# Patient Record
Sex: Female | Born: 1935 | Race: White | Hispanic: No | Smoking: Never smoker
Health system: Southern US, Community
[De-identification: ages and names within clinical notes are randomized; demographics above are authoritative.]

## PROBLEM LIST (undated history)

## (undated) DIAGNOSIS — F418 Other specified anxiety disorders: Secondary | ICD-10-CM

## (undated) DIAGNOSIS — D649 Anemia, unspecified: Secondary | ICD-10-CM

## (undated) DIAGNOSIS — C801 Malignant (primary) neoplasm, unspecified: Secondary | ICD-10-CM

## (undated) DIAGNOSIS — I10 Essential (primary) hypertension: Secondary | ICD-10-CM

## (undated) DIAGNOSIS — D62 Acute posthemorrhagic anemia: Secondary | ICD-10-CM

## (undated) DIAGNOSIS — E7800 Pure hypercholesterolemia, unspecified: Secondary | ICD-10-CM

## (undated) DIAGNOSIS — I351 Nonrheumatic aortic (valve) insufficiency: Secondary | ICD-10-CM

## (undated) DIAGNOSIS — Z952 Presence of prosthetic heart valve: Principal | ICD-10-CM

## (undated) DIAGNOSIS — I712 Thoracic aortic aneurysm, without rupture, unspecified: Secondary | ICD-10-CM

## (undated) DIAGNOSIS — D696 Thrombocytopenia, unspecified: Secondary | ICD-10-CM

## (undated) DIAGNOSIS — I480 Paroxysmal atrial fibrillation: Secondary | ICD-10-CM

## (undated) HISTORY — DX: Thrombocytopenia, unspecified: D69.6

## (undated) HISTORY — DX: Presence of prosthetic heart valve: Z95.2

## (undated) HISTORY — DX: Other specified anxiety disorders: F41.8

## (undated) HISTORY — DX: Pure hypercholesterolemia, unspecified: E78.00

## (undated) HISTORY — DX: Anemia, unspecified: D64.9

## (undated) HISTORY — DX: Malignant (primary) neoplasm, unspecified: C80.1

## (undated) HISTORY — DX: Thoracic aortic aneurysm, without rupture, unspecified: I71.20

## (undated) HISTORY — DX: Essential (primary) hypertension: I10

## (undated) HISTORY — DX: Acute posthemorrhagic anemia: D62

## (undated) HISTORY — DX: Nonrheumatic aortic (valve) insufficiency: I35.1

## (undated) HISTORY — DX: Paroxysmal atrial fibrillation: I48.0

## (undated) HISTORY — DX: Thoracic aortic aneurysm, without rupture: I71.2

## (undated) HISTORY — PX: ROTATOR CUFF REPAIR: SHX139

---

## 1971-10-13 HISTORY — PX: COLON SURGERY: SHX602

## 1998-04-09 ENCOUNTER — Observation Stay (HOSPITAL_COMMUNITY): Admission: RE | Admit: 1998-04-09 | Discharge: 1998-04-10 | Payer: Self-pay | Admitting: Specialist

## 1999-07-28 ENCOUNTER — Ambulatory Visit: Admission: RE | Admit: 1999-07-28 | Discharge: 1999-07-28 | Payer: Self-pay | Admitting: Rheumatology

## 1999-09-05 ENCOUNTER — Encounter: Admission: RE | Admit: 1999-09-05 | Discharge: 1999-09-05 | Payer: Self-pay | Admitting: Rheumatology

## 1999-09-05 ENCOUNTER — Encounter: Payer: Self-pay | Admitting: Rheumatology

## 2000-12-08 ENCOUNTER — Encounter: Payer: Self-pay | Admitting: Internal Medicine

## 2000-12-08 ENCOUNTER — Encounter: Admission: RE | Admit: 2000-12-08 | Discharge: 2000-12-08 | Payer: Self-pay | Admitting: Internal Medicine

## 2000-12-24 ENCOUNTER — Encounter: Admission: RE | Admit: 2000-12-24 | Discharge: 2001-02-01 | Payer: Self-pay | Admitting: Orthopedic Surgery

## 2002-08-15 ENCOUNTER — Encounter: Payer: Self-pay | Admitting: Internal Medicine

## 2002-08-15 ENCOUNTER — Encounter: Admission: RE | Admit: 2002-08-15 | Discharge: 2002-08-15 | Payer: Self-pay | Admitting: Internal Medicine

## 2003-02-06 ENCOUNTER — Encounter: Admission: RE | Admit: 2003-02-06 | Discharge: 2003-02-06 | Payer: Self-pay | Admitting: Internal Medicine

## 2003-02-06 ENCOUNTER — Encounter: Payer: Self-pay | Admitting: Internal Medicine

## 2003-08-14 ENCOUNTER — Encounter: Admission: RE | Admit: 2003-08-14 | Discharge: 2003-08-14 | Payer: Self-pay | Admitting: Internal Medicine

## 2003-11-05 ENCOUNTER — Encounter: Admission: RE | Admit: 2003-11-05 | Discharge: 2003-11-05 | Payer: Self-pay | Admitting: Psychiatry

## 2004-08-19 ENCOUNTER — Ambulatory Visit: Payer: Self-pay | Admitting: Hematology & Oncology

## 2005-02-12 ENCOUNTER — Encounter: Admission: RE | Admit: 2005-02-12 | Discharge: 2005-02-12 | Payer: Self-pay | Admitting: Internal Medicine

## 2006-01-19 ENCOUNTER — Encounter: Admission: RE | Admit: 2006-01-19 | Discharge: 2006-01-19 | Payer: Self-pay | Admitting: Internal Medicine

## 2006-09-27 ENCOUNTER — Encounter: Admission: RE | Admit: 2006-09-27 | Discharge: 2006-09-27 | Payer: Self-pay | Admitting: Gastroenterology

## 2006-12-23 ENCOUNTER — Encounter: Admission: RE | Admit: 2006-12-23 | Discharge: 2006-12-23 | Payer: Self-pay | Admitting: Gastroenterology

## 2009-02-28 ENCOUNTER — Ambulatory Visit (HOSPITAL_COMMUNITY): Admission: RE | Admit: 2009-02-28 | Discharge: 2009-02-28 | Payer: Self-pay | Admitting: *Deleted

## 2009-02-28 ENCOUNTER — Encounter (INDEPENDENT_AMBULATORY_CARE_PROVIDER_SITE_OTHER): Payer: Self-pay | Admitting: *Deleted

## 2009-03-04 ENCOUNTER — Ambulatory Visit (HOSPITAL_COMMUNITY): Admission: RE | Admit: 2009-03-04 | Discharge: 2009-03-04 | Payer: Self-pay | Admitting: Cardiology

## 2009-03-05 ENCOUNTER — Ambulatory Visit: Payer: Self-pay | Admitting: Surgery

## 2009-03-18 ENCOUNTER — Encounter: Payer: Self-pay | Admitting: Surgery

## 2009-03-20 ENCOUNTER — Ambulatory Visit: Payer: Self-pay | Admitting: Surgery

## 2009-03-20 ENCOUNTER — Encounter: Payer: Self-pay | Admitting: Surgery

## 2009-03-20 ENCOUNTER — Inpatient Hospital Stay (HOSPITAL_COMMUNITY): Admission: RE | Admit: 2009-03-20 | Discharge: 2009-03-27 | Payer: Self-pay | Admitting: Surgery

## 2009-03-20 HISTORY — PX: AORTIC VALVE REPLACEMENT: SHX41

## 2009-04-02 ENCOUNTER — Ambulatory Visit: Payer: Self-pay | Admitting: Thoracic Surgery (Cardiothoracic Vascular Surgery)

## 2009-04-22 ENCOUNTER — Encounter: Admission: RE | Admit: 2009-04-22 | Discharge: 2009-04-22 | Payer: Self-pay | Admitting: Surgery

## 2009-04-22 ENCOUNTER — Ambulatory Visit: Payer: Self-pay | Admitting: Surgery

## 2009-05-27 ENCOUNTER — Encounter (HOSPITAL_COMMUNITY): Admission: RE | Admit: 2009-05-27 | Discharge: 2009-08-25 | Payer: Self-pay | Admitting: *Deleted

## 2009-08-26 ENCOUNTER — Encounter (HOSPITAL_COMMUNITY): Admission: RE | Admit: 2009-08-26 | Discharge: 2009-10-08 | Payer: Self-pay | Admitting: *Deleted

## 2010-04-18 IMAGING — CR DG CHEST 1V PORT
1 series · 1 of 1 positions shown · non-contrast
Comparison: Chest radiograph 03/20/2009 at 7142 hours

CLINICAL DATA: Ascending aortic aneurysm

PORTABLE CHEST - 1 VIEW

[AP]
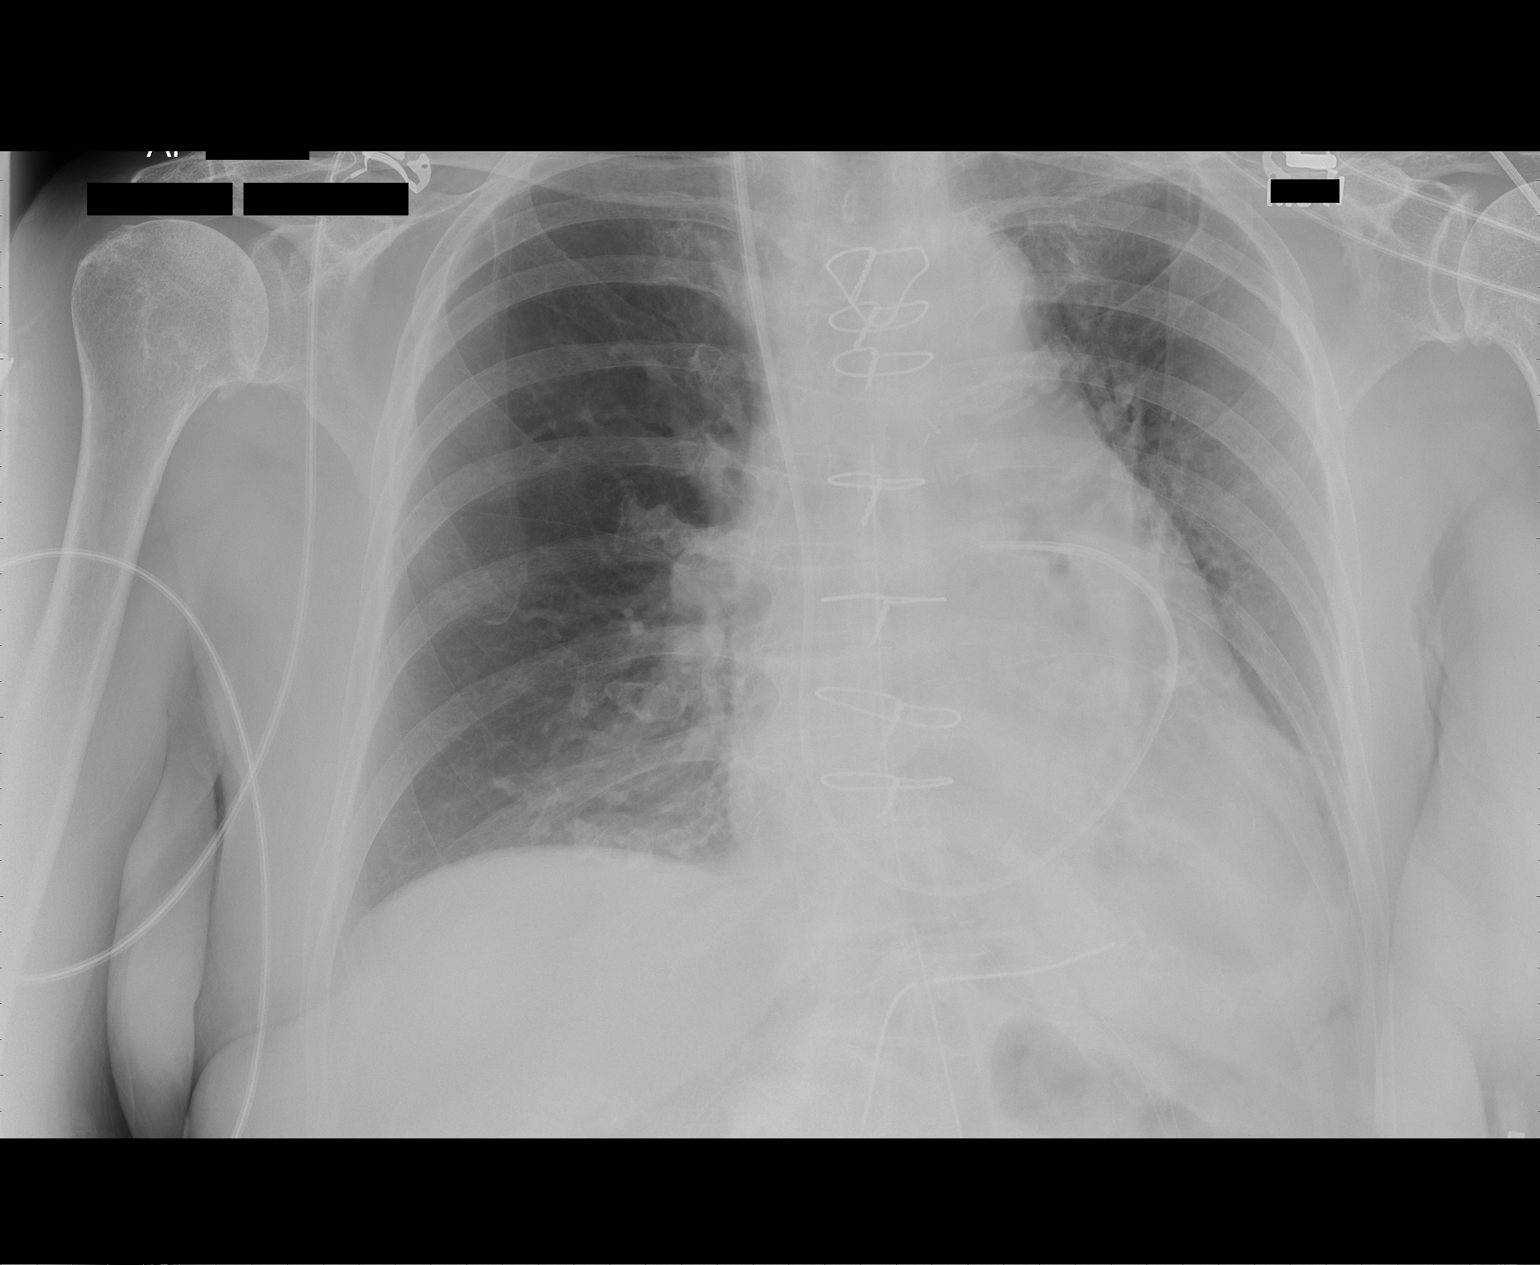

[1 of 1 positions shown; findings below may reference images not displayed]

FINDINGS: Interval removal of endotracheal tube with no increase in
atelectasis.  Swan-Ganz catheter remains with tip in the right main
pulmonary artery.  Mediastinal drains in place.  No evidence
pneumothorax.  No pulmonary edema.  Left basilar atelectasis.
IMPRESSION: Interval extubation with no increase in atelectasis.

## 2010-07-21 ENCOUNTER — Encounter: Admission: RE | Admit: 2010-07-21 | Discharge: 2010-07-21 | Payer: Self-pay | Admitting: Internal Medicine

## 2010-07-30 ENCOUNTER — Ambulatory Visit: Payer: Self-pay | Admitting: Cardiology

## 2010-08-15 ENCOUNTER — Encounter: Payer: Self-pay | Admitting: Cardiology

## 2010-08-15 ENCOUNTER — Ambulatory Visit (HOSPITAL_COMMUNITY): Admission: RE | Admit: 2010-08-15 | Discharge: 2010-08-15 | Payer: Self-pay | Admitting: Cardiology

## 2010-08-15 ENCOUNTER — Ambulatory Visit: Payer: Self-pay

## 2010-08-15 ENCOUNTER — Ambulatory Visit: Payer: Self-pay | Admitting: Internal Medicine

## 2010-09-16 ENCOUNTER — Ambulatory Visit (HOSPITAL_COMMUNITY)
Admission: RE | Admit: 2010-09-16 | Discharge: 2010-09-16 | Payer: Self-pay | Source: Home / Self Care | Admitting: Gastroenterology

## 2010-10-30 ENCOUNTER — Encounter
Admission: RE | Admit: 2010-10-30 | Discharge: 2010-10-30 | Payer: Self-pay | Source: Home / Self Care | Attending: Internal Medicine | Admitting: Internal Medicine

## 2010-11-01 ENCOUNTER — Encounter: Payer: Self-pay | Admitting: Internal Medicine

## 2010-11-02 ENCOUNTER — Encounter: Payer: Self-pay | Admitting: Internal Medicine

## 2010-11-11 ENCOUNTER — Other Ambulatory Visit: Payer: Self-pay | Admitting: Interventional Radiology

## 2010-11-11 ENCOUNTER — Encounter
Admission: RE | Admit: 2010-11-11 | Discharge: 2010-11-11 | Payer: Self-pay | Source: Home / Self Care | Attending: Internal Medicine | Admitting: Internal Medicine

## 2010-11-11 ENCOUNTER — Other Ambulatory Visit (HOSPITAL_COMMUNITY)
Admission: RE | Admit: 2010-11-11 | Payer: Medicare Other | Source: Ambulatory Visit | Admitting: Interventional Radiology

## 2010-11-11 ENCOUNTER — Other Ambulatory Visit (HOSPITAL_COMMUNITY)
Admission: RE | Admit: 2010-11-11 | Discharge: 2010-11-11 | Disposition: A | Discharge status: Home / Self Care | Payer: Medicare Other | Source: Ambulatory Visit | Attending: Interventional Radiology | Admitting: Interventional Radiology

## 2010-11-11 DIAGNOSIS — E049 Nontoxic goiter, unspecified: Secondary | ICD-10-CM | POA: Insufficient documentation

## 2011-01-19 LAB — TYPE AND SCREEN
ABO/RH(D): A POS
Antibody Screen: NEGATIVE

## 2011-01-19 LAB — BASIC METABOLIC PANEL
BUN: 12 mg/dL (ref 6–23)
BUN: 15 mg/dL (ref 6–23)
CO2: 28 mEq/L (ref 19–32)
CO2: 27 mEq/L (ref 19–32)
Calcium: 8.5 mg/dL (ref 8.4–10.5)
Calcium: 7.7 mg/dL — ABNORMAL LOW (ref 8.4–10.5)
Calcium: 8.1 mg/dL — ABNORMAL LOW (ref 8.4–10.5)
Calcium: 8.5 mg/dL (ref 8.4–10.5)
Chloride: 108 mEq/L (ref 96–112)
Chloride: 101 mEq/L (ref 96–112)
Creatinine, Ser: 0.86 mg/dL (ref 0.4–1.2)
Creatinine, Ser: 0.71 mg/dL (ref 0.4–1.2)
Creatinine, Ser: 0.75 mg/dL (ref 0.4–1.2)
GFR calc Af Amer: >60 mL/min (ref >60)
GFR calc non Af Amer: >60 mL/min (ref >60)
GFR calc non Af Amer: >60 mL/min (ref >60)
GFR calc non Af Amer: >60 mL/min (ref >60)
Glucose, Bld: 110 mg/dL — ABNORMAL HIGH (ref 70–99)
Glucose, Bld: 119 mg/dL — ABNORMAL HIGH (ref 70–99)
Glucose, Bld: 125 mg/dL — ABNORMAL HIGH (ref 70–99)
Glucose, Bld: 153 mg/dL — ABNORMAL HIGH (ref 70–99)
Potassium: 3.8 mEq/L (ref 3.5–5.1)
Sodium: 140 mEq/L (ref 135–145)

## 2011-01-19 LAB — BLOOD GAS, ARTERIAL
Bicarbonate: 24.8 mEq/L — ABNORMAL HIGH (ref 20.0–24.0)
FIO2: 0.21 %
TCO2: 26.1 mmol/L (ref 0–100)
pCO2 arterial: 41 mmHg (ref 35.0–45.0)
pO2, Arterial: 81.6 mmHg (ref 80.0–100.0)

## 2011-01-19 LAB — POCT I-STAT 4, (NA,K, GLUC, HGB,HCT)
Glucose, Bld: 119 mg/dL — ABNORMAL HIGH (ref 70–99)
Glucose, Bld: 122 mg/dL — ABNORMAL HIGH (ref 70–99)
Glucose, Bld: 148 mg/dL — ABNORMAL HIGH (ref 70–99)
Glucose, Bld: 148 mg/dL — ABNORMAL HIGH (ref 70–99)
Glucose, Bld: 93 mg/dL (ref 70–99)
Glucose, Bld: 96 mg/dL (ref 70–99)
HCT: 20 % — ABNORMAL LOW (ref 36.0–46.0)
HCT: 20 % — ABNORMAL LOW (ref 36.0–46.0)
HCT: 21 % — ABNORMAL LOW (ref 36.0–46.0)
HCT: 23 % — ABNORMAL LOW (ref 36.0–46.0)
HCT: 24 % — ABNORMAL LOW (ref 36.0–46.0)
HCT: 25 % — ABNORMAL LOW (ref 36.0–46.0)
HCT: 30 % — ABNORMAL LOW (ref 36.0–46.0)
HCT: 31 % — ABNORMAL LOW (ref 36.0–46.0)
HCT: 32 % — ABNORMAL LOW (ref 36.0–46.0)
Hemoglobin: 10.5 g/dL — ABNORMAL LOW (ref 12.0–15.0)
Hemoglobin: 6.8 g/dL — CL (ref 12.0–15.0)
Hemoglobin: 7.1 g/dL — CL (ref 12.0–15.0)
Hemoglobin: 7.1 g/dL — CL (ref 12.0–15.0)
Hemoglobin: 7.8 g/dL — CL (ref 12.0–15.0)
Hemoglobin: 8.2 g/dL — ABNORMAL LOW (ref 12.0–15.0)
Hemoglobin: 8.5 g/dL — ABNORMAL LOW (ref 12.0–15.0)
Potassium: 3.4 mEq/L — ABNORMAL LOW (ref 3.5–5.1)
Potassium: 3.4 mEq/L — ABNORMAL LOW (ref 3.5–5.1)
Potassium: 3.5 mEq/L (ref 3.5–5.1)
Potassium: 3.8 mEq/L (ref 3.5–5.1)
Potassium: 3.9 mEq/L (ref 3.5–5.1)
Potassium: 4 mEq/L (ref 3.5–5.1)
Potassium: 4.3 mEq/L (ref 3.5–5.1)
Sodium: 135 mEq/L (ref 135–145)
Sodium: 138 mEq/L (ref 135–145)
Sodium: 139 mEq/L (ref 135–145)
Sodium: 141 mEq/L (ref 135–145)
Sodium: 143 mEq/L (ref 135–145)
Sodium: 143 mEq/L (ref 135–145)
Sodium: 144 mEq/L (ref 135–145)

## 2011-01-19 LAB — CBC
HCT: 27.4 % — ABNORMAL LOW (ref 36.0–46.0)
HCT: 28.8 % — ABNORMAL LOW (ref 36.0–46.0)
Hemoglobin: 8.7 g/dL — ABNORMAL LOW (ref 12.0–15.0)
Hemoglobin: 8.9 g/dL — ABNORMAL LOW (ref 12.0–15.0)
Hemoglobin: 9.4 g/dL — ABNORMAL LOW (ref 12.0–15.0)
MCHC: 33.5 g/dL (ref 30.0–36.0)
MCHC: 33.6 g/dL (ref 30.0–36.0)
MCHC: 33.8 g/dL (ref 30.0–36.0)
MCV: 85.3 fL (ref 78.0–100.0)
MCV: 86.2 fL (ref 78.0–100.0)
Platelets: 238 10*3/uL (ref 150–400)
Platelets: 123 10*3/uL — ABNORMAL LOW (ref 150–400)
Platelets: 95 10*3/uL — ABNORMAL LOW (ref 150–400)
Platelets: 98 10*3/uL — ABNORMAL LOW (ref 150–400)
RBC: 3.09 MIL/uL — ABNORMAL LOW (ref 3.87–5.11)
RBC: 2.98 MIL/uL — ABNORMAL LOW (ref 3.87–5.11)
RBC: 3.26 MIL/uL — ABNORMAL LOW (ref 3.87–5.11)
RBC: 4.15 MIL/uL (ref 3.87–5.11)
RDW: 14.2 % (ref 11.5–15.5)
RDW: 13.9 % (ref 11.5–15.5)
RDW: 13.9 % (ref 11.5–15.5)
RDW: 14.2 % (ref 11.5–15.5)
RDW: 14.7 % (ref 11.5–15.5)
WBC: 10.6 10*3/uL — ABNORMAL HIGH (ref 4.0–10.5)
WBC: 5 10*3/uL (ref 4.0–10.5)
WBC: 7.1 10*3/uL (ref 4.0–10.5)

## 2011-01-19 LAB — POCT I-STAT 3, ART BLOOD GAS (G3+)
Acid-Base Excess: 1 mmol/L (ref 0.0–2.0)
Acid-base deficit: 3 mmol/L — ABNORMAL HIGH (ref 0.0–2.0)
Bicarbonate: 21.4 mEq/L (ref 20.0–24.0)
Bicarbonate: 24.4 mEq/L — ABNORMAL HIGH (ref 20.0–24.0)
O2 Saturation: 98 %
Patient temperature: 36.3
TCO2: 25 mmol/L (ref 0–100)
TCO2: 27 mmol/L (ref 0–100)
pCO2 arterial: 39.5 mmHg (ref 35.0–45.0)
pCO2 arterial: 41.8 mmHg (ref 35.0–45.0)
pCO2 arterial: 46.3 mmHg — ABNORMAL HIGH (ref 35.0–45.0)
pH, Arterial: 7.345 — ABNORMAL LOW (ref 7.350–7.400)
pH, Arterial: 7.351 (ref 7.350–7.400)
pH, Arterial: 7.358 (ref 7.350–7.400)
pH, Arterial: 7.378 (ref 7.350–7.400)
pH, Arterial: 7.437 — ABNORMAL HIGH (ref 7.350–7.400)
pO2, Arterial: 110 mmHg — ABNORMAL HIGH (ref 80.0–100.0)
pO2, Arterial: 110 mmHg — ABNORMAL HIGH (ref 80.0–100.0)
pO2, Arterial: 270 mmHg — ABNORMAL HIGH (ref 80.0–100.0)
pO2, Arterial: 272 mmHg — ABNORMAL HIGH (ref 80.0–100.0)

## 2011-01-19 LAB — URINE CULTURE
Colony Count: 50000
Special Requests: NEGATIVE

## 2011-01-19 LAB — URINALYSIS, ROUTINE W REFLEX MICROSCOPIC
Glucose, UA: NEGATIVE mg/dL
Hgb urine dipstick: NEGATIVE
Specific Gravity, Urine: 1.012 (ref 1.005–1.030)
Urobilinogen, UA: 0.2 mg/dL (ref 0.0–1.0)

## 2011-01-19 LAB — COMPREHENSIVE METABOLIC PANEL
ALT: 15 U/L (ref 0–35)
Alkaline Phosphatase: 68 U/L (ref 39–117)
Calcium: 9.5 mg/dL (ref 8.4–10.5)
Creatinine, Ser: 0.71 mg/dL (ref 0.4–1.2)
Glucose, Bld: 112 mg/dL — ABNORMAL HIGH (ref 70–99)
Potassium: 4 mEq/L (ref 3.5–5.1)
Sodium: 142 mEq/L (ref 135–145)
Total Protein: 6.7 g/dL (ref 6.0–8.3)

## 2011-01-19 LAB — POCT I-STAT, CHEM 8
Calcium, Ion: 1.08 mmol/L — ABNORMAL LOW (ref 1.12–1.32)
Glucose, Bld: 149 mg/dL — ABNORMAL HIGH (ref 70–99)
HCT: 30 % — ABNORMAL LOW (ref 36.0–46.0)
Hemoglobin: 10.2 g/dL — ABNORMAL LOW (ref 12.0–15.0)
TCO2: 19 mmol/L (ref 0–100)

## 2011-01-19 LAB — PROTIME-INR
INR: 1 (ref 0.00–1.49)
INR: 1.7 — ABNORMAL HIGH (ref 0.00–1.49)
Prothrombin Time: 13.2 seconds (ref 11.6–15.2)
Prothrombin Time: 21 seconds — ABNORMAL HIGH (ref 11.6–15.2)

## 2011-01-19 LAB — CREATININE, SERUM
Creatinine, Ser: 0.69 mg/dL (ref 0.4–1.2)
GFR calc non Af Amer: >60 mL/min (ref >60)

## 2011-01-19 LAB — URINALYSIS, MICROSCOPIC ONLY
Bilirubin Urine: NEGATIVE
Ketones, ur: NEGATIVE mg/dL
Nitrite: NEGATIVE
Protein, ur: NEGATIVE mg/dL
Urobilinogen, UA: 0.2 mg/dL (ref 0.0–1.0)

## 2011-01-19 LAB — POCT I-STAT 3, VENOUS BLOOD GAS (G3P V)
Bicarbonate: 23.7 mEq/L (ref 20.0–24.0)
pCO2, Ven: 44.5 mmHg — ABNORMAL LOW (ref 45.0–50.0)
pH, Ven: 7.335 — ABNORMAL HIGH (ref 7.250–7.300)
pO2, Ven: 123 mmHg — ABNORMAL HIGH (ref 30.0–45.0)

## 2011-01-19 LAB — GLUCOSE, CAPILLARY
Glucose-Capillary: 104 mg/dL — ABNORMAL HIGH (ref 70–99)
Glucose-Capillary: 89 mg/dL (ref 70–99)

## 2011-01-19 LAB — URINE MICROSCOPIC-ADD ON

## 2011-01-19 LAB — HEMOGLOBIN AND HEMATOCRIT, BLOOD: Hemoglobin: 7.2 g/dL — CL (ref 12.0–15.0)

## 2011-01-19 LAB — HEMOGLOBIN A1C
Hgb A1c MFr Bld: 5.8 % (ref 4.6–6.1)
Mean Plasma Glucose: 120 mg/dL

## 2011-01-19 LAB — ABO/RH: ABO/RH(D): A POS

## 2011-01-19 LAB — APTT: aPTT: 49 seconds — ABNORMAL HIGH (ref 24–37)

## 2011-01-19 LAB — PLATELET COUNT: Platelets: 143 10*3/uL — ABNORMAL LOW (ref 150–400)

## 2011-02-24 NOTE — Assessment & Plan Note (Signed)
OFFICE VISIT   [name], [name] MYREN  DOB:  15-Dec-1929                                        April 02, 2009  CHART #:  [id number]   The patient is a 77 year old woman, who underwent aortic root  replacement with a Medtronic Freestyle aortic root and replacement of an  ascending aneurysm with a Hemashield graft on March 20, 2009.  She had  some atrial fibrillation and was discharged home on amiodarone,  otherwise had a relatively unremarkable postoperative course.  Her home  health nurse today noted swelling and redness on her left forearm from  an IV site and she presents for evaluation.  She said she had not really  noticed it.  She denies any fevers, chills, or sweats but this is  somewhat tender to palpation.   PHYSICAL EXAMINATION:  GENERAL:  The patient is a 77 year old woman in  no acute distress.  VITAL SIGNS:  Blood pressure is 138/60, pulse 75, respirations are 18,  and oxygen saturation is 96% on room air.  Temperature is 97.9.  EXTREMITIES:  On the radial aspect of her left forearm, there is  approximately a 2 cm raised swollen area.  It is not frankly fluctuant.  There is some surrounding erythema.  It is mildly tender.   IMPRESSION:  Infected thrombophlebitis at an intravenous site from a  previous hospitalization.  We are going to start her on Keflex 500 mg  p.o. t.i.d. for 10 days.  She was instructed to take all of the  antibiotics even if the redness and swelling disappear before the course  has been completed.  She does know to call if there is any increasing  swelling, redness, or tenderness and also to call if there is no  improvement over the next 48-72 hours.  There is nothing to incise and  drain at this point in time, but the question is that it could  eventually require that.  I did advise her to call immediately if she  had any fevers or chills.  In addition to the antibiotics, she will do  warm compresses 3 times a day.  If this  resolves without further issues,  she will followup her  regular scheduled appointment with Dr. Laneta Simmers.  If she has any  additional issues, she will contact us in the meantime.   Salvatore Decent Dorris Fetch, M.D.  Electronically Signed   SCH/MEDQ  D:  04/02/2009  T:  04/03/2009  Job:  [id number]   cc:   Evelene Croon, M.D.  Elmore Guise., M.D.  Soyla Murphy. Renne Crigler, M.D.

## 2011-02-24 NOTE — Assessment & Plan Note (Signed)
OFFICE VISIT   [name], [name] [name]  DOB:  10/21/1929                                        April 22, 2009  CHART #:  [id number]   HISTORY:  The patient is seen in routine postoperative office visit  following her replacement of the aortic valve and aortic root using a 21-  mm Medtronic Freestyle Aortic Root Heart Valve Prosthesis with  reimplantation of the coronary arteries, resection and grafting of  ascending aortic aneurysm using a 28-mm Hemashield tube graft.  This was  performed on 03/20/2009 by Dr. Laneta Simmers.  This was done for a 6-cm  ascending aortic aneurysm with severe aortic insufficiency.   Currently, she feels as though she is making a very steady progress,  although, she does admit to being somewhat frustrated, because she feels  fairly tired most of the time.  She did have an acute blood loss anemia  and has been taking iron, but this does cause some gastric discomfort.  She is not having any shortness of breath.  She denies any anginal  equivalents.  She has no palpitations.  She denies peripheral edema.  She has had no symptoms of presyncope or syncope.  She has not seen her  cardiologist, but is scheduled to see Dr. Reyes [name] on Friday of this week.  Her pain is under good control and she is not requiring any medications  at this time.  She did have some difficulty with phlebitis and was seen  in the office and given a course of antibiotics.  This has resolved  entirely.   CHEST X-RAY:  A chest x-ray was obtained on today's date.  It shows a  very small effusion in the left base with some associated atelectasis.   PHYSICAL EXAMINATION:  VITAL SIGNS:  Blood pressure is 137/71, pulse 73  and regular, respirations 18 and unlabored, and oxygen saturation is 96%  on room air.  GENERAL:  An elderly female in no acute distress.  CARDIAC:  Regular rate and rhythm without murmurs, gallops, or rubs.  PULMONARY:  Clear breath sounds throughout.  Incision  is well healed  without evidence of infection.  Her sternum is stable.  EXTREMITIES:  Significant varicosities, but no edema.   ASSESSMENT:  The patient is making very good overall progress from a  cardiac surgical view point.  I have discussed that she can take her  iron on an every other day basis and when she follows up with Dr.  Reyes [name], she may be able to stop this completely.  She can continue her  current cardiac rehab regimen and increasing ambulation.  She does not  at this time require any further cardiac surgical intervention.  We will  now see her again on an as needed basis for any issues that we can  assist with.   [name] [name], P.A.-C.   [name] [name]  D:  04/22/2009  T:  04/22/2009  Job:  [id number]   cc:   [name] [name]., M.D.  [name] [name], M.D.

## 2011-02-24 NOTE — Op Note (Signed)
NAME:  [name], [name]              ACCOUNT NO.:  [id number]   MEDICAL RECORD NO.:  [id number]          PATIENT TYPE:  INP   LOCATION:  2313                         FACILITY:  [facility]   PHYSICIAN:  Evelene Croon, M.D.     DATE OF BIRTH:  05-24-1930   DATE OF PROCEDURE:  03/20/2009  DATE OF DISCHARGE:                               OPERATIVE REPORT   PREOPERATIVE DIAGNOSIS:  A 6-cm ascending aortic aneurysm with severe  aortic insufficiency.   POSTOPERATIVE DIAGNOSIS:  A 6-cm ascending aortic aneurysm with severe  aortic insufficiency.   OPERATIVE PROCEDURE:  Median sternotomy, extracorporeal circulation,  replacement of aortic valve and aortic root using a 21-mm Medtronic  Freestyle aortic root heart valve prosthesis with reimplantation of the  coronary arteries, resection and grafting of ascending aortic aneurysm  using a 28-mm Hemashield tube graft.   ATTENDING SURGEON:  Evelene Croon, MD   ASSISTANT:  Stephanie Acre. Dasovich, PA   ANESTHESIA:  General endotracheal.   CLINICAL HISTORY:  This patient is a 77 year old woman with a known  history of aortic aneurysm and moderate aortic insufficiency followed by  serial echocardiograms.  Transesophageal echocardiogram on Feb 28, 2009  showed a dilated ascending aorta with mild aortic stenosis and moderate-  to-severe aortic insufficiency.  Left ventricular thickness was normal.  Left ventricular size and function were normal with an ejection fraction  of 55-60%.  There was no mitral regurgitation or stenosis.  There was  some question of linear density in the aortic root extending from the  aortic valve through the ascending aorta which was felt possibly be a  dissection.  The patient subsequently underwent a cardiac CT angiogram  on Mar 04, 2009 read by Dr. Peter Swaziland, which showed a 6-cm ascending  aortic aneurysm extending from just above the aortic valve level to the  distal ascending aorta just proximal to the innominate artery.   There  was mild dilatation of the descending aorta.  The coronary portion of  the scan clearly showed a coronary arteries and there was no calcium and  no evidence of stenosis.  She had a large left dominant system.  After  review of the studies and examination, the patient was felt that  surgical replacement of her aortic valve and ascending aortic aneurysm  was the best treatment to prevent further complications of aortic  dissection or rupture.  There was also felt to be important to replace  her aortic valve which was leaking severely.  I reviewed the CT coronary  angiogram with Dr. Swaziland and he felt that there was clearly no evidence  of coronary artery disease.  Given her large aneurysm, we decided not to  proceed with cardiac catheterization.  I discussed the operative  procedure with her and her family including use of a porcine aortic root  and heart valve.  We discussed alternatives, benefits, and risks  including but not limited to bleeding, blood transfusion, infection,  stroke, myocardial infarction, heart block requiring permanent  pacemaker, organ dysfunction, and death.  She understood all of this and  agreed to proceed.   OPERATIVE PROCEDURE:  The patient was taken to the operating room and  placed on the table in supine position.  After induction of general  endotracheal anesthesia, a Foley catheter was placed in the bladder  using a sterile technique.  Then the chest, abdomen, and both lower  extremities were prepped and draped in usual sterile manner.  A  transesophageal echocardiogram was performed by Anesthesiology and read  by Dr. Carlyle Basques.  This showed severe aortic insufficiency with a  large ascending aortic aneurysm.  Left ventricular function appeared  well preserved.  There was trivial mitral regurgitation.   Then, the chest was opened through a median sternotomy incision and the  pericardium opened in the midline.  Examination of the heart showed good   ventricular contractility.  The ascending aorta was markedly aneurysmal  with large bulge anteriorly that looked somewhat thinned out.  There was  no blood in the pericardium.  Then, we reevaluated the ascending aorta  using echocardiogram and there was no evidence of disease in the lumen  of the aorta.  Therefore, I decided to cannulate the ascending aorta.  The patient was heparinized when an adequate activated clotting time was  achieved.  The proximal ascending aorta was cannulated using 20-French  aortic cannula for arterial inflow.  Venous outflow was achieved using a  two-stage venous cannula through the right atrial appendage.  A  retrograde cardioplegic cannula was inserted through the right atrium  and coronary sinus.  A left ventricular vent was placed through the  right superior pulmonary vein.  The patient was then placed on  cardiopulmonary bypass and cooling begun to nasopharyngeal and bladder  temperature of about 19 degrees centigrade.  The distal coronary  arteries were identified.  There was no palpable plaque any of the  coronary arteries.  It took about 25 minutes to cool the temperature  down to 19 degrees centigrade.  Then, the patient's head was packed in  ice and the patient was given 1 g of Solu-Medrol and a dose of Pentothal  by Anesthesiology.  The head was placed in the deep Trendelenburg  position.  Then, a retrograde cerebroplegic cannula was inserted into  the superior vena cava through a pursestring suture.  The superior vena  cava was encircled with a tape.  The circulation was then stopped and  the venous volume emptied into the reservoir.  Cold blood retrograde  cardioplegia was begun.  Cerebroplegia was begun and the superior vena  cava tape tightened.  Then, the aortic cannula was removed from the  ascending aorta.  The ascending aorta was transected just proximal to  the takeoff of the innominate artery.  The aortic wall appeared normal  in this area  and had a normal diameter.  There was no atherosclerotic  disease within the aortic arch.  The ascending aorta was then retracted  all the way.  A 28-mm Hemashield tube graft with a 10-mm sidearm was  then chosen and cut to the appropriate length.  This was anastomosed in  end-to-end manner to the proximal aortic arch using continuous 3-0  Prolene suture with a felt strip to reinforce the anastomosis.  This  anastomosis was then lightly coated with CoSeal for hemostasis.  Then,  the crossclamp was placed just proximal to the sidearm graft and  circulation was restored.  The aortic arch was de-aired before  tightening the clamp.  Full circulation was then restored.  Total  circulatory arrest time was 20 minutes.  Then, the patient was  rewarmed  back towards 32 degrees centigrade.  During the remainder of the  procedure, doses of cold blood retrograde cardioplegia was given at 20-  minute intervals to maintain myocardial temperature around 10 degrees  centigrade.  Topical hypothermia with iced saline was used.  A  temperature probe was placed in the septum and an insulating pad in the  pericardium.   Then, the aortic aneurysm was opened longitudinally down towards the  aortic root.  The position of the right and left coronary ostia were  identified.  They were relatively close to the annulus, but I was able  to excise the coronary ostia as buttons of aortic wall without  difficulty leaving off aorta just above the annulus.  These coronary  buttons were retracted all the way taking care not to rotate them.  Examination of the native aortic valve showed that there were three  leaflets.  There was marked calcification and immobility of the  noncoronary leaflet and calcium appeared to extend down into the  anterior mitral valve leaflet and into the mitral annulus.  The native  valve was excised.  A rongeurs and an elevator were used to remove  calcium from the annulus along the noncoronary  portion.  Care was taken  to remove all particulate debris.  The left ventricle was irrigated with  iced saline solution.  Then, the aortic annulus was sized and a 21-mm  Medtronic Freestyle aortic root heart valve prosthesis was chosen.  This  had model number 995, serial number [id number].  Then, a series of 4-0  Ethibond simple sutures were placed around the aortic annulus.  This  required 31 sutures.  The sutures were then placed through the sewing  ring on the porcine prosthesis.  The porcine prosthesis was oriented, so  that the porcine left coronary stump was adjacent to the patient's right  coronary artery.  The porcine right coronary stump was rotated into the  patient noncoronary position and was suture ligated with a 4-0 Prolene  suture.  The prosthesis was then lowered into place and the sutures were  tied sequentially.  The prosthesis is seated well.  Then, the proximal  anastomosis was lightly covered with CoSeal for hemostasis.   Then, the left coronary button was cut to the appropriate size and  opening was made in the porcine aortic sinus for the anastomosis using  an 11-blade scalpel and then a 4.5-mm aortic punch.  Care was taken not  to injure the coronary leaflets.  Then, the left coronary button was  anastomosed to this opening in end-to-side manner using continuous 5-0  Prolene suture.  This anastomosis was lightly coated with CoSeal for  hemostasis.  Then, the porcine left coronary stump was amputated and the  opening in the sinus slightly enlarged.  Then, the patient's right  coronary button was cut to the appropriate size and anastomosed in end-  to-side manner using continuous 5-0 Prolene suture.  This anastomosis  was also lightly coated with CoSeal for hemostasis.  Then, the ascending  aortic graft was cut to the appropriate length and was anastomosed end-  to-end to the porcine root using continuous 3-0 Prolene suture.  CoSeal  was used to lightly coat the  anastomosis for hemostasis.  Then, a McGoon  needle was placed through a pursestring suture in the ascending aortic  graft.  This was used for de-airing.  The left side of the heart was de-  aired.  The patient was rewarmed to 37 degrees  centigrade.  After de-  airing maneuvers, the head was placed in the Trendelenburg position and  the crossclamp removed with time of 139 minutes.  There was spontaneous  return of ventricular fibrillation and the patient was defibrillated  into sinus rhythm.  The anastomoses all appeared hemostatic.  The left  ventricular vent was then removed as was a retrograde cardioplegic  cannula.  Two temporary right ventricular and right atrial pacing wires  were placed and brought out through the skin.  When the patient rewarmed  to 37 degrees centigrade, she was weaned from cardiopulmonary bypass on  no inotropic agents.  Total bypass time was 180 minutes.  Cardiac  function appeared excellent.  Transesophageal echocardiogram was  evaluated and showed a normal functioning porcine valve without stenosis  or insufficiency.  There was trivial mitral regurgitation as prebypass.  Left ventricular function appeared well preserved.  Protamine was then  given and the venous and aortic cannulas were removed.  The sidearm on  the ascending aortic graft was ligated with heavy silk tie and then  suture ligated with continuous 4-0 Prolene suture in two layers.  Hemostasis was achieved without difficulty.  Two chest tubes were placed  with tube in the posterior pericardium, one in the anterior mediastinum.  The pericardium was loosely reapproximated over the heart.  The sternum  was closed with #6 stainless steel wires.  The fascia was closed with  continuous #1 Vicryl suture.  Subcutaneous tissue was closed with  continuous 2-0 Vicryl and the skin with a 3-0 Vicryl subcuticular  closure.  The sponge, needles, and instrument counts were correct  according to the scrub nurse.   Dry sterile dressing was applied over the  incisions around the chest tubes with Pleur-Evac suction.  The patient  remained hemodynamically stable and transported to the SICU in guarded,  but stable condition.      Evelene Croon, M.D.  Electronically Signed     BB/MEDQ  D:  03/20/2009  T:  03/20/2009  Job:  [id number]   cc:   [facility]  Elmore Guise., M.D.

## 2011-02-24 NOTE — Discharge Summary (Signed)
NAME:  DEMIANA, CRUMBLEY              ACCOUNT NO.:  [id number]   MEDICAL RECORD NO.:  [id number]          PATIENT TYPE:  INP   LOCATION:  2006                         FACILITY:  [facility]   PHYSICIAN:  Evelene Croon, M.D.     DATE OF BIRTH:  1929-11-10   DATE OF ADMISSION:  03/20/2009  DATE OF DISCHARGE:  03/25/2009                               DISCHARGE SUMMARY   ADMITTING DIAGNOSES:  1. Ascending aortic aneurysm and moderate to severe aortic      insufficiency with mild aortic stenosis.  2. History of hypertension.  3. History of colon cancer.   DISCHARGE DIAGNOSES:  1. Ascending aortic aneurysm and moderate to severe aortic      insufficiency with mild aortic stenosis.  2. History of hypertension.  3. History of colon cancer.  4. Postoperative thrombocytopenia.  5. Postoperative acute blood loss anemia.   PROCEDURE:  Median sternotomy replacement of aortic valve and aortic  root using a 21-mm Medtronic Freestyle aortic root heart valve  prosthesis with reimplantation of the coronary arteries, resection and  grafting of the ascending aortic aneurysm using a 28-mm Hemashield tube  graft by Dr. Laneta Simmers on March 20, 2009.   HISTORY OF PRESENTING ILLNESS:  This is a 77 year old Caucasian female  with a past medical history of hypertension and a history of aortic  aneurysm with mild aortic insufficiency that has been followed by serial  echocardiograms.  Echocardiogram done on Feb 28, 2009, showed dilated  ascending aorta.  There was mild aortic stenosis and moderate to severe  AI.  Left ventricular wall thickness was normal and left ventricular  function was estimated be 55% to 60%.  There was no mitral valve  regurgitation or stenosis.  Patient was admitted to [facility] on March 20, 2009, to undergo the aforementioned procedure by Dr. Laneta Simmers.   BRIEF HOSPITAL COURSE STAY:  Patient was extubated without difficulty  the evening of surgery.  She remained afebrile and hemodynamically  stable.  A-line, Swan, and chest tubes were all removed on postop day  #1.  She was volume overloaded and diuresed accordingly.  She was  transferred from the intensive care unit to [facility] for further  convalescence.  She was found to have postoperative thrombocytopenia,  platelet count went as low as 92,000.  She was also found to have acute  blood loss anemia.  Last H and H was 8.7 and 25 respectively.  She did  not require any postoperative blood transfusion.  She was started on Nu-  Iron.  In addition, she did have leukocytosis up to 14,700.  A UA was  negative and her white count did normalize by postoperative day #3.  She  continued to improve such that by currently on postoperative day #4 she  has already been tolerating a diet.  She has had bowel movement.  She  had a T-max of 99.2 on March 23, 2009, later became afebrile.  Heart rate  80s to 90s.  BP 129/72.  O2 saturation 94% on room air.  Preoperative  weight 62 kg, today's weight down to 63.2 kg.  PHYSICAL EXAM:  CARDIOVASCULAR:  Regular rate and rhythm.  PULMONARY:  Clear to auscultation bilaterally.  No rales, wheezes, or  rhonchi.  ABDOMEN:  Soft, nontender.  Bowel sounds present.  EXTREMITIES:  No lower extremity edema wound.  Her sternal wound is  clean and dry.  Provided patient remains afebrile and hemodynamically  stable and placement has been arranged, she will be discharged on March 25, 2009.   LATEST LABORATORY STUDIES:  Are as follows:  CBC done March 23, 2009, H  and H 8.7 and 25.7 respectively, white count 9900, platelet count  92,000.  A CBC will be checked on March 25, 2009.  Last BMET done on March 22, 2009, potassium 4.4, BUN and creatinine were 17 and 0.82  respectively.  Last chest x-ray done on March 22, 2009, showed no  pneumothorax, hyperinflation of the lungs (consistent with COPD),  improvement of aeration with decreasing bibasilar atelectasis, as well  as small bilateral pleural effusions.   DISCHARGE  INSTRUCTIONS:  Are as follows:  Patient is not to drive or  lift more than 10 pounds.  She is to continue with her breathing  exercises daily.  She is to walk every day and increase her frequency  and duration as tolerates.  She is to remain on a low-fat, low-salt  diet.  She may shower.  She is to cleanse her wounds with mild soap and  water and call the office if any wound problems arise.   FOLLOWUP APPOINTMENTS:  Include:  1. Patient needs to contact Dr. Silva Bandy office for a followup      appointment in 1 to 2 weeks.  2. Patient has an appointment to see Dr. Laneta Simmers on April 22, 2009, at      1:15 p.m.  Prior to this office appointment, a chest x-ray will be      obtained.   DISCHARGE MEDICATIONS:  Include the following:  1. Lopressor 25 mg p.o. 2 times daily.  2. Enteric-coated aspirin 325 mg p.o. daily.  3. Nu-Iron 150 mg p.o. daily.  4. Ultram 50 mg 1 to 2 tablets every 4 to 6 hours as needed for pain.  5. It will be determined whether or not the patient requires      additional potassium and Lasix upon discharge.      Doree Fudge, [place]      Evelene Croon, M.D.  Electronically Signed    DZ/MEDQ  D:  03/24/2009  T:  03/24/2009  Job:  [id number]   cc:   Elmore Guise., M.D.  Evelene Croon, M.D.

## 2011-02-24 NOTE — Consult Note (Signed)
NEW PATIENT CONSULTATION   Mcgruder, Karime J  DOB:  [birth year]                                        Mar 05, 2009  CHART #:  [id number]   REFERRING PHYSICIAN:  Rosine Abe, MD   REASON FOR CONSULTATION:  Ascending aortic aneurysm and moderate-to-  severe aortic insufficiency with mild aortic stenosis.   CLINICAL HISTORY:  I was asked by Dr. Reyes Ivan to evaluate the patient for  consideration of surgical treatment for the above problem.  She is a 77-  year-old woman with a known history of aortic aneurysm and moderate  aortic insufficiency that is followed by serial echocardiogram.  She was  initially referred to Dr. Reyes Ivan in December 2009.  She subsequently  underwent a transesophageal echocardiogram on Feb 28, 2009.  This showed  dilated ascending aorta.  There is mild aortic stenosis and moderate-to-  severe aortic insufficiency.  Left ventricular wall thickness was  normal.  Left ventricular size and function were normal with ejection  fraction of 55%-60%.  There is no mitral valve regurgitation or  stenosis.  There is some question of a linear opacity in the aortic root  extending from the aortic valve through the ascending aorta.  There were  some question about whether this was an aortic dissection.  The patient  subsequently underwent a cardiac CT angiogram on Mar 04, 2009.  This  showed the ascending aortic aneurysm, which had a maximum diameter of  about 6 cm.  This appeared to come back to a more normal size in the  proximal aortic arch.  She also had some dilatation of her descending  aorta, which will require long-term followup.  The coronary portion of  the scan clearly showed the coronary arteries.  There were no stenoses  noted by Dr. [place]and.   REVIEW OF SYSTEMS:  GENERAL:  She denies any fever or chills.  She has  had no recent weight changes.  He does report some fatigue, which she  attributes to her Coreg.  She said she did not have any  fatigue prior to  that.  She is not very active.  EYES:  Negative.  ENT:  Negative.  ENDOCRINE:  She denies diabetes and hypothyroidism.  CARDIOVASCULAR:  She denies any chest pain or pressure.  She has had no  PND or orthopnea.  She denies peripheral edema and palpitations.  She  denies exertional dyspnea.  RESPIRATORY:  She denies cough and sputum production.  GI:  She has had no nausea or vomiting.  She denies melena and bright  red blood per rectum.  GU:  She denies dysuria and hematuria.  MUSCULOSKELETAL:  She does have a history of arthritis.  VASCULAR:  She denies claudication and phlebitis.  NEUROLOGICAL:  She denies any focal weakness or numbness.  She denies  dizziness and syncope.  She has never had a TIA or stroke.  PSYCHIATRIC:  She does have a history of anxiety and panic attacks as  well as depression.  HEMATOLOGIC:  She denies any history of bleeding disorders or easy  bleeding.   ALLERGIES:  None.   MEDICATIONS:  1. Amlodipine/benazepril 5/20 daily.  2. Carvedilol 25 mg b.i.d..   PAST MEDICAL HISTORY:  Significant for hypertension.  She has a history  of colon cancer surgery in the 1970s but did not require  chemotherapy.  She has a history of colonoscopy and polypectomy in the past.   SOCIAL HISTORY:  She is widowed.  She is retired.  She has never smoked  and denies alcohol abuse.   FAMILY HISTORY:  Negative for cardiac disease.  There is no history of  valvular disease or aortic aneurysm disease.  Her father died at age 44  of unknown causes.  Her mother died at age 42 with Parkinson disease.   PHYSICAL EXAMINATION:  Vital Signs:  Blood pressure is 159/68 and her  pulse is 73 and regular.  Respiratory rate is 18, unlabored.  Oxygen  saturation on room air is 95%.  General:  She is a well-developed  elderly white female in no distress.  HEENT:  Normocephalic and  atraumatic.  Pupils are equal and reactive to light and accommodation.  Extraocular muscles  are intact.  Throat is clear.  Neck:  Collapsing  carotid pulses bilaterally.  There is a transmitted murmur to both sides  of her neck.  There is no JVD.  Lungs:  Clear.  Cardiac:  A regular rate  and rhythm with a grade 3/6 systolic murmur and a diastolic aortic  insufficiency murmur.  Abdomen:  Active bowel sounds.  Abdomen is soft  and nontender.  There are no palpable masses or organomegaly.  Extremities:  No peripheral edema.  Pedal pulses are palpable  bilaterally.  There are bilateral varicose veins.  Neurologic:  Alert  and oriented x3.  Motor and sensory exams grossly normal.  Skin:  Warm  and dry.   IMPRESSION:  The patient has a 6-cm ascending aortic aneurysm as well as  moderate-to-severe aortic insufficiency and mild aortic stenosis.  She  has no evidence of coronary artery disease by coronary CT angiogram as  read by Dr. Peter [place].  I agree that the best treatment for her is  replacement of her aortic valve and ascending aortic aneurysm using a  valve/conduit.  Given her age of 3, I would plan on using a Medtronic  Freestyle porcine root.  This will alleviate the need for Coumadin in  this elderly patient, and I would expect that the valve would last for  the rest of her life.  I reduced a Dacron conduit to replace the  remainder of her ascending aorta.  This will require deep hypothermic  circulatory arrest to replace the under surface of her aortic arch.  I  discussed the operative procedure with her and her daughter.  We  discussed alternatives, benefits, and risks including but not limited to  bleeding, blood transfusion, infection, stroke, myocardial infarction,  organ failure, heart block requiring permanent pacemaker, and death.  They understand and would like to proceed with surgery.  She would like  to wait until after June 4 to do surgery, and she will call our office  later this week to schedule that.   Evelene Croon, M.D.  Electronically Signed   BB/MEDQ   D:  03/05/2009  T:  03/06/2009  Job:  [id number]   cc:   Elmore Guise., M.D.

## 2011-02-24 NOTE — Discharge Summary (Signed)
NAME:  [name] [name], [name] [name]              ACCOUNT NO.:  [id number]   MEDICAL RECORD NO.:  [id number]          PATIENT TYPE:  INP   LOCATION:  2006                         FACILITY:  [facility]   PHYSICIAN:  Evelene Croon, M.D.     DATE OF BIRTH:  [birth year]   DATE OF ADMISSION:  03/20/2009  DATE OF DISCHARGE:  03/27/2009                               DISCHARGE SUMMARY   ADDENDUM   The patient was tentatively ready for transfer to skilled nursing  facility once arrangements made.  On March 25, 2009, postop day #5, the  patient went into rapid atrial fibrillation with heart rate reaching in  the 190s.  She was started on IV amiodarone at that time.  The patient  was able to convert back to normal sinus rhythm.  She did have several  more runs of rapid atrial fibrillation over the next 24 hours.  She  continued on IV amiodarone.  This was eventually discontinued and  switched to p.o. amiodarone.  Dr. Reyes Ivan did see and evaluate the  patient, and recommended continuing p.o. amiodarone and increasing  Lopressor to t.i.d.  The patient has remained in normal sinus rhythm  since heart rate in the 70s.  During this time, the patient's blood  pressure remained stable.  She remained afebrile.  Case Management was  working on placement.  PT and OT did see and evaluate the patient and  felt that the patient was progressing extremely well.  She was not a  candidate for [facility].  She currently lives alone, but does  have a lady that stays with her at night from around 6 p.m. to 6 a.m.  She can go to skilled nursing facility for short term until she gets  stronger.  Due to her progressing extremely well and ambulating 800 feet  on her own, she was not a candidate for skilled nursing facility either.  Case Management discussed with the patient and the patient stated that  she can have her church friends and her neighbor check on her throughout  the day as well as arrange for home health nurse, PT, and  aide.  Her  friend will be with her at night.  The patient is currently felt to be  stable, March 27, 2009, and ready for discharge to home.  She again is in  normal sinus rhythm.   LABORATORY DATA:  Today shows a white count of 7.9, hemoglobin of 9.1,  hematocrit of 26.8, platelet count of 238.  Sodium of 140, potassium of  3.8, chloride of 108, bicarbonate of 20, BUN of 12, creatinine of 0.86,  glucose of 110.  The patient is sating greater than 90% on room air.  Blood pressure is stable.  She is afebrile.   For followup appointments and discharge instructions, please see  dictated H and P.   DISCHARGE MEDICATIONS:  1. Xanax 0.25 mg t.i.d.  2. Aspirin 325 mg daily.  3. Ultram 50 mg 1-2 tabs q.4-6 h. p.r.n.  4. Neurontin 150 mg daily.  5. Lopressor 25 mg t.i.d.  6. Amiodarone 400 mg b.i.d. x1 week and  200 mg b.i.d.      Stephanie Acre Dasovich, PA      Evelene Croon, M.D.  Electronically Signed    KMD/MEDQ  D:  03/27/2009  T:  03/28/2009  Job:  [id number]   cc:   Elmore Guise., M.D.

## 2011-03-05 ENCOUNTER — Encounter: Payer: Self-pay | Admitting: Cardiology

## 2011-03-06 ENCOUNTER — Ambulatory Visit (INDEPENDENT_AMBULATORY_CARE_PROVIDER_SITE_OTHER): Payer: Medicare Other | Admitting: Cardiology

## 2011-03-06 ENCOUNTER — Encounter: Payer: Self-pay | Admitting: Cardiology

## 2011-03-06 VITALS — BP 148/62 | HR 60 | Ht 63.0 in | Wt 137.5 lb

## 2011-03-06 DIAGNOSIS — I712 Thoracic aortic aneurysm, without rupture, unspecified: Secondary | ICD-10-CM

## 2011-03-06 DIAGNOSIS — I359 Nonrheumatic aortic valve disorder, unspecified: Secondary | ICD-10-CM

## 2011-03-06 DIAGNOSIS — I4891 Unspecified atrial fibrillation: Secondary | ICD-10-CM

## 2011-03-06 DIAGNOSIS — I480 Paroxysmal atrial fibrillation: Secondary | ICD-10-CM

## 2011-03-06 DIAGNOSIS — E7800 Pure hypercholesterolemia, unspecified: Secondary | ICD-10-CM

## 2011-03-06 DIAGNOSIS — I10 Essential (primary) hypertension: Secondary | ICD-10-CM

## 2011-03-06 DIAGNOSIS — I351 Nonrheumatic aortic (valve) insufficiency: Secondary | ICD-10-CM

## 2011-03-06 NOTE — Patient Instructions (Signed)
Continue your current medications.  Check your blood pressure at home periodically.  I will see you for a follow up visit in 6 months.

## 2011-03-06 NOTE — Progress Notes (Signed)
   [name] [name] Date of Birth: [birth year]   History of Present Illness: [name] [name] is seen today for followup. She does report that she gets short of breath with activity such as housework. She states it's not bad. She admits that she sits around too much and doesn't get any exercise. She states she did very well with the cardiac rehabilitation program before and is interested in the maintenance program. She denies any chest pain. She does not check her blood pressure at home because this makes her anxious.  Current Outpatient Prescriptions on File Prior to Visit  Medication Sig Dispense Refill  . ALPRAZolam (XANAX) 0.5 MG tablet Take 0.5 mg by mouth as needed.        Marland Kitchen amLODipine (NORVASC) 5 MG tablet Take 5 mg by mouth daily.        . benazepril-hydrochlorthiazide (LOTENSIN HCT) 20-12.5 MG per tablet Take 1 tablet by mouth daily.        . metoprolol (TOPROL-XL) 100 MG 24 hr tablet Take 100 mg by mouth daily.        . sertraline (ZOLOFT) 100 MG tablet Take 100 mg by mouth daily.        Marland Kitchen DISCONTD: benazepril (LOTENSIN) 20 MG tablet Take 20 mg by mouth daily.        Marland Kitchen DISCONTD: LORazepam (ATIVAN) 0.5 MG tablet Take 0.5 mg by mouth every 8 (eight) hours. 1 1/2 tablet daily          Allergies  Allergen Reactions  . Codeine     Past Medical History  Diagnosis Date  . Hypertension   . Anemia   . Hypercholesterolemia   . Cancer     colon  . Paroxysmal atrial fibrillation   . Depression with anxiety   . Aortic aneurysm, thoracic   . Aortic insufficiency     Past Surgical History  Procedure Date  . Colon surgery 1973    for colon cancer  . Aortic valve replacement 03/20/2009    mild aortic stenosis & mod to severe A1, lt verticular wall thickness was normal & lt ventricular function was estimated to be 55 to 60%,no mitral valve regugitation or stenosis    History  Smoking status  . Never Smoker   Smokeless tobacco  . Never Used    History  Alcohol Use No     Family History  Problem Relation Age of Onset  . Parkinsonism Mother   . Hypertension Father     Review of Systems: The review of systems is positive for dyspnea.  She has some chronic fatigue. She has severe varicosities in her lower extremities.All other systems were reviewed and are negative.  Physical Exam: BP 148/62  Pulse 60  Ht 5\' 3"  (1.6 m)  Wt 137 lb 8 oz (62.37 kg)  BMI 24.36 kg/m2 She is an elderly white female in no acute distress. HEENT exam is unremarkable. She has a left carotid bruit. There is no JVD. Lungs are clear. Cardiac exam reveals a regular rate and rhythm without gallop, murmur, or click. Abdomen is soft and nontender. She has no masses or bruits. Extremities are without edema. She does have a lot of superficial varicosities in her lower extremities. Pedal pulses are good. LABORATORY DATA: ECG demonstrates normal sinus rhythm with mild nonspecific ST-T wave abnormality.  Assessment / Plan:

## 2011-03-06 NOTE — Assessment & Plan Note (Signed)
Blood pressure is mildly elevated today despite therapy with benazepril HCT, Norvasc, and metoprolol. I have recommended that she take her medicines as prescribed and avoid sodium intake. I've asked that she monitor her blood pressure readings at home.

## 2011-03-06 NOTE — Assessment & Plan Note (Signed)
She is status post aortic valve replacement and aortic root repair with a #21 mm Medtronic freestyle graft and a #28 mm Hemashield tube graft in June of 2010. Her echocardiogram in November demonstrated excellent result. Her aortic valve prosthesis appeared normal. She had normal left ventricular function.

## 2011-03-06 NOTE — Assessment & Plan Note (Signed)
She is on appropriate stent therapy. Her last lipid panel in October 2011 looked excellent.

## 2011-03-16 ENCOUNTER — Other Ambulatory Visit: Payer: Self-pay | Admitting: Internal Medicine

## 2011-03-16 DIAGNOSIS — I6529 Occlusion and stenosis of unspecified carotid artery: Secondary | ICD-10-CM

## 2011-03-24 ENCOUNTER — Ambulatory Visit
Admission: RE | Admit: 2011-03-24 | Discharge: 2011-03-24 | Disposition: A | Discharge status: Home / Self Care | Payer: Medicare Other | Source: Ambulatory Visit | Attending: Internal Medicine | Admitting: Internal Medicine

## 2011-03-24 DIAGNOSIS — I6529 Occlusion and stenosis of unspecified carotid artery: Secondary | ICD-10-CM

## 2011-10-12 ENCOUNTER — Ambulatory Visit: Payer: Medicare Other | Admitting: Cardiology

## 2011-10-27 ENCOUNTER — Encounter: Payer: Self-pay | Admitting: Cardiology

## 2011-10-27 ENCOUNTER — Telehealth: Payer: Self-pay | Admitting: Cardiology

## 2011-10-27 ENCOUNTER — Ambulatory Visit (INDEPENDENT_AMBULATORY_CARE_PROVIDER_SITE_OTHER): Payer: Medicare Other | Admitting: Cardiology

## 2011-10-27 VITALS — BP 168/80 | HR 64 | Ht 64.0 in | Wt 136.6 lb

## 2011-10-27 DIAGNOSIS — I351 Nonrheumatic aortic (valve) insufficiency: Secondary | ICD-10-CM

## 2011-10-27 DIAGNOSIS — Z952 Presence of prosthetic heart valve: Secondary | ICD-10-CM

## 2011-10-27 DIAGNOSIS — I712 Thoracic aortic aneurysm, without rupture: Secondary | ICD-10-CM

## 2011-10-27 DIAGNOSIS — E7800 Pure hypercholesterolemia, unspecified: Secondary | ICD-10-CM

## 2011-10-27 DIAGNOSIS — I10 Essential (primary) hypertension: Secondary | ICD-10-CM

## 2011-10-27 DIAGNOSIS — Z954 Presence of other heart-valve replacement: Secondary | ICD-10-CM

## 2011-10-27 DIAGNOSIS — I359 Nonrheumatic aortic valve disorder, unspecified: Secondary | ICD-10-CM

## 2011-10-27 MED ORDER — NEBIVOLOL HCL 10 MG PO TABS: 10.0000 mg | ORAL_TABLET | Freq: Every day | ORAL | Status: DC

## 2011-10-27 NOTE — Telephone Encounter (Signed)
Patient called, stated she was calling to report name of medication to Dr.Jordan that was too expensive.States Raynelle Chary was the medication.Dr.Jordan was told.

## 2011-10-27 NOTE — Telephone Encounter (Signed)
New Problem  Patient would like a return call concerning Meds. Plz call home#

## 2011-10-27 NOTE — Assessment & Plan Note (Signed)
Blood pressure is significantly elevated today. It was previously recommended to switch her losartan to [place]. She states she cannot afford this. I will try switching her metoprolol to bystolic 10 mg per day. Her blood pressure still not adequately controlled we could increase this to 20 mg per day. I stressed the importance of sodium restriction.

## 2011-10-27 NOTE — Assessment & Plan Note (Signed)
Status post aortic grafting. Patient remains asymptomatic.

## 2011-10-27 NOTE — Patient Instructions (Signed)
Stop metoprolol.   Start Bystolic 10 mg daily. Monitor your blood pressure, if it is still running high we could increase this to 20 mg daily.  Take ASA 81 mg daily.  I will see you again in 6 months.

## 2011-10-27 NOTE — Assessment & Plan Note (Signed)
Status post bioprosthetic aortic valve replacement. Normal exam.

## 2011-10-27 NOTE — Progress Notes (Signed)
   [name] [name] Date of Birth: [birth year]   History of Present Illness: [name] [name] is seen today for followup. She reports that she is doing very well. She does not check her blood pressure at home because it makes her nervous. She's had no chest pain, shortness of breath, or palpitations.  Current Outpatient Prescriptions on File Prior to Visit  Medication Sig Dispense Refill  . ALPRAZolam (XANAX) 0.5 MG tablet Take 0.5 mg by mouth as needed.        . sertraline (ZOLOFT) 100 MG tablet Take 100 mg by mouth daily.          Allergies  Allergen Reactions  . Codeine     Past Medical History  Diagnosis Date  . Hypertension   . Anemia   . Hypercholesterolemia   . Cancer     colon  . Paroxysmal atrial fibrillation   . Depression with anxiety   . Aortic aneurysm, thoracic   . Aortic insufficiency   . Thrombocytopenia     Postoperative thrombocytopenia  . Acute blood loss anemia     Postoperative    Past Surgical History  Procedure Date  . Colon surgery 1973    for colon cancer  . Aortic valve replacement 03/20/2009    mild aortic stenosis & mod to severe A1, lt verticular wall thickness was normal & lt ventricular function was estimated to be 55 to 60%,no mitral valve regugitation or stenosis    History  Smoking status  . Never Smoker   Smokeless tobacco  . Never Used    History  Alcohol Use No    Family History  Problem Relation Age of Onset  . Parkinsonism Mother   . Hypertension Father     Review of Systems: The review of systems is positive for  chronic fatigue. She has chronic varicosities in her lower extremities.All other systems were reviewed and are negative.  Physical Exam: BP 168/80  Pulse 64  Ht 5\' 4"  (1.626 m)  Wt 136 lb 9.6 oz (61.961 kg)  BMI 23.45 kg/m2 She is an elderly white female in no acute distress. HEENT exam is unremarkable. She has a left carotid bruit. There is no JVD. Lungs are clear. Cardiac exam reveals a regular rate and  rhythm without gallop, murmur, or click. Abdomen is soft and nontender. She has no masses or bruits. Extremities are without edema. She does have a lot of superficial varicosities in her lower extremities. Pedal pulses are good. LABORATORY DATA:   Assessment / Plan:

## 2011-12-29 ENCOUNTER — Encounter (HOSPITAL_COMMUNITY): Payer: Self-pay | Admitting: *Deleted

## 2011-12-29 ENCOUNTER — Ambulatory Visit (HOSPITAL_COMMUNITY)
Admission: RE | Admit: 2011-12-29 | Discharge: 2011-12-29 | Disposition: A | Discharge status: Home Health | Payer: Medicare Other | Source: Ambulatory Visit | Attending: Gastroenterology | Admitting: Gastroenterology

## 2011-12-29 ENCOUNTER — Encounter (HOSPITAL_COMMUNITY)
Admission: RE | Disposition: A | Discharge status: Home Health | Payer: Self-pay | Source: Ambulatory Visit | Attending: Gastroenterology

## 2011-12-29 DIAGNOSIS — Z85038 Personal history of other malignant neoplasm of large intestine: Secondary | ICD-10-CM | POA: Insufficient documentation

## 2011-12-29 DIAGNOSIS — D126 Benign neoplasm of colon, unspecified: Secondary | ICD-10-CM | POA: Insufficient documentation

## 2011-12-29 HISTORY — PX: HOT HEMOSTASIS: SHX5433

## 2011-12-29 HISTORY — PX: COLONOSCOPY: SHX5424

## 2011-12-29 SURGERY — COLONOSCOPY
Anesthesia: Moderate Sedation

## 2011-12-29 MED ORDER — FENTANYL CITRATE 0.05 MG/ML IJ SOLN
INTRAMUSCULAR | Status: AC
  Filled 2011-12-29: qty 2

## 2011-12-29 MED ORDER — MIDAZOLAM HCL 10 MG/2ML IJ SOLN
INTRAMUSCULAR | Status: AC
  Filled 2011-12-29: qty 2

## 2011-12-29 MED ORDER — SODIUM CHLORIDE 0.9 % IV SOLN
Freq: Once | INTRAVENOUS | Status: AC
  Administered 2011-12-29: 09:00:00 via INTRAVENOUS

## 2011-12-29 MED ORDER — DIPHENHYDRAMINE HCL 50 MG/ML IJ SOLN
INTRAMUSCULAR | Status: DC | PRN
  Administered 2011-12-29: 50 mg via INTRAVENOUS

## 2011-12-29 MED ORDER — DIPHENHYDRAMINE HCL 50 MG/ML IJ SOLN
INTRAMUSCULAR | Status: AC
  Filled 2011-12-29: qty 1

## 2011-12-29 MED ORDER — MIDAZOLAM HCL 5 MG/5ML IJ SOLN
INTRAMUSCULAR | Status: DC | PRN
  Administered 2011-12-29: 1 mg via INTRAVENOUS
  Administered 2011-12-29 (×2): 2 mg via INTRAVENOUS

## 2011-12-29 MED ORDER — FENTANYL NICU IV SYRINGE 50 MCG/ML
INJECTION | INTRAMUSCULAR | Status: DC | PRN
  Administered 2011-12-29 (×3): 25 ug via INTRAVENOUS

## 2011-12-29 NOTE — H&P (Signed)
78 year old female for colonoscopy because of history of colon cancer and large proximal colonic polyps, status post repeated resections, most recently touched up one year ago.  Allergies: Codeine (nausea)  Medications: L. Gracelyn 0.5 mg 3 times a day Zoloft 100 mg daily metoprolol 100 mg daily losartan 100 mg daily  Operations colon cancer resection in 1978, aortic valve replacement,? CABG,? AAA repair 2010  Medical illnesses: Large adenomatous polyps of the proximal colon, history of colon cancer, coronary disease,? Paroxysmal A. fib, hypertension, constipation  Habits: Nonsmoker nondrinker  Physical exam: A thin, slightly anxious Caucasian female. Oropharynx benign. Chest clear. Heart normal. Abdomen without guarding, mass effect, or tenderness.  Impression recurrent proximal colonic polyps  Plan: Colonoscopy for excision of any residual polyp tissue.  Signed Katy Fitch Laurens Matheny M.D.

## 2011-12-29 NOTE — Op Note (Signed)
Documentation done in endoPro; see under Procedures tab.  

## 2011-12-29 NOTE — Discharge Instructions (Addendum)
  INSTRUCTIONS FOR PATIENT--READ CAREFULLY  POST-COLONOSCOPY WITH BIOPSY/POLYPECTOMY   Advance diet slowly to your normal diet.   Light activity today. Do not drive, operate machinery, make legal decisions, or do hazardous activities until tomorrow morning. Call my office day or night [phone] if you have abdominal pain, fever, or significant rectal bleeding (more than 1 tbsp).  Bleeding from polyp removal is rare, but can occur up to 2 weeks following your procedure. Your pathology results will be sent to you within 2 weeks; call us if you have not heard from [place] by then.

## 2011-12-29 NOTE — Op Note (Signed)
[facility] [address] [place], [place]  [address]  COLONOSCOPY PROCEDURE REPORT  PATIENT:  [name] [name], [name] [name]  MR#:  [id number] BIRTHDATE:  26-Nov-1929, 81 yrs. old  GENDER:  female ENDOSCOPIST:  Bernette Redbird, MD REF. BY:  Soyla Murphy. Renne Crigler, M.D. PROCEDURE DATE:  12/29/2011 PROCEDURE:  Colonoscopy with snare polypectomy, Colonoscopy with ablation ASA CLASS: INDICATIONS:  followup of recurring adenomatous polyp of the proximal colon, last treated one year ago MEDICATIONS:   Benadryl 50 mg IM, Fentanyl 75 mcg IV, Versed 5 mg IV  DESCRIPTION OF PROCEDURE:   After the risks and benefits and of the procedure were explained, informed consent was obtained.  No rectal exam performed. The EC-[id number] ([id number]) adult colonoscope was introduced through the anus and advanced to the cecum, which was identified by both the appendix and ileocecal valve. There was slight looping overcome by some external abdominal compression. The quality of the prep was excellent, using Trilyte.  The instrument was then slowly withdrawn as the colon was fully examined. <<PROCEDUREIMAGES>>  FINDINGS: There was evidence of a previous colon resection with anastomotic changes in the distal sigmoid region.  The main finding on this exam was a moderate amount of residual polyp tissue near the patient's tattoo above the cecum. It is felt that the main bulk of the polyp was approximately 1 cm in diameter, and it was raised about 5 mm. In addition, there was peripheral mucosal irregularity, consistent with superficial spreading polyp tissue, extending for a roughly 1 cm radius around the main polyp.   This lesion was injected with saline to help elevate it, after which a single snare excision of the main body of the polyp was accomplished using coagulation current, with minimal oozing of blood. Further attempts to snare adjacent tissue were unsuccessful because it was simply too flat in  character, so I then proceeded to use the argon plasma coagulator and fulgurated all visible remaining polyp tissue.  Pullback was then performed around the colon, encountering no additional polyps or other abnormalities apart from the above-mentioned anastomotic changes in the sigmoid region.  Retroflexion was not performed due to a small rectal ampulla, but careful antegrade viewing disclosed no distal rectal lesions.  The scope was then withdrawn from the patient and the procedure completed.  COMPLICATIONS:  None ENDOSCOPIC IMPRESSION: 1. Recurrent and residual polyp tissue and proximal descending colon, snared and fulgurated as described above. 2. Evidence of prior sigmoid colectomy RECOMMENDATIONS: Await pathology results. Consider repeat colonoscopy in one to 2 years if the patient remains in good general health, in view of the fact that she is consistently forming new polyp tissue at this site despite repeated attempts to exercise and fulgurate it to complete ablation.  REPEAT EXAM:  No  ______________________________ Bernette Redbird, MD  CC:  n. eSIGNEDMolly Maduro Lenn Volker at 12/29/2011 10:37 AM  Tonye Pearson, [phone]

## 2011-12-30 ENCOUNTER — Encounter (HOSPITAL_COMMUNITY): Payer: Self-pay | Admitting: Gastroenterology

## 2012-08-29 ENCOUNTER — Other Ambulatory Visit: Payer: Self-pay | Admitting: Gastroenterology

## 2012-08-30 ENCOUNTER — Encounter (HOSPITAL_COMMUNITY)
Admission: RE | Disposition: A | Discharge status: Home / Self Care | Payer: Self-pay | Source: Ambulatory Visit | Attending: Gastroenterology

## 2012-08-30 ENCOUNTER — Encounter (HOSPITAL_COMMUNITY): Payer: Self-pay | Admitting: *Deleted

## 2012-08-30 ENCOUNTER — Encounter (HOSPITAL_COMMUNITY): Payer: Self-pay

## 2012-08-30 ENCOUNTER — Ambulatory Visit (HOSPITAL_COMMUNITY)
Admission: RE | Admit: 2012-08-30 | Discharge: 2012-08-30 | Disposition: A | Discharge status: Home / Self Care | Payer: Medicare Other | Source: Ambulatory Visit | Attending: Gastroenterology | Admitting: Gastroenterology

## 2012-08-30 DIAGNOSIS — D126 Benign neoplasm of colon, unspecified: Secondary | ICD-10-CM | POA: Insufficient documentation

## 2012-08-30 DIAGNOSIS — Z79899 Other long term (current) drug therapy: Secondary | ICD-10-CM | POA: Insufficient documentation

## 2012-08-30 DIAGNOSIS — I10 Essential (primary) hypertension: Secondary | ICD-10-CM | POA: Insufficient documentation

## 2012-08-30 DIAGNOSIS — Z954 Presence of other heart-valve replacement: Secondary | ICD-10-CM | POA: Insufficient documentation

## 2012-08-30 HISTORY — PX: COLONOSCOPY: SHX5424

## 2012-08-30 HISTORY — PX: HOT HEMOSTASIS: SHX5433

## 2012-08-30 SURGERY — COLONOSCOPY
Anesthesia: Moderate Sedation

## 2012-08-30 SURGERY — EGD (ESOPHAGOGASTRODUODENOSCOPY)
Anesthesia: Monitor Anesthesia Care

## 2012-08-30 MED ORDER — MIDAZOLAM HCL 10 MG/2ML IJ SOLN
INTRAMUSCULAR | Status: AC
  Filled 2012-08-30: qty 2

## 2012-08-30 MED ORDER — DIPHENHYDRAMINE HCL 50 MG/ML IJ SOLN
INTRAMUSCULAR | Status: AC
  Filled 2012-08-30: qty 1

## 2012-08-30 MED ORDER — SODIUM CHLORIDE 0.9 % IV SOLN
INTRAVENOUS | Status: DC
  Administered 2012-08-30: 500 mL via INTRAVENOUS

## 2012-08-30 MED ORDER — FENTANYL CITRATE 0.05 MG/ML IJ SOLN
INTRAMUSCULAR | Status: DC | PRN
  Administered 2012-08-30 (×2): 25 ug via INTRAVENOUS

## 2012-08-30 MED ORDER — MIDAZOLAM HCL 5 MG/5ML IJ SOLN
INTRAMUSCULAR | Status: DC | PRN
  Administered 2012-08-30 (×2): 2.5 mg via INTRAVENOUS

## 2012-08-30 MED ORDER — DIPHENHYDRAMINE HCL 50 MG/ML IJ SOLN
INTRAMUSCULAR | Status: DC | PRN
  Administered 2012-08-30 (×2): 25 mg via INTRAVENOUS

## 2012-08-30 MED ORDER — FENTANYL CITRATE 0.05 MG/ML IJ SOLN
INTRAMUSCULAR | Status: AC
  Filled 2012-08-30: qty 2

## 2012-08-30 NOTE — H&P (Signed)
78 year old female presents for repeat colonoscopy, because of her recurring adenomatous polyp of the proximal colon, most recently snared and APC 8 months ago, at which time a polyp was approximately 1 cm in diameter with some surrounding tissue. The patient's medical health has been stable since I last performed this procedure.  Allergies: Codeine (nausea) and Paxil (nausea)  Outpatient medications: Zoloft, losartan, metoprolol, Xanax  Operations aortic tissue valve replacement with aortic aneurysm repair in 2010, status post sigmoid colectomy  Medical illnesses hypertension, anxiety, recurring colon polyps, status post aortic placement  Physical exam: anicteric, no pallor, chest clear. Heart without evident prosthetic valve sounds, no murmurs or arrhythmias. Abdomen without organomegaly, guarding, mass effect, tenderness. Patient is awake and alert and in no acute distress  Impression: Recurring colonic adenoma, stable for colonoscopic treatment  Plan: Proceed to colonoscopy to inspect the area of the previous polyp, and to excise any residual or recurrent tissue that may happen to be present  Florencia Reasons, M.D. [phone]

## 2012-08-30 NOTE — Op Note (Signed)
[facility] [address] [place], [address]   COLONOSCOPY PROCEDURE REPORT  PATIENT: [name] [name], [name] [name].  MR#: [id number] BIRTHDATE: May 03, 1930 , 82  yrs. old GENDER: Female ENDOSCOPIST: Bernette Redbird, MD REFERRED BY:   Dr. Merri Brunette PROCEDURE DATE:  08/30/2012 PROCEDURE:     Colonoscopy with polypectomy ASA CLASS: INDICATIONS:  recurring adenomatous polyp of the proximal colon, last snared and cauterized 8 months ago MEDICATIONS:    Benadryl 50 mg IV, fentanyl 50 mcg IV, Versed 5 mg IV  DESCRIPTION OF PROCEDURE: the patient came as an outpatient to the [facility] long endoscopy unit and informed consent was provided. The above sedation was administered without any clinical instability. The patient tolerated the procedure very well.  The Pentax adult video colonoscope was inserted and easily advanced to the cecum as identified by visualization of the appendiceal orifice and ileal cecal valve and pullback was then performed.  The quality of the prep was excellent and is felt that all areas were well seen.  The area of the previous polyp was readily identified a short distance above the cecum, by the presence of the tattoo. It was noted at this area that the patient had a roughly 3-4 cm area of recurrent polyp tissue, not very bulky, all along the edge of a fold. This was treated with a hot snare in a piecemeal fashion, with minimal oozing of blood. At the conclusion of the procedure, it appeared that the lesion had been quite effectively debulked but it was not possible to engage the lesion as a single piece with a snare, so it is doubtful that every speck of polyp tissue was removed or cauterized.  The remainder the colon was normal, without other polyps, diverticular disease, vascular ectasia, or evidence of colitis. Retroflexion in the rectum was normal.  The site corresponding to the patient's previous sigmoid colectomy was not  identified endoscopically on this occasion.     COMPLICATIONS: None  ENDOSCOPIC IMPRESSION:  moderate polyp recurrence at previous site, treated with hot snare technique as described above  RECOMMENDATIONS:await pathology results. Consider repeat colonoscopy in approximately one or 2 years, versus observation in view of the patient's advanced age, versus consideration of definitive treatment with surgical resection    _______________________________ eSigned:  Bernette Redbird, MD 08/30/2012 10:39 AM

## 2012-08-31 ENCOUNTER — Encounter (HOSPITAL_COMMUNITY): Payer: Self-pay | Admitting: Gastroenterology

## 2012-08-31 ENCOUNTER — Encounter (HOSPITAL_COMMUNITY): Payer: Self-pay

## 2012-08-31 ENCOUNTER — Ambulatory Visit (HOSPITAL_COMMUNITY): Admit: 2012-08-31 | Payer: Self-pay | Admitting: Gastroenterology

## 2012-09-30 ENCOUNTER — Encounter: Payer: Self-pay | Admitting: Cardiology

## 2012-09-30 ENCOUNTER — Ambulatory Visit (INDEPENDENT_AMBULATORY_CARE_PROVIDER_SITE_OTHER): Payer: Medicare Other | Admitting: Cardiology

## 2012-09-30 VITALS — BP 174/86 | HR 66 | Ht 63.0 in | Wt 136.8 lb

## 2012-09-30 DIAGNOSIS — Z954 Presence of other heart-valve replacement: Secondary | ICD-10-CM

## 2012-09-30 DIAGNOSIS — I712 Thoracic aortic aneurysm, without rupture, unspecified: Secondary | ICD-10-CM

## 2012-09-30 DIAGNOSIS — Z952 Presence of prosthetic heart valve: Secondary | ICD-10-CM

## 2012-09-30 DIAGNOSIS — I10 Essential (primary) hypertension: Secondary | ICD-10-CM

## 2012-09-30 HISTORY — DX: Presence of prosthetic heart valve: Z95.2

## 2012-09-30 MED ORDER — HYDROCHLOROTHIAZIDE 25 MG PO TABS: 25.0000 mg | ORAL_TABLET | Freq: Every day | ORAL | Status: DC

## 2012-09-30 NOTE — Progress Notes (Signed)
[name] [name] Date of Birth: 01-25-1930   History of Present Illness: [name] is seen today for followup. She is status post bioprosthetic aortic valve replacement and aortic root grafting in June of 2010. She has a history of hypertension. She reports that she is under a lot of stress and stays anxious and depressed. She is taking Zoloft. Her blood pressure has been high but she is frightened to take it at home. She has occasional shortness of breath.  Current Outpatient Prescriptions on File Prior to Visit  Medication Sig Dispense Refill  . ALPRAZolam (XANAX) 0.5 MG tablet Take 0.5 mg by mouth as needed.        Marland Kitchen losartan (COZAAR) 100 MG tablet Take 100 mg by mouth daily.       . metoprolol succinate (TOPROL-XL) 100 MG 24 hr tablet Take 100 mg by mouth daily. Take with or immediately following a meal.      . sertraline (ZOLOFT) 100 MG tablet Take 100 mg by mouth daily.        . hydrochlorothiazide (HYDRODIURIL) 25 MG tablet Take 1 tablet (25 mg total) by mouth daily.  90 tablet  3    Allergies  Allergen Reactions  . Codeine Nausea And Vomiting    Past Medical History  Diagnosis Date  . Hypertension   . Anemia   . Hypercholesterolemia   . Cancer     colon  . Paroxysmal atrial fibrillation   . Depression with anxiety   . Aortic aneurysm, thoracic   . Aortic insufficiency   . Thrombocytopenia     Postoperative thrombocytopenia  . Acute blood loss anemia     Postoperative  . S/P AVR (aortic valve replacement) and aortoplasty 09/30/2012    Past Surgical History  Procedure Date  . Colon surgery 1973    for colon cancer  . Aortic valve replacement 03/20/2009    mild aortic stenosis & mod to severe A1, lt verticular wall thickness was normal & lt ventricular function was estimated to be 55 to 60%,no mitral valve regugitation or stenosis  . Colonoscopy 12/29/2011    Procedure: COLONOSCOPY;  Surgeon: Florencia Reasons, MD;  Location: [facility] ENDOSCOPY;  Service: Endoscopy;   Laterality: N/A;  . Hot hemostasis 12/29/2011    Procedure: HOT HEMOSTASIS (ARGON PLASMA COAGULATION/BICAP);  Surgeon: Florencia Reasons, MD;  Location: [facility] ENDOSCOPY;  Service: Endoscopy;  Laterality: N/A;  . Rotator cuff repair     Right Shoulder  . Colonoscopy 08/30/2012    Procedure: COLONOSCOPY;  Surgeon: Florencia Reasons, MD;  Location: [facility] ENDOSCOPY;  Service: Endoscopy;  Laterality: N/A;  . Hot hemostasis 08/30/2012    Procedure: HOT HEMOSTASIS (ARGON PLASMA COAGULATION/BICAP);  Surgeon: Florencia Reasons, MD;  Location: [facility] ENDOSCOPY;  Service: Endoscopy;  Laterality: N/A;    History  Smoking status  . Never Smoker   Smokeless tobacco  . Never Used    History  Alcohol Use No    Family History  Problem Relation Age of Onset  . Parkinsonism Mother   . Hypertension Father     Review of Systems: The review of systems is positive for  chronic fatigue. She has anxiety and depression. All other systems were reviewed and are negative.  Physical Exam: BP 174/86  Pulse 66  Ht 5\' 3"  (1.6 m)  Wt 136 lb 12.8 oz (62.052 kg)  BMI 24.23 kg/m2  SpO2 95% She is an elderly white female in no acute distress. HEENT exam is unremarkable. She has  a soft left carotid bruit. There is no JVD. Lungs are clear. Cardiac exam reveals a regular rate and rhythm without gallop, murmur, or click. Abdomen is soft and nontender. She has no masses or bruits. Extremities are without edema. She does have a lot of superficial varicosities in her lower extremities. Pedal pulses are good.  LABORATORY DATA: ECG today demonstrates normal sinus rhythm with a normal ECG.   Assessment / Plan: 1. Status post aortic valve replacement and aortic root grafting in 2010. Echocardiogram in 2011 showed a good repair. Her cardiac exam is unchanged.  2. Hypertension, poorly controlled. She is on good doses of losartan and metoprolol. I recommended the addition of HCTZ 25 mg daily. I recommended she take a baby aspirin  daily. With the change in her medications I have suggested that she have a followup with her primary care in one to 2 months and have a basic metabolic panel checked. I'll see her back again in one year.

## 2012-09-30 NOTE — Patient Instructions (Signed)
We will add HCTZ 25 mg daily to your current medication  You should follow up with Dr. Renne Crigler in 1-2 months to recheck your blood pressure and have him check blood work ( kidney and electrolytes)  I will see you in one year.

## 2012-11-19 ENCOUNTER — Ambulatory Visit (INDEPENDENT_AMBULATORY_CARE_PROVIDER_SITE_OTHER): Payer: Medicare Other | Admitting: Family Medicine

## 2012-11-19 VITALS — BP 174/71 | HR 74 | Temp 98.3°F | Resp 20 | Ht 60.5 in | Wt 135.2 lb

## 2012-11-19 DIAGNOSIS — I10 Essential (primary) hypertension: Secondary | ICD-10-CM

## 2012-11-19 DIAGNOSIS — M549 Dorsalgia, unspecified: Secondary | ICD-10-CM

## 2012-11-19 DIAGNOSIS — N390 Urinary tract infection, site not specified: Secondary | ICD-10-CM

## 2012-11-19 DIAGNOSIS — IMO0001 Reserved for inherently not codable concepts without codable children: Secondary | ICD-10-CM

## 2012-11-19 DIAGNOSIS — R350 Frequency of micturition: Secondary | ICD-10-CM

## 2012-11-19 LAB — POCT URINALYSIS DIPSTICK
Bilirubin, UA: NEGATIVE
Blood, UA: NEGATIVE
Glucose, UA: NEGATIVE
Ketones, UA: NEGATIVE
Nitrite, UA: NEGATIVE
Spec Grav, UA: 1.02
Urobilinogen, UA: 1
pH, UA: 6.5

## 2012-11-19 LAB — POCT UA - MICROSCOPIC ONLY
Casts, Ur, LPF, POC: NEGATIVE
Crystals, Ur, HPF, POC: NEGATIVE
Yeast, UA: NEGATIVE

## 2012-11-19 MED ORDER — CIPROFLOXACIN HCL 250 MG PO TABS: 250.0000 mg | ORAL_TABLET | Freq: Two times a day (BID) | ORAL | Status: DC

## 2012-11-19 MED ORDER — METHOCARBAMOL 500 MG PO TABS: ORAL_TABLET | ORAL | Status: DC

## 2012-11-19 NOTE — Patient Instructions (Signed)
Use the blood pressure medicine the heart doctor gave  Take the cipro twice daily for 3 days for bladder  Take the robaxin (methocarbamol) at bedtime and morning if needed for muscle relaxant for back  Return if needed  Advise sleeping in bed rather than on couch.

## 2012-11-19 NOTE — Progress Notes (Signed)
Subjective:    Results for orders placed in visit on 11/19/12  POCT URINALYSIS DIPSTICK      Result Value Range   Color, UA yellow     Clarity, UA clear     Glucose, UA neg     Bilirubin, UA neg     Ketones, UA neg     Spec Grav, UA 1.020     Blood, UA neg     pH, UA 6.5     Protein, UA trace     Urobilinogen, UA 1.0     Nitrite, UA neg     Leukocytes, UA Trace    POCT UA - MICROSCOPIC ONLY      Result Value Range   WBC, Ur, HPF, POC 4-8     RBC, urine, microscopic 0-2     Bacteria, U Microscopic small     Mucus, UA small     Epithelial cells, urine per micros 2-4     Crystals, Ur, HPF, POC neg     Casts, Ur, LPF, POC neg     Yeast, UA neg

## 2012-11-20 LAB — URINE CULTURE: Colony Count: 10000

## 2012-11-23 ENCOUNTER — Telehealth: Payer: Self-pay

## 2012-11-23 NOTE — Telephone Encounter (Signed)
Received PA request on Robaxin. Called pt to see if she has tried any other muscle rel in past. Pt reported that this is the first time one was needed but she went ahead and paid OOP for Rx, it was only $19. No PA needed. Notified pharmacy.

## 2012-12-02 ENCOUNTER — Inpatient Hospital Stay (HOSPITAL_COMMUNITY)
Admission: EM | Admit: 2012-12-02 | Discharge: 2012-12-06 | DRG: 493 | Disposition: A | Discharge status: Skilled Nursing Facility | Payer: Medicare Other | Attending: Internal Medicine | Admitting: Internal Medicine

## 2012-12-02 ENCOUNTER — Emergency Department (HOSPITAL_COMMUNITY): Payer: Medicare Other

## 2012-12-02 ENCOUNTER — Other Ambulatory Visit: Payer: Self-pay

## 2012-12-02 ENCOUNTER — Encounter (HOSPITAL_COMMUNITY): Payer: Self-pay | Admitting: Emergency Medicine

## 2012-12-02 DIAGNOSIS — Y92009 Unspecified place in unspecified non-institutional (private) residence as the place of occurrence of the external cause: Secondary | ICD-10-CM

## 2012-12-02 DIAGNOSIS — S82202A Unspecified fracture of shaft of left tibia, initial encounter for closed fracture: Secondary | ICD-10-CM

## 2012-12-02 DIAGNOSIS — I351 Nonrheumatic aortic (valve) insufficiency: Secondary | ICD-10-CM | POA: Diagnosis present

## 2012-12-02 DIAGNOSIS — F329 Major depressive disorder, single episode, unspecified: Secondary | ICD-10-CM | POA: Diagnosis present

## 2012-12-02 DIAGNOSIS — D62 Acute posthemorrhagic anemia: Secondary | ICD-10-CM | POA: Diagnosis not present

## 2012-12-02 DIAGNOSIS — W010XXA Fall on same level from slipping, tripping and stumbling without subsequent striking against object, initial encounter: Secondary | ICD-10-CM | POA: Diagnosis present

## 2012-12-02 DIAGNOSIS — I480 Paroxysmal atrial fibrillation: Secondary | ICD-10-CM | POA: Diagnosis present

## 2012-12-02 DIAGNOSIS — I10 Essential (primary) hypertension: Secondary | ICD-10-CM | POA: Diagnosis present

## 2012-12-02 DIAGNOSIS — S82209A Unspecified fracture of shaft of unspecified tibia, initial encounter for closed fracture: Principal | ICD-10-CM

## 2012-12-02 DIAGNOSIS — Z952 Presence of prosthetic heart valve: Secondary | ICD-10-CM

## 2012-12-02 DIAGNOSIS — F411 Generalized anxiety disorder: Secondary | ICD-10-CM | POA: Diagnosis present

## 2012-12-02 DIAGNOSIS — S82409A Unspecified fracture of shaft of unspecified fibula, initial encounter for closed fracture: Principal | ICD-10-CM

## 2012-12-02 DIAGNOSIS — I739 Peripheral vascular disease, unspecified: Secondary | ICD-10-CM | POA: Diagnosis present

## 2012-12-02 DIAGNOSIS — S8292XA Unspecified fracture of left lower leg, initial encounter for closed fracture: Secondary | ICD-10-CM

## 2012-12-02 DIAGNOSIS — E7800 Pure hypercholesterolemia, unspecified: Secondary | ICD-10-CM | POA: Diagnosis present

## 2012-12-02 DIAGNOSIS — Z79899 Other long term (current) drug therapy: Secondary | ICD-10-CM

## 2012-12-02 DIAGNOSIS — Z954 Presence of other heart-valve replacement: Secondary | ICD-10-CM

## 2012-12-02 DIAGNOSIS — F3289 Other specified depressive episodes: Secondary | ICD-10-CM | POA: Diagnosis present

## 2012-12-02 DIAGNOSIS — I712 Thoracic aortic aneurysm, without rupture: Secondary | ICD-10-CM

## 2012-12-02 DIAGNOSIS — E876 Hypokalemia: Secondary | ICD-10-CM | POA: Diagnosis present

## 2012-12-02 DIAGNOSIS — I4891 Unspecified atrial fibrillation: Secondary | ICD-10-CM | POA: Diagnosis present

## 2012-12-02 LAB — URINALYSIS, MICROSCOPIC ONLY
Nitrite: NEGATIVE
Protein, ur: NEGATIVE mg/dL
Specific Gravity, Urine: 1.011 (ref 1.005–1.030)
Urobilinogen, UA: 0.2 mg/dL (ref 0.0–1.0)

## 2012-12-02 LAB — CBC
HCT: 32.6 % — ABNORMAL LOW (ref 36.0–46.0)
Hemoglobin: 10.4 g/dL — ABNORMAL LOW (ref 12.0–15.0)
MCH: 27.3 pg (ref 26.0–34.0)
MCHC: 31.9 g/dL (ref 30.0–36.0)
MCV: 85.6 fL (ref 78.0–100.0)
Platelets: 331 10*3/uL (ref 150–400)
RBC: 3.81 MIL/uL — ABNORMAL LOW (ref 3.87–5.11)
RDW: 13.4 % (ref 11.5–15.5)
WBC: 7.5 10*3/uL (ref 4.0–10.5)

## 2012-12-02 LAB — BASIC METABOLIC PANEL
BUN: 13 mg/dL (ref 6–23)
CO2: 27 mEq/L (ref 19–32)
Calcium: 8.8 mg/dL (ref 8.4–10.5)
Chloride: 98 mEq/L (ref 96–112)
Creatinine, Ser: 0.65 mg/dL (ref 0.50–1.10)
GFR calc Af Amer: >90 mL/min (ref >90)
GFR calc non Af Amer: 82 mL/min — ABNORMAL LOW (ref >90)
Glucose, Bld: 126 mg/dL — ABNORMAL HIGH (ref 70–99)
Potassium: 3.4 mEq/L — ABNORMAL LOW (ref 3.5–5.1)
Sodium: 135 mEq/L (ref 135–145)

## 2012-12-02 MED ORDER — FENTANYL CITRATE 0.05 MG/ML IJ SOLN
50.0000 ug | Freq: Once | INTRAMUSCULAR | Status: AC
  Administered 2012-12-02: 50 ug via INTRAVENOUS
  Filled 2012-12-02: qty 2

## 2012-12-02 MED ORDER — ONDANSETRON HCL 4 MG PO TABS: 4.0000 mg | ORAL_TABLET | Freq: Four times a day (QID) | ORAL | Status: DC | PRN

## 2012-12-02 MED ORDER — HYDROMORPHONE HCL PF 1 MG/ML IJ SOLN
0.5000 mg | INTRAMUSCULAR | Status: DC | PRN
  Administered 2012-12-02 – 2012-12-05 (×11): 1 mg via INTRAVENOUS
  Filled 2012-12-02 (×12): qty 1

## 2012-12-02 MED ORDER — DEXTROSE-NACL 5-0.45 % IV SOLN: INTRAVENOUS | Status: DC

## 2012-12-02 MED ORDER — POTASSIUM CHLORIDE CRYS ER 20 MEQ PO TBCR
40.0000 meq | EXTENDED_RELEASE_TABLET | Freq: Once | ORAL | Status: AC
  Administered 2012-12-02: 40 meq via ORAL
  Filled 2012-12-02: qty 2

## 2012-12-02 MED ORDER — SODIUM CHLORIDE 0.9 % IJ SOLN: 3.0000 mL | Freq: Two times a day (BID) | INTRAMUSCULAR | Status: DC

## 2012-12-02 MED ORDER — SODIUM CHLORIDE 0.9 % IV SOLN
INTRAVENOUS | Status: DC
  Administered 2012-12-02 – 2012-12-03 (×3): via INTRAVENOUS

## 2012-12-02 MED ORDER — ONDANSETRON HCL 4 MG/2ML IJ SOLN
4.0000 mg | Freq: Once | INTRAMUSCULAR | Status: AC
  Administered 2012-12-02: 4 mg via INTRAVENOUS
  Filled 2012-12-02: qty 2

## 2012-12-02 MED ORDER — ALPRAZOLAM 0.5 MG PO TABS
0.5000 mg | ORAL_TABLET | Freq: Three times a day (TID) | ORAL | Status: DC | PRN
Start: 2012-12-02 — End: 2012-12-06
  Administered 2012-12-03 – 2012-12-06 (×7): 0.5 mg via ORAL
  Filled 2012-12-02 (×7): qty 1

## 2012-12-02 MED ORDER — SERTRALINE HCL 100 MG PO TABS
100.0000 mg | ORAL_TABLET | Freq: Every day | ORAL | Status: DC
  Administered 2012-12-03 – 2012-12-06 (×4): 100 mg via ORAL
  Filled 2012-12-02 (×4): qty 1

## 2012-12-02 MED ORDER — METOPROLOL SUCCINATE ER 100 MG PO TB24
100.0000 mg | ORAL_TABLET | Freq: Every day | ORAL | Status: DC
  Administered 2012-12-03 – 2012-12-06 (×4): 100 mg via ORAL
  Filled 2012-12-02 (×4): qty 1

## 2012-12-02 MED ORDER — METHOCARBAMOL 500 MG PO TABS: 500.0000 mg | ORAL_TABLET | Freq: Three times a day (TID) | ORAL | Status: DC | PRN

## 2012-12-02 MED ORDER — ONDANSETRON HCL 4 MG/2ML IJ SOLN: 4.0000 mg | Freq: Four times a day (QID) | INTRAMUSCULAR | Status: DC | PRN

## 2012-12-02 MED ORDER — TRAMADOL HCL 50 MG PO TABS
50.0000 mg | ORAL_TABLET | Freq: Four times a day (QID) | ORAL | Status: DC | PRN
  Administered 2012-12-03: 50 mg via ORAL
  Filled 2012-12-02: qty 1

## 2012-12-02 MED ORDER — ENOXAPARIN SODIUM 40 MG/0.4ML ~~LOC~~ SOLN
40.0000 mg | SUBCUTANEOUS | Status: DC
  Administered 2012-12-02: 40 mg via SUBCUTANEOUS
  Filled 2012-12-02 (×2): qty 0.4

## 2012-12-02 MED ORDER — FENTANYL CITRATE 0.05 MG/ML IJ SOLN
50.0000 ug | Freq: Once | INTRAMUSCULAR | Status: AC
  Administered 2012-12-02: 50 ug via INTRAVENOUS

## 2012-12-02 MED ORDER — HYDROMORPHONE HCL PF 1 MG/ML IJ SOLN
0.5000 mg | INTRAMUSCULAR | Status: DC | PRN
  Administered 2012-12-02: 0.5 mg via INTRAVENOUS
  Filled 2012-12-02: qty 1

## 2012-12-02 MED ORDER — CEFAZOLIN SODIUM-DEXTROSE 2-3 GM-% IV SOLR
2.0000 g | INTRAVENOUS | Status: AC
  Administered 2012-12-03: 2 g via INTRAVENOUS

## 2012-12-02 MED ORDER — LOSARTAN POTASSIUM 50 MG PO TABS
100.0000 mg | ORAL_TABLET | Freq: Every day | ORAL | Status: DC
  Administered 2012-12-03 – 2012-12-06 (×4): 100 mg via ORAL
  Filled 2012-12-02 (×4): qty 2

## 2012-12-02 NOTE — Consult Note (Signed)
Reason for Consult:  Left tibia fracture Referring Physician:   ED physician  [name] [name] is an 79 y.o. female.  HPI:   79 year old female with LLE pain. While she was standing at the door earlier her little dog ran out of the door as her friend tripped and fell into her.  As the patient fell her left leg ended up underneath her and she had instant pain.  She was subsequently brought to the ER by EMS and complaining of pain/deformity to mid L tib/fib with distal aspect externally rotated. It appears to be closed injury. Palpable DP pulse. Foot warm. Able to move toes. XR ordered in addition to pre-op labs/CXR. Pain meds have been ordered and she states that she wants minimal medication. Ortho tech has placed the patient in a well fitted left lower leg sugar tong splint. Options are discussed with the patient and she understand that she will need to have a surgical procedure to help the fracture heal faster and help to reduce her pain. She is wanting to proceed. Risks, benefits and expectations were discussed with the patient. Patient understand the risks, benefits and expectations and wishes to proceed with surgery. We have also discussed that she will need medical clearance prior to having the procedure do to her history. If the hospitalist is able to work up and clear the patient we have discussed possibly doing surgery tomorrow around noon.   Past Medical History  Diagnosis Date  . Hypertension   . Anemia   . Hypercholesterolemia   . Cancer     colon  . Paroxysmal atrial fibrillation   . Depression with anxiety   . Aortic aneurysm, thoracic   . Aortic insufficiency   . Thrombocytopenia     Postoperative thrombocytopenia  . Acute blood loss anemia     Postoperative  . S/P AVR (aortic valve replacement) and aortoplasty 09/30/2012    Past Surgical History  Procedure Laterality Date  . Colon surgery  1973    for colon cancer  . Aortic valve replacement  03/20/2009    mild aortic  stenosis & mod to severe A1, lt verticular wall thickness was normal & lt ventricular function was estimated to be 55 to 60%,no mitral valve regugitation or stenosis  . Colonoscopy  12/29/2011    Procedure: COLONOSCOPY;  Surgeon: Florencia Reasons, MD;  Location: [facility] ENDOSCOPY;  Service: Endoscopy;  Laterality: N/A;  . Hot hemostasis  12/29/2011    Procedure: HOT HEMOSTASIS (ARGON PLASMA COAGULATION/BICAP);  Surgeon: Florencia Reasons, MD;  Location: [facility] ENDOSCOPY;  Service: Endoscopy;  Laterality: N/A;  . Rotator cuff repair      Right Shoulder  . Colonoscopy  08/30/2012    Procedure: COLONOSCOPY;  Surgeon: Florencia Reasons, MD;  Location: [facility] ENDOSCOPY;  Service: Endoscopy;  Laterality: N/A;  . Hot hemostasis  08/30/2012    Procedure: HOT HEMOSTASIS (ARGON PLASMA COAGULATION/BICAP);  Surgeon: Florencia Reasons, MD;  Location: [facility] ENDOSCOPY;  Service: Endoscopy;  Laterality: N/A;    Family History  Problem Relation Age of Onset  . Parkinsonism Mother   . Hypertension Father     Social History:  reports that she has never smoked. She has never used smokeless tobacco. She reports that she does not drink alcohol or use illicit drugs.  Allergies:  Allergies  Allergen Reactions  . Codeine Nausea And Vomiting     Results for orders placed during the hospital encounter of 12/02/12 (from the past 48 hour(s))  CBC  Status: Abnormal   Collection Time    12/02/12  2:39 PM      Result Value Range   WBC 7.5  4.0 - 10.5 K/uL   RBC 3.81 (*) 3.87 - 5.11 MIL/uL   Hemoglobin 10.4 (*) 12.0 - 15.0 g/dL   HCT 04.5 (*) 40.9 - 81.1 %   MCV 85.6  78.0 - 100.0 fL   MCH 27.3  26.0 - 34.0 pg   MCHC 31.9  30.0 - 36.0 g/dL   RDW 91.4  78.2 - 95.6 %   Platelets 331  150 - 400 K/uL  BASIC METABOLIC PANEL     Status: Abnormal   Collection Time    12/02/12  2:39 PM      Result Value Range   Sodium 135  135 - 145 mEq/L   Potassium 3.4 (*) 3.5 - 5.1 mEq/L   Chloride 98  96 - 112 mEq/L   CO2 27  19 - 32  mEq/L   Glucose, Bld 126 (*) 70 - 99 mg/dL   BUN 13  6 - 23 mg/dL   Creatinine, Ser 2.13  0.50 - 1.10 mg/dL   Calcium 8.8  8.4 - 08.6 mg/dL   GFR calc non Af Amer 82 (*) >90 mL/min   GFR calc Af Amer >90  >90 mL/min   Comment:            The eGFR has been calculated     using the CKD EPI equation.     This calculation has not been     validated in all clinical     situations.     eGFR's persistently     <90 mL/min signify     possible Chronic Kidney Disease.    Dg Tibia/fibula Left  12/02/2012  *RADIOLOGY REPORT*  Clinical Data: Severe injury, fall  LEFT TIBIA AND FIBULA - 2 VIEW  Comparison: None.  Findings: Three views of the left tibia-fibula submitted.  There is displaced fracture of the proximal shaft of the left tibia.  There is a displaced comminuted fracture proximal shaft of the fibula. Displaced oblique fracture of distal shaft of the fibula.  Diffuse osteopenia is noted.  Degenerative changes left knee.  IMPRESSION: There is displaced fracture of the proximal shaft of the left tibia.  There is a displaced comminuted fracture proximal shaft of the fibula.  Displaced oblique fracture of distal shaft of the fibula.   Original Report Authenticated By: Natasha Mead, M.D.    Dg Chest Portable 1 View  12/02/2012  *RADIOLOGY REPORT*  Clinical Data: Leg injury  PORTABLE CHEST - 1 VIEW  Comparison: 04/22/2009  Findings: Lungs remain hyperaerated.  Nodular density at the left base may simply be related to the associated rib.  No definite acute rib fracture.  No pneumothorax.  Aortic knob is distinct. Pulmonary artery is prominent.  IMPRESSION: Nodular density at the left base likely related to the rib. Upright PA and lateral radiograph is recommended when feasible. Otherwise, no evidence of acute cardiopulmonary disease. Hyperaeration is likely related to COPD.   Original Report Authenticated By: Jolaine Click, M.D.     Review of Systems  Constitutional: Negative.   HENT: Negative.   Eyes:  Negative.   Respiratory: Negative.   Cardiovascular: Negative.   Gastrointestinal: Negative.   Musculoskeletal: Positive for joint pain (left lower leg).  Skin: Negative.   Neurological: Negative.   Psychiatric/Behavioral: Negative.    Blood pressure 164/58, pulse 67, temperature 98.3 F (36.8 C), temperature source  Oral, resp. rate 18, SpO2 96.00%. Physical Exam  Constitutional: She is oriented to person, place, and time. She appears well-developed and well-nourished.  HENT:  Head: Normocephalic.  Mouth/Throat: Oropharynx is clear and moist.  Eyes: Pupils are equal, round, and reactive to light.  Neck: Neck supple. No JVD present. No tracheal deviation present. No thyromegaly present.  Cardiovascular: Normal rate and intact distal pulses.   Respiratory: Effort normal and breath sounds normal. No respiratory distress. She has no wheezes.  GI: Soft. There is no tenderness. There is no guarding.  Musculoskeletal:       Left knee: She exhibits decreased range of motion (82yf with LLE pain. Friend fell on her just prior to arrival. Pain/deformity to mid L tib/fib with distal aspect externally rotated. Appears to be closed injury. Palpable DP pulse. Foot warm. Able to move toes. XR ordered in addition to pre-op labs/CXR. Pa).       Legs: Lymphadenopathy:    She has no cervical adenopathy.  Neurological: She is alert and oriented to person, place, and time.  Skin: Skin is warm and dry.  Psychiatric: She has a normal mood and affect.    Assessment/Plan: Left proximal tibia fracture Left distal fibula fracture  She will be admitted to the hospitalist at this time for medical work up and clearance. She can have a regular diet at this time, but will be NPO after midnight for anticipation of surgery tomorrow. Plan is for a Versa Tibial Nail fixation of the left tibia.   Gerrit Halls 12/02/2012, 6:10 PM

## 2012-12-02 NOTE — ED Provider Notes (Signed)
History     CSN: [id number]  Arrival date & time 12/02/12  1424   First MD Initiated Contact with Patient 12/02/12 1449      Chief Complaint  Patient presents with  . Leg Injury    (Consider location/radiation/quality/duration/timing/severity/associated sxs/prior treatment) HPI This 79 year old female has a history of hypertension, paroxysmal atrial fibrillation, and aortic valve replacement not requiring chronic anticoagulation who was accidentally knocked over by a friend who fell on the patient just prior to arrival resulting in pain with deformity to the patient's left lower leg with obvious closed tibia/fib fracture with distal sensation intact with good movement of the toes of her left foot. There is no head or neck injury no amnesia no back pain chest pain shortness breath abdominal pain recent illnesses. Her arms and right leg are uninjured. Her skin is intact. Her last oral intake was 9:00 this morning. She does not have an orthopedic surgeon. Past Medical History  Diagnosis Date  . Hypertension   . Anemia   . Hypercholesterolemia   . Cancer     colon  . Paroxysmal atrial fibrillation   . Depression with anxiety   . Aortic aneurysm, thoracic   . Aortic insufficiency   . Thrombocytopenia     Postoperative thrombocytopenia  . Acute blood loss anemia     Postoperative  . S/P AVR (aortic valve replacement) and aortoplasty 09/30/2012    Past Surgical History  Procedure Laterality Date  . Colon surgery  1973    for colon cancer  . Aortic valve replacement  03/20/2009    mild aortic stenosis & mod to severe A1, lt verticular wall thickness was normal & lt ventricular function was estimated to be 55 to 60%,no mitral valve regugitation or stenosis  . Colonoscopy  12/29/2011    Procedure: COLONOSCOPY;  Surgeon: Florencia Reasons, MD;  Location: [facility] ENDOSCOPY;  Service: Endoscopy;  Laterality: N/A;  . Hot hemostasis  12/29/2011    Procedure: HOT HEMOSTASIS (ARGON PLASMA  COAGULATION/BICAP);  Surgeon: Florencia Reasons, MD;  Location: [facility] ENDOSCOPY;  Service: Endoscopy;  Laterality: N/A;  . Rotator cuff repair      Right Shoulder  . Colonoscopy  08/30/2012    Procedure: COLONOSCOPY;  Surgeon: Florencia Reasons, MD;  Location: [facility] ENDOSCOPY;  Service: Endoscopy;  Laterality: N/A;  . Hot hemostasis  08/30/2012    Procedure: HOT HEMOSTASIS (ARGON PLASMA COAGULATION/BICAP);  Surgeon: Florencia Reasons, MD;  Location: [facility] ENDOSCOPY;  Service: Endoscopy;  Laterality: N/A;  . Tibia im nail insertion Left 12/03/2012    Procedure: INTRAMEDULLARY (IM) NAIL TIBIAL;  Surgeon: Shelda Pal, MD;  Location: [facility] ORS;  Service: Orthopedics;  Laterality: Left;    Family History  Problem Relation Age of Onset  . Parkinsonism Mother   . Hypertension Father     History  Substance Use Topics  . Smoking status: Never Smoker   . Smokeless tobacco: Never Used  . Alcohol Use: No    OB History   Grav Para Term Preterm Abortions TAB SAB Ect Mult Living                  Review of Systems 10 Systems reviewed and are negative for acute change except as noted in the HPI. Allergies  Codeine  Home Medications   Current Outpatient Rx  Name  Route  Sig  Dispense  Refill  . ALPRAZolam (XANAX) 0.5 MG tablet   Oral   Take 0.5 mg by mouth 3 (three) times  daily as needed for anxiety.          Marland Kitchen losartan (COZAAR) 100 MG tablet   Oral   Take 100 mg by mouth daily.          . metoprolol succinate (TOPROL-XL) 100 MG 24 hr tablet   Oral   Take 100 mg by mouth daily. Take with or immediately following a meal.         . sertraline (ZOLOFT) 100 MG tablet   Oral   Take 100 mg by mouth daily.           Marland Kitchen enoxaparin (LOVENOX) 40 MG/0.4ML injection   Subcutaneous   Inject 0.4 mLs (40 mg total) into the skin daily.   14 Syringe   0   . methocarbamol (ROBAXIN) 500 MG tablet   Oral   Take 1-2 tablets (500-1,000 mg total) by mouth 3 (three) times daily as needed (for muscle  relaxant.).         Marland Kitchen traMADol (ULTRAM) 50 MG tablet   Oral   Take 1-2 tablets (50-100 mg total) by mouth every 6 (six) hours as needed for pain.   60 tablet   0     BP 143/60  Pulse 70  Temp(Src) 98 F (36.7 C) (Oral)  Resp 18  Ht 5\' 4"  (1.626 m)  Wt 136 lb 7.4 oz (61.9 kg)  BMI 23.41 kg/m2  SpO2 97%  Physical Exam  Nursing note and vitals reviewed. Constitutional:  Awake, alert, nontoxic appearance.  HENT:  Head: Atraumatic.  Eyes: Right eye exhibits no discharge. Left eye exhibits no discharge.  Neck: Neck supple.  Cardiovascular: Normal rate and regular rhythm.   Murmur heard. Pulmonary/Chest: Effort normal and breath sounds normal. No respiratory distress. She has no wheezes. She has no rales. She exhibits no tenderness.  Abdominal: Soft. Bowel sounds are normal. She exhibits no distension and no mass. There is no tenderness. There is no rebound and no guarding.  Musculoskeletal: She exhibits tenderness. She exhibits no edema.  Baseline ROM, no obvious new focal weakness. Both arms and right leg and nontender. Left leg is isolated tenderness to the lower leg approximately mid shaft tib-fib region with skin intact. She is no tenderness to her knee ankle or left foot. Left foot dorsalis pedis pulse intact with capillary refill less than 2 seconds in all toes as well as normal light touch and inability intact to dorsiflex and plantar flex the toes of her left foot, she has obvious displaced midshaft tib-fib fracture.  Neurological: She is alert.  Mental status and motor strength appears baseline for patient and situation.  Skin: No rash noted.  Psychiatric: She has a normal mood and affect.    ED Course  Procedures (including critical care time) D/w Ortho and [facility] for admit. Labs Reviewed  CBC - Abnormal; Notable for the following:    RBC 3.81 (*)    Hemoglobin 10.4 (*)    HCT 32.6 (*)    All other components within normal limits  BASIC METABOLIC PANEL -  Abnormal; Notable for the following:    Potassium 3.4 (*)    Glucose, Bld 126 (*)    GFR calc non Af Amer 82 (*)    All other components within normal limits  URINALYSIS, MICROSCOPIC ONLY - Abnormal; Notable for the following:    Hgb urine dipstick TRACE (*)    Leukocytes, UA MODERATE (*)    All other components within normal limits  BASIC METABOLIC PANEL -  Abnormal; Notable for the following:    Glucose, Bld 102 (*)    Calcium 8.2 (*)    GFR calc non Af Amer 82 (*)    All other components within normal limits  CBC - Abnormal; Notable for the following:    RBC 3.34 (*)    Hemoglobin 9.3 (*)    HCT 28.5 (*)    All other components within normal limits  CBC - Abnormal; Notable for the following:    RBC 3.01 (*)    Hemoglobin 8.2 (*)    HCT 25.6 (*)    All other components within normal limits  BASIC METABOLIC PANEL - Abnormal; Notable for the following:    Potassium 3.3 (*)    Glucose, Bld 133 (*)    GFR calc non Af Amer 80 (*)    All other components within normal limits  CBC - Abnormal; Notable for the following:    RBC 2.78 (*)    Hemoglobin 7.8 (*)    HCT 23.7 (*)    All other components within normal limits  BASIC METABOLIC PANEL - Abnormal; Notable for the following:    Calcium 8.3 (*)    GFR calc non Af Amer 82 (*)    All other components within normal limits  CBC - Abnormal; Notable for the following:    RBC 3.13 (*)    Hemoglobin 8.6 (*)    HCT 26.5 (*)    All other components within normal limits  SURGICAL PCR SCREEN  TYPE AND SCREEN  ABO/RH   No results found.   1. Fracture tibia/fibula, left, closed, initial encounter   2. Fracture tibia/fibula   3. HTN (hypertension)   4. Hypertension   5. Paroxysmal atrial fibrillation   6. Hypokalemia   7. Unspecified fracture of left lower leg, initial encounter for closed fracture   8. Essential (primary) hypertension   9. Paroxysmal atrial fibrillation   10. Aortic insufficiency   11. S/P AVR (aortic valve  replacement) and aortoplasty   12. Hypercholesterolemia   13. Aortic aneurysm, thoracic       MDM  Pt stable in ED with no significant deterioration in condition.  Patient / Family / Caregiver informed of clinical course, understand medical decision-making process, and agree with plan. The patient appears reasonably stabilized for admission considering the current resources, flow, and capabilities available in the ED at this time, and I doubt any other [facility] requiring further screening and/or treatment in the ED prior to admission.      Hurman Horn, MD 12/10/12 2229

## 2012-12-02 NOTE — H&P (Signed)
[facility] History and Physical  KATHARIN SCHNEIDER HYQ:[id number] DOB: 12/24/1929 DOA: 12/02/2012  Referring physician:  PCP: Londell Moh, MD  Specialists:   Chief Complaint: Status post fall with left leg pain  HPI: [name] [name] is a 79 y.o. female with past medical history as listed below who presents status post mechanical fall with left lower leg pain. She states that she was trying to hold down her dog when a friend came to visit and fell in the process. She was seen in the ED and x-ray of her left leg are revealed a  displaced fracture of the proximal shaft of the left tibia and a displaced comminuted  fracture proximal shaft of fibula and a displaced oblique fracture of the distal shaft of the fibula. Orthopedics was consulted and admission to the chart hospital is requested. Chest x-ray with no acute cardiopulmonary disease, and EKG with normal sinus rhythm at 63, no acute ischemic changes. She denies chest pain, shortness of breath, cough, dizziness and no focal weakness.   Review of Systems: The patient denies anorexia, fever, weight loss,, vision loss, decreased hearing, hoarseness, chest pain, syncope, dyspnea on exertion, peripheral edema, balance deficits, hemoptysis, abdominal pain, melena, hematochezia, severe indigestion/heartburn, hematuria, incontinence, muscle weakness, transient blindness, difficulty walking, depression, unusual weight change, abnormal bleeding.    Past Medical History  Diagnosis Date  . Hypertension   . Anemia   . Hypercholesterolemia   . Cancer     colon  . Paroxysmal atrial fibrillation   . Depression with anxiety   . Aortic aneurysm, thoracic   . Aortic insufficiency   . Thrombocytopenia     Postoperative thrombocytopenia  . Acute blood loss anemia     Postoperative  . S/P AVR (aortic valve replacement) and aortoplasty 09/30/2012   Past Surgical History  Procedure Laterality Date  . Colon surgery  1973    for colon  cancer  . Aortic valve replacement  03/20/2009    mild aortic stenosis & mod to severe A1, lt verticular wall thickness was normal & lt ventricular function was estimated to be 55 to 60%,no mitral valve regugitation or stenosis  . Colonoscopy  12/29/2011    Procedure: COLONOSCOPY;  Surgeon: Florencia Reasons, MD;  Location: [facility] ENDOSCOPY;  Service: Endoscopy;  Laterality: N/A;  . Hot hemostasis  12/29/2011    Procedure: HOT HEMOSTASIS (ARGON PLASMA COAGULATION/BICAP);  Surgeon: Florencia Reasons, MD;  Location: [facility] ENDOSCOPY;  Service: Endoscopy;  Laterality: N/A;  . Rotator cuff repair      Right Shoulder  . Colonoscopy  08/30/2012    Procedure: COLONOSCOPY;  Surgeon: Florencia Reasons, MD;  Location: [facility] ENDOSCOPY;  Service: Endoscopy;  Laterality: N/A;  . Hot hemostasis  08/30/2012    Procedure: HOT HEMOSTASIS (ARGON PLASMA COAGULATION/BICAP);  Surgeon: Florencia Reasons, MD;  Location: [facility] ENDOSCOPY;  Service: Endoscopy;  Laterality: N/A;   Social History:  reports that she has never smoked. She has never used smokeless tobacco. She reports that she does not drink alcohol or use illicit drugs. where does patient live--home Can patient participate in ADLs  Allergies  Allergen Reactions  . Codeine Nausea And Vomiting    Family History  Problem Relation Age of Onset  . Parkinsonism Mother   . Hypertension Father    Prior to Admission medications   Medication Sig Start Date End Date Taking? Authorizing Provider  ALPRAZolam Prudy Feeler) 0.5 MG tablet Take 0.5 mg by mouth 3 (three) times daily as needed for anxiety.  Yes Historical Provider, MD  losartan (COZAAR) 100 MG tablet Take 100 mg by mouth daily.  09/30/11  Yes Historical Provider, MD  metoprolol succinate (TOPROL-XL) 100 MG 24 hr tablet Take 100 mg by mouth daily. Take with or immediately following a meal.   Yes Londell Moh, MD  sertraline (ZOLOFT) 100 MG tablet Take 100 mg by mouth daily.     Yes Historical Provider, MD  traMADol  (ULTRAM) 50 MG tablet Take 50 mg by mouth every 6 (six) hours as needed for pain.   Yes Historical Provider, MD  methocarbamol (ROBAXIN) 500 MG tablet Take 500-1,000 mg by mouth 2 (two) times daily as needed (for muscle relaxant.).    Historical Provider, MD   Physical Exam: Filed Vitals:   12/02/12 1441 12/02/12 1805  BP: 164/58 159/47  Pulse: 67 62  Temp: 98.3 F (36.8 C) 97.5 F (36.4 C)  TempSrc: Oral Oral  Resp: 18   Height:  5\' 4"  (1.626 m)  Weight:  61.9 kg (136 lb 7.4 oz)  SpO2: 96% 99%    Constitutional: Vital signs reviewed.  Patient is a well-developed and well-nourished  in no acute distress and cooperative with exam. Alert and oriented x3.  Head: Normocephalic and atraumatic Mouth: no erythema or exudates, MMM Eyes: PERRL, EOMI, conjunctivae normal, No scleral icterus.  Neck: Supple, Trachea midline normal ROM, No JVD, mass, thyromegaly, or carotid bruit present.  Cardiovascular: RRR, S1 normal, S2 normal, pulses symmetric and intact bilaterally Pulmonary/Chest: CTAB, no wheezes, rales, or rhonchi Abdominal: Soft. Non-tender, non-distended, bowel sounds are normal, no masses, organomegaly, or guarding present.  GU: no CVA tenderness Extremities: Left lower extremity in fitted splint, right lower extremity with no cyanosis or edema  Neurological: A&O x3, Strength is normal and symmetric bilaterally, cranial nerve II-XII are grossly intact, no focal motor deficit, sensory intact to light touch bilaterally.  Skin: Warm, dry and intact. No rash,.  Psychiatric: Normal mood and affect. speech and behavior is normal. Judgment and thought content normal.  Labs on Admission:  Basic Metabolic Panel:  Recent Labs Lab 12/02/12 1439  NA 135  K 3.4*  CL 98  CO2 27  GLUCOSE 126*  BUN 13  CREATININE 0.65  CALCIUM 8.8   Liver Function Tests: No results found for this basename: AST, ALT, ALKPHOS, BILITOT, PROT, ALBUMIN,  in the last 168 hours No results found for this  basename: LIPASE, AMYLASE,  in the last 168 hours No results found for this basename: AMMONIA,  in the last 168 hours CBC:  Recent Labs Lab 12/02/12 1439  WBC 7.5  HGB 10.4*  HCT 32.6*  MCV 85.6  PLT 331   Cardiac Enzymes: No results found for this basename: CKTOTAL, CKMB, CKMBINDEX, TROPONINI,  in the last 168 hours  BNP (last 3 results) No results found for this basename: PROBNP,  in the last 8760 hours CBG: No results found for this basename: GLUCAP,  in the last 168 hours  Radiological Exams on Admission: Dg Tibia/fibula Left  12/02/2012  *RADIOLOGY REPORT*  Clinical Data: Severe injury, fall  LEFT TIBIA AND FIBULA - 2 VIEW  Comparison: None.  Findings: Three views of the left tibia-fibula submitted.  There is displaced fracture of the proximal shaft of the left tibia.  There is a displaced comminuted fracture proximal shaft of the fibula. Displaced oblique fracture of distal shaft of the fibula.  Diffuse osteopenia is noted.  Degenerative changes left knee.  IMPRESSION: There is displaced fracture of the  proximal shaft of the left tibia.  There is a displaced comminuted fracture proximal shaft of the fibula.  Displaced oblique fracture of distal shaft of the fibula.   Original Report Authenticated By: Natasha Mead, M.D.    Dg Chest Portable 1 View  12/02/2012  *RADIOLOGY REPORT*  Clinical Data: Leg injury  PORTABLE CHEST - 1 VIEW  Comparison: 04/22/2009  Findings: Lungs remain hyperaerated.  Nodular density at the left base may simply be related to the associated rib.  No definite acute rib fracture.  No pneumothorax.  Aortic knob is distinct. Pulmonary artery is prominent.  IMPRESSION: Nodular density at the left base likely related to the rib. Upright PA and lateral radiograph is recommended when feasible. Otherwise, no evidence of acute cardiopulmonary disease. Hyperaeration is likely related to COPD.   Original Report Authenticated By: Jolaine Click, M.D.        Assessment/Plan Principal Problem:   Fracture tibia/fibula -As discussed above, orthopedics consulted and surgery planned for tomorrow -Analgesics for pain management  Active Problems:   Hypertension -Continue metoprolol, monitor blood pressures follow and resume losartan as clinicallyappropriate   Paroxysmal atrial fibrillation -She is in normal sinus rhythm as above, continue metoprolol as above.   Aortic insufficiency   S/P AVR (aortic valve replacement) and aortoplasty-bioprosthetic valve -Per last echo in 2011 valves noted to open well - not on anticoagulation Hypokalemia - Replace potassium  Medical clearance Patient is status post AVR on no anticoagulation,  paroxysmal atrial fibrillation but is in normal sinus rhythm, with no acute ischemic changes on EKG - and has no cardiac symptoms  at this time. I called cardiology for consult for clearance and spoke with Dr. Kendra Opitz Dr. Peter [place]) and discussed the above and from his standpoint patient okay for surgery, and recommend continuing her beta blockers perioperatively. Her last echocardiogram per Epic was in 2011 and showed an EF of 55-60% and the aortic bioprosthetic valve noted to open well with trivial regurgitation. She will not need any further evaluation or cardiac workup prior to surgery per cardiology recommendations.She is medically cleared for the surgery she needs to have done in am, noting that her advanced age and comorbidities increase her surgical risk.   Code Status: full Family Communication: friend at bedside Disposition Plan: Admit to telemetry  Time spent: >92mins  Kela Millin [facility] Pager [phone]  If 7PM-7AM, please contact night-coverage [email or web address] Password [facility] 12/02/2012, 6:48 PM

## 2012-12-02 NOTE — ED Notes (Signed)
ZOX:[id number]<AV> Expected date:12/02/12<BR> Expected time: 2:00 PM<BR> Means of arrival:Ambulance<BR> Comments:<BR> 86yoF/fall/deformity

## 2012-12-02 NOTE — ED Notes (Signed)
Patient's neighbor tripped and fell on top of her.  Patient has deformity to left lower leg.  Pulses and sensation intact.

## 2012-12-02 NOTE — ED Provider Notes (Addendum)
MSE was initiated and I personally evaluated the patient and placed orders (if any) at  2:41 PM on December 02, 2012.  82yf with LLE pain. Friend fell on her just prior to arrival. Pain/deformity to mid L tib/fib with distal aspect externally rotated. Appears to be closed injury. Palpable DP pulse. Foot warm. Able to move toes. XR ordered in addition to pre-op labs/CXR. Pain meds. NPO. Discussed with ortho tech that needs to be splinted.  The patient appears stable so that the remainder of the MSE may be completed by another provider.   Raeford Razor, MD 12/02/12 1445  EKG:  Rhythm: normal sinus Vent. rate 63 BPM PR interval 164 ms QRS duration 78 ms QT/QTc 456/467 ms ST segments: NS ST changes. Some t wave flattening anterior precordial leads.   Raeford Razor, MD 12/02/12 1447

## 2012-12-02 NOTE — ED Notes (Signed)
Patient refusing Zofran and Fentanyl at this time

## 2012-12-03 ENCOUNTER — Inpatient Hospital Stay (HOSPITAL_COMMUNITY): Payer: Medicare Other | Admitting: Anesthesiology

## 2012-12-03 ENCOUNTER — Inpatient Hospital Stay (HOSPITAL_COMMUNITY): Payer: Medicare Other

## 2012-12-03 ENCOUNTER — Encounter (HOSPITAL_COMMUNITY)
Admission: EM | Disposition: A | Discharge status: Skilled Nursing Facility | Payer: Self-pay | Source: Home / Self Care | Attending: Internal Medicine

## 2012-12-03 ENCOUNTER — Encounter (HOSPITAL_COMMUNITY): Payer: Self-pay | Admitting: *Deleted

## 2012-12-03 ENCOUNTER — Encounter (HOSPITAL_COMMUNITY): Payer: Self-pay | Admitting: Anesthesiology

## 2012-12-03 DIAGNOSIS — S82899A Other fracture of unspecified lower leg, initial encounter for closed fracture: Secondary | ICD-10-CM

## 2012-12-03 HISTORY — PX: TIBIA IM NAIL INSERTION: SHX2516

## 2012-12-03 LAB — BASIC METABOLIC PANEL
CO2: 26 mEq/L (ref 19–32)
Calcium: 8.2 mg/dL — ABNORMAL LOW (ref 8.4–10.5)
Creatinine, Ser: 0.65 mg/dL (ref 0.50–1.10)
GFR calc non Af Amer: 82 mL/min — ABNORMAL LOW (ref >90)
Glucose, Bld: 102 mg/dL — ABNORMAL HIGH (ref 70–99)
Sodium: 136 mEq/L (ref 135–145)

## 2012-12-03 LAB — CBC
MCH: 27.8 pg (ref 26.0–34.0)
MCHC: 32.6 g/dL (ref 30.0–36.0)
MCV: 85.3 fL (ref 78.0–100.0)
Platelets: 325 10*3/uL (ref 150–400)
RDW: 13.7 % (ref 11.5–15.5)

## 2012-12-03 LAB — SURGICAL PCR SCREEN: MRSA, PCR: NEGATIVE

## 2012-12-03 LAB — TYPE AND SCREEN

## 2012-12-03 SURGERY — INSERTION, INTRAMEDULLARY ROD, TIBIA
Anesthesia: General | Site: Leg Lower | Laterality: Left | Wound class: Clean

## 2012-12-03 MED ORDER — ENOXAPARIN SODIUM 40 MG/0.4ML ~~LOC~~ SOLN
40.0000 mg | SUBCUTANEOUS | Status: DC
  Administered 2012-12-04 – 2012-12-06 (×3): 40 mg via SUBCUTANEOUS
  Filled 2012-12-03 (×3): qty 0.4

## 2012-12-03 MED ORDER — ACETAMINOPHEN 10 MG/ML IV SOLN
1000.0000 mg | Freq: Once | INTRAVENOUS | Status: DC | PRN
  Filled 2012-12-03: qty 100

## 2012-12-03 MED ORDER — OXYCODONE HCL 5 MG PO TABS
5.0000 mg | ORAL_TABLET | Freq: Once | ORAL | Status: DC | PRN
Start: 2012-12-03 — End: 2012-12-03

## 2012-12-03 MED ORDER — PROMETHAZINE HCL 25 MG/ML IJ SOLN: 6.2500 mg | INTRAMUSCULAR | Status: DC | PRN

## 2012-12-03 MED ORDER — ACETAMINOPHEN 10 MG/ML IV SOLN
INTRAVENOUS | Status: DC | PRN
  Administered 2012-12-03: 1000 mg via INTRAVENOUS

## 2012-12-03 MED ORDER — PROPOFOL 10 MG/ML IV EMUL
INTRAVENOUS | Status: DC | PRN
  Administered 2012-12-03: 25 mg via INTRAVENOUS
  Administered 2012-12-03: 110 mg via INTRAVENOUS

## 2012-12-03 MED ORDER — 0.9 % SODIUM CHLORIDE (POUR BTL) OPTIME
TOPICAL | Status: DC | PRN
  Administered 2012-12-03: 1000 mL

## 2012-12-03 MED ORDER — MIDAZOLAM HCL 5 MG/5ML IJ SOLN
INTRAMUSCULAR | Status: DC | PRN
  Administered 2012-12-03: 0.5 mg via INTRAVENOUS

## 2012-12-03 MED ORDER — DEXAMETHASONE SODIUM PHOSPHATE 10 MG/ML IJ SOLN
INTRAMUSCULAR | Status: DC | PRN
  Administered 2012-12-03: 10 mg via INTRAVENOUS

## 2012-12-03 MED ORDER — LIDOCAINE HCL (CARDIAC) 20 MG/ML IV SOLN
INTRAVENOUS | Status: DC | PRN
  Administered 2012-12-03: 50 mg via INTRAVENOUS

## 2012-12-03 MED ORDER — ONDANSETRON HCL 4 MG/2ML IJ SOLN
INTRAMUSCULAR | Status: DC | PRN
  Administered 2012-12-03 (×2): 2 mg via INTRAVENOUS

## 2012-12-03 MED ORDER — FENTANYL CITRATE 0.05 MG/ML IJ SOLN
INTRAMUSCULAR | Status: DC | PRN
  Administered 2012-12-03 (×6): 50 ug via INTRAVENOUS

## 2012-12-03 MED ORDER — MEPERIDINE HCL 50 MG/ML IJ SOLN: 6.2500 mg | INTRAMUSCULAR | Status: DC | PRN

## 2012-12-03 MED ORDER — EPHEDRINE SULFATE 50 MG/ML IJ SOLN
INTRAMUSCULAR | Status: DC | PRN
  Administered 2012-12-03: 10 mg via INTRAVENOUS

## 2012-12-03 MED ORDER — OXYCODONE HCL 5 MG/5ML PO SOLN: 5.0000 mg | Freq: Once | ORAL | Status: DC | PRN

## 2012-12-03 MED ORDER — LACTATED RINGERS IV SOLN
INTRAVENOUS | Status: DC | PRN
  Administered 2012-12-03 (×2): via INTRAVENOUS

## 2012-12-03 MED ORDER — SUCCINYLCHOLINE CHLORIDE 20 MG/ML IJ SOLN
INTRAMUSCULAR | Status: DC | PRN
  Administered 2012-12-03: 100 mg via INTRAVENOUS

## 2012-12-03 MED ORDER — HYDROMORPHONE HCL PF 1 MG/ML IJ SOLN: 0.2500 mg | INTRAMUSCULAR | Status: DC | PRN

## 2012-12-03 SURGICAL SUPPLY — 53 items
BAG SPEC THK2 15X12 ZIP CLS (MISCELLANEOUS) ×1
BAG ZIPLOCK 12X15 (MISCELLANEOUS) ×2 IMPLANT
BANDAGE ELASTIC 4 VELCRO ST LF (GAUZE/BANDAGES/DRESSINGS) ×2 IMPLANT
BANDAGE ELASTIC 6 VELCRO ST LF (GAUZE/BANDAGES/DRESSINGS) ×2 IMPLANT
BIT DRILL 3.8X6 NS (BIT) ×2 IMPLANT
BIT DRILL 4.4 NS (BIT) ×2 IMPLANT
BLADE HEX COATED 2.75 (ELECTRODE) ×2 IMPLANT
CLOTH BEACON ORANGE TIMEOUT ST (SAFETY) ×2 IMPLANT
CUFF TOURN SGL QUICK 34 (TOURNIQUET CUFF) ×1
CUFF TRNQT CYL 34X4X40X1 (TOURNIQUET CUFF) ×1 IMPLANT
DRAPE C-ARM 42X72 X-RAY (DRAPES) ×2 IMPLANT
DRAPE C-ARMOR (DRAPES) ×2 IMPLANT
DRAPE U-SHAPE 47X51 STRL (DRAPES) ×2 IMPLANT
DRSG EMULSION OIL 3X16 NADH (GAUZE/BANDAGES/DRESSINGS) ×2 IMPLANT
DRSG EMULSION OIL 3X3 NADH (GAUZE/BANDAGES/DRESSINGS) ×2 IMPLANT
DURAPREP 26ML APPLICATOR (WOUND CARE) ×4 IMPLANT
ELECT REM PT RETURN 9FT ADLT (ELECTROSURGICAL) ×2
ELECTRODE REM PT RTRN 9FT ADLT (ELECTROSURGICAL) ×1 IMPLANT
GLOVE BIOGEL PI IND STRL 7.5 (GLOVE) ×1 IMPLANT
GLOVE BIOGEL PI IND STRL 8 (GLOVE) ×1 IMPLANT
GLOVE BIOGEL PI INDICATOR 7.5 (GLOVE) ×1
GLOVE BIOGEL PI INDICATOR 8 (GLOVE) ×1
GLOVE ECLIPSE 8.0 STRL XLNG CF (GLOVE) IMPLANT
GLOVE ORTHO TXT STRL SZ7.5 (GLOVE) ×4 IMPLANT
GLOVE SURG ORTHO 8.0 STRL STRW (GLOVE) ×2 IMPLANT
GOWN BRE IMP PREV XXLGXLNG (GOWN DISPOSABLE) IMPLANT
GOWN STRL NON-REIN LRG LVL3 (GOWN DISPOSABLE) ×2 IMPLANT
GUIDEWIRE BALL NOSE 80CM (WIRE) ×2 IMPLANT
MANIFOLD NEPTUNE II (INSTRUMENTS) IMPLANT
NAIL TIBIAL 11X30CM (Nail) ×2 IMPLANT
NS IRRIG 1000ML POUR BTL (IV SOLUTION) ×2 IMPLANT
PACK LOWER EXTREMITY WL (CUSTOM PROCEDURE TRAY) ×2 IMPLANT
PAD CAST 4YDX4 CTTN HI CHSV (CAST SUPPLIES) ×4 IMPLANT
PADDING CAST COTTON 4X4 STRL (CAST SUPPLIES) ×8
PADDING CAST COTTON 6X4 STRL (CAST SUPPLIES) ×2 IMPLANT
PIN GUIDE ACE (PIN) ×2 IMPLANT
POSITIONER SURGICAL ARM (MISCELLANEOUS) ×4 IMPLANT
SCREW ACECAP 26MM (Screw) ×2 IMPLANT
SCREW ACECAP 28MM (Screw) ×2 IMPLANT
SCREW ACECAP 32MM (Screw) ×2 IMPLANT
SCREW PROXIMAL DEPUY (Screw) ×6 IMPLANT
SCREW PRXML FT 40X5.5XNS LF (Screw) ×1 IMPLANT
SCREW PRXML FT 50X5.5XLCK NS (Screw) ×1 IMPLANT
SCREW PRXML FT 55X5.5XNS TIB (Screw) ×1 IMPLANT
SCREWDRIVER HEX TIP 3.5MM (MISCELLANEOUS) ×2 IMPLANT
SPLINT FIBERGLASS 5X30 (CAST SUPPLIES) ×2 IMPLANT
SPLINT PLASTER CAST XFAST 5X30 (CAST SUPPLIES) ×2 IMPLANT
SPLINT PLASTER XFAST SET 5X30 (CAST SUPPLIES) ×2
SPONGE GAUZE 4X4 12PLY (GAUZE/BANDAGES/DRESSINGS) ×4 IMPLANT
SPONGE LAP 18X18 X RAY DECT (DISPOSABLE) ×4 IMPLANT
SUT PDS AB 3-0 CT2 27 (SUTURE) ×2 IMPLANT
TOWEL OR 17X26 10 PK STRL BLUE (TOWEL DISPOSABLE) ×6 IMPLANT
WATER STERILE IRR 1500ML POUR (IV SOLUTION) ×4 IMPLANT

## 2012-12-03 NOTE — Brief Op Note (Signed)
12/02/2012 - 12/03/2012  2:56 PM  PATIENT:  [name] [name]  79 y.o. female  PRE-OPERATIVE DIAGNOSIS:  Left tibia/fibula fracture, closed  POST-OPERATIVE DIAGNOSIS:  Left tibia/fibula fracture, closed  PROCEDURE:  Procedure(s): INTRAMEDULLARY (IM) NAIL TIBIAL (Left)  SURGEON:  Surgeon(s) and Role:    * Shelda Pal, MD - Primary    * Jacki Cones, MD - Assisting  PHYSICIAN ASSISTANT: None  ASSISTANTS: Dr. Worthy Rancher   ANESTHESIA:   general  EBL:  Total I/O In: 1300 [I.V.:1300] Out: 800 [Urine:750; Blood:50]  BLOOD ADMINISTERED:none  DRAINS: none   LOCAL MEDICATIONS USED:  NONE  SPECIMEN:  No Specimen  DISPOSITION OF SPECIMEN:  N/A  COUNTS:  YES  TOURNIQUET:  12 minutes at  DICTATION: .Other Dictation: Dictation Number [id number]  PLAN OF CARE: Admit to inpatient   PATIENT DISPOSITION:  PACU - hemodynamically stable.   Delay start of Pharmacological VTE agent (>24hrs) due to surgical blood loss or risk of bleeding: no

## 2012-12-03 NOTE — Anesthesia Postprocedure Evaluation (Signed)
Anesthesia Post Note  Patient: [name] [name]  Procedure(s) Performed: Procedure(s) (LRB): INTRAMEDULLARY (IM) NAIL TIBIAL (Left)  Anesthesia type: General  Patient location: PACU  Post pain: Pain level controlled  Post assessment: Post-op Vital signs reviewed  Last Vitals: BP 125/47  Pulse 65  Temp(Src) 37.2 C (Oral)  Resp 14  Ht 5\' 4"  (1.626 m)  Wt 136 lb 7.4 oz (61.9 kg)  BMI 23.41 kg/m2  SpO2 98%  Post vital signs: Reviewed  Level of consciousness: sedated  Complications: No apparent anesthesia complications

## 2012-12-03 NOTE — Anesthesia Preprocedure Evaluation (Addendum)
Anesthesia Evaluation  Patient identified by MRN, date of birth, ID band Patient awake    Reviewed: Allergy & Precautions, H&P , NPO status , Patient's Chart, lab work & pertinent test results, reviewed documented beta blocker date and time   Airway Mallampati: II TM Distance: >3 FB Neck ROM: Full    Dental  (+) Dental Advisory Given, Edentulous Upper, Partial Lower and Poor Dentition   Pulmonary neg pulmonary ROS,  breath sounds clear to auscultation        Cardiovascular hypertension, Pt. on medications and Pt. on home beta blockers + Peripheral Vascular Disease + dysrhythmias Atrial Fibrillation Rhythm:Regular Rate:Normal     Neuro/Psych PSYCHIATRIC DISORDERS negative neurological ROS     GI/Hepatic negative GI ROS, Neg liver ROS,   Endo/Other  negative endocrine ROS  Renal/GU negative Renal ROS     Musculoskeletal negative musculoskeletal ROS (+)   Abdominal   Peds  Hematology negative hematology ROS (+)   Anesthesia Other Findings   Reproductive/Obstetrics                          Anesthesia Physical Anesthesia Plan  ASA: III  Anesthesia Plan: General   Post-op Pain Management:    Induction: Intravenous  Airway Management Planned: Oral ETT  Additional Equipment:   Intra-op Plan:   Post-operative Plan: Extubation in OR  Informed Consent: I have reviewed the patients History and Physical, chart, labs and discussed the procedure including the risks, benefits and alternatives for the proposed anesthesia with the patient or authorized representative who has indicated his/her understanding and acceptance.   Dental advisory given  Plan Discussed with: CRNA  Anesthesia Plan Comments:        Anesthesia Quick Evaluation

## 2012-12-03 NOTE — Progress Notes (Signed)
  Left tibia/fibula fracture  No events overnight Ready for surgery today  Reviewed plan with her last night, reviewing questions and concerns  NPO To OR today for ORIF left tibia with IM nail Consent on chart

## 2012-12-03 NOTE — Progress Notes (Signed)
[facility] PROGRESS NOTE  [name] [name] ZOX:[id number] DOB: 19-Mar-1930 DOA: 12/02/2012 PCP: Londell Moh, MD  Assessment/Plan: Fracture tibia/fibula  - surgery planned today per ortho  -Analgesics for pain management  Active Problems:  Hypertension  -Continue metoprolol, monitor blood pressures follow and resume losartan as clinicallyappropriate  Paroxysmal atrial fibrillation  -She is in normal sinus rhythm as above, continue metoprolol as above.  Aortic insufficiency  S/P AVR (aortic valve replacement) and aortoplasty-bioprosthetic valve  -Per last echo in 2011 valves noted to open well  - not on anticoagulation  Hypokalemia  - Resolved   Code Status: full Family Communication: pt alone at bedside Disposition Plan: likely SNF   Consultants:  Ortho- Dr Darrelyn Hillock  Procedures:  Tib/fib surgical repair today  Antibiotics:  none  HPI/Subjective: Denies any c/o, she is ready for surgery this afternoon  Objective: Filed Vitals:   12/02/12 2201 12/03/12 0554 12/03/12 0730 12/03/12 1041  BP:  166/49 144/63 143/48  Pulse:  70 73 71  Temp: 98.1 F (36.7 C) 98.1 F (36.7 C)  98.6 F (37 C)  TempSrc: Oral Oral  Oral  Resp:  18 16 18   Height:      Weight:      SpO2:  97% 96% 94%    Intake/Output Summary (Last 24 hours) at 12/03/12 1337 Last data filed at 12/03/12 1100  Gross per 24 hour  Intake    885 ml  Output   2300 ml  Net  -1415 ml   Filed Weights   12/02/12 1805  Weight: 61.9 kg (136 lb 7.4 oz)    Exam:   General:  Elderly female in NAD  Cardiovascular: RRR, nl S1S2  Respiratory: CTAB  Abdomen: Soft +BS NT/ND  Data Reviewed: Basic Metabolic Panel:  Recent Labs Lab 12/02/12 1439 12/03/12 0455  NA 135 136  K 3.4* 4.0  CL 98 102  CO2 27 26  GLUCOSE 126* 102*  BUN 13 12  CREATININE 0.65 0.65  CALCIUM 8.8 8.2*   Liver Function Tests: No results found for this basename: AST, ALT, ALKPHOS, BILITOT, PROT, ALBUMIN,   in the last 168 hours No results found for this basename: LIPASE, AMYLASE,  in the last 168 hours No results found for this basename: AMMONIA,  in the last 168 hours CBC:  Recent Labs Lab 12/02/12 1439 12/03/12 0455  WBC 7.5 6.0  HGB 10.4* 9.3*  HCT 32.6* 28.5*  MCV 85.6 85.3  PLT 331 325   Cardiac Enzymes: No results found for this basename: CKTOTAL, CKMB, CKMBINDEX, TROPONINI,  in the last 168 hours BNP (last 3 results) No results found for this basename: PROBNP,  in the last 8760 hours CBG: No results found for this basename: GLUCAP,  in the last 168 hours  Recent Results (from the past 240 hour(s))  SURGICAL PCR SCREEN     Status: None   Collection Time    12/03/12 12:44 AM      Result Value Range Status   MRSA, PCR NEGATIVE  NEGATIVE Final   Staphylococcus aureus NEGATIVE  NEGATIVE Final   Comment:            The Xpert SA Assay (FDA     approved for NASAL specimens     in patients over 17 years of age),     is one component of     a comprehensive surveillance     program.  Test performance has     been validated by [employer]  [facility] for patients greater     than or equal to 38 year old.     It is not intended     to diagnose infection nor to     guide or monitor treatment.     Studies: Dg Tibia/fibula Left  12/02/2012  *RADIOLOGY REPORT*  Clinical Data: Severe injury, fall  LEFT TIBIA AND FIBULA - 2 VIEW  Comparison: None.  Findings: Three views of the left tibia-fibula submitted.  There is displaced fracture of the proximal shaft of the left tibia.  There is a displaced comminuted fracture proximal shaft of the fibula. Displaced oblique fracture of distal shaft of the fibula.  Diffuse osteopenia is noted.  Degenerative changes left knee.  IMPRESSION: There is displaced fracture of the proximal shaft of the left tibia.  There is a displaced comminuted fracture proximal shaft of the fibula.  Displaced oblique fracture of distal shaft of the fibula.   Original Report  Authenticated By: Natasha Mead, M.D.    Dg Chest Portable 1 View  12/02/2012  *RADIOLOGY REPORT*  Clinical Data: Leg injury  PORTABLE CHEST - 1 VIEW  Comparison: 04/22/2009  Findings: Lungs remain hyperaerated.  Nodular density at the left base may simply be related to the associated rib.  No definite acute rib fracture.  No pneumothorax.  Aortic knob is distinct. Pulmonary artery is prominent.  IMPRESSION: Nodular density at the left base likely related to the rib. Upright PA and lateral radiograph is recommended when feasible. Otherwise, no evidence of acute cardiopulmonary disease. Hyperaeration is likely related to COPD.   Original Report Authenticated By: Jolaine Click, M.D.     Scheduled Meds: . enoxaparin (LOVENOX) injection  40 mg Subcutaneous Q24H  . losartan  100 mg Oral Daily  . metoprolol succinate  100 mg Oral Daily  . sertraline  100 mg Oral Daily  . sodium chloride  3 mL Intravenous Q12H   Continuous Infusions: . sodium chloride 75 mL/hr at 12/02/12 1912    Principal Problem:   Fracture tibia/fibula Active Problems:   Hypertension   Paroxysmal atrial fibrillation   Aortic insufficiency   S/P AVR (aortic valve replacement) and aortoplasty   Hypokalemia    Time spent: 25    [facility]  [facility] Pager [phone]. If 8PM-8AM, please contact night-coverage at [email or web address], password [facility] 12/03/2012, 1:37 PM  LOS: 1 day

## 2012-12-03 NOTE — Progress Notes (Signed)
pacu nursing:  Pt has floor telemetry on upon arrival from operating room.  Will with patient to floor after recovery complete

## 2012-12-03 NOTE — Anesthesia Procedure Notes (Signed)
Procedure Name: Intubation Date/Time: 12/03/2012 1:19 PM Performed by: Edison Pace Pre-anesthesia Checklist: Patient identified, Timeout performed, Emergency Drugs available, Suction available and Patient being monitored Patient Re-evaluated:Patient Re-evaluated prior to inductionOxygen Delivery Method: Circle system utilized Preoxygenation: Pre-oxygenation with 100% oxygen Intubation Type: IV induction, Cricoid Pressure applied and Rapid sequence

## 2012-12-03 NOTE — Progress Notes (Signed)
UR completed 

## 2012-12-03 NOTE — Op Note (Signed)
NAME:  [name] [name], [name] [name]              ACCOUNT NO.:  [id number]  MEDICAL RECORD NO.:  [id number]  LOCATION:  1425                         FACILITY:  [facility]  PHYSICIAN:  Madlyn Frankel. Charlann Boxer, M.D.  DATE OF BIRTH:  [birth year]  DATE OF PROCEDURE:  12/03/2012 DATE OF DISCHARGE:                              OPERATIVE REPORT   PREOPERATIVE DIAGNOSIS:  Left closed tibia and fibula fractures.  POSTOPERATIVE DIAGNOSIS:  Left closed tibia and fibula fractures.  PROCEDURE:  Open reduction and internal fixation of the left tibia using a Biomet Versa nail 11 x 30 cm, 2 proximal interlocks and 2 distal interlocks.  SURGEON:  Madlyn Frankel. Charlann Boxer, MD  ASSISTANT:  Georges Lynch. Gioffre, MD  ANESTHESIA:  General.  SPECIMENS:  None.  COMPLICATION:  None.  BLOOD LOSS:  Probably about 100 mL or less.  TOURNIQUET TIME:  12 minutes at 250 mmHg used for initial dissection but not during reaming.  INDICATION OF THE PROCEDURE:  [name] [name] is an 79 year old female, who had presented to the emergency room after being knocked over by a friend of hers.  She was tripped by a dog.  She was noted to have closed tibia and fibula fractures, was admitted to the medical service and we were consulted for definitive management.  I had a chance to review with her the risks and benefits and necessity of fracture fixation.  She did not have evidence of any compartment syndrome and surgery was planned for the next day after admission.  After reviewing risks and benefits, she wished at this point to proceed. Consent was obtained for benefit of fracture management.  PROCEDURE IN DETAIL:  The patient was brought to the operative theater. Once adequate anesthesia, preoperative antibiotics, Ancef administered, she was positioned supine on the [place] fracture table.  Left thigh tourniquet was placed.  The left lower extremity was then cleaned and then prepped including the foot from the toes to the tourniquet on the thigh.  The  time-out was performed identifying the patient, planned procedure, and extremity.  The leg was then exsanguinated.  Tourniquet elevated to 250 mmHg.  The incision was marked out on her knee over the anterior aspect of the knee.  Midline incision was made.  Soft tissue planes created.  The medial incision was made next to the medial base of the patella tendon and the downward sloping of the anterior tibia identified.  Using the starting awl, I punched this into the anterior aspect of the tibia and confirmed the knee under fluoroscopic imaging in AP and lateral planes the orientation of the awl.  A ball-tip guidewire was then inserted through the tip of the awl and then passed across the fracture site as it was held reduced under fluoroscopic confirmation.  It was then passed to the center of the tibia distally.  I measured the depth using a depth guide, chose the 30 cm nail.  With the ball-tip guidewire in place, I subsequently reamed.  Beginning with a 9-1/2 reamer, reamed up to a 12 mm reamer getting chatter around that 10-1/2 or 11 area. I then selected a 11 mm nail.  The nail was opened and placed onto the insertion jig.  The nail was then passed by hand across the fracture site and then tapped to its appropriate depth.  With the jig in place, I placed 2 proximal screws, one in the static distal location and another one on the oblique distal.  This was done and confirmed radiographically.  The jig was then seen removed distally under perfect circle technique. Two transverse screws were placed.  Final radiographs were obtained in all planes.  The wounds were irrigated with normal saline solution.  The proximal mid line incision was reapproximated in layers with #1 Vicryl in the medial parapatellar arthrotomy incision and then 2-0 Vicryl on the remaining wound, staples on the skin.  The skin was then cleaned, dried, and dressed sterilely with Adaptic and a bulky wrap.  Her leg  was then placed back into a posterior splint with an L component and U component to stabilize the distal fibular fracture.  She was then awoken from anesthesia and brought to recovery room in stable condition, tolerating the procedure well.  We will limit her weightbearing for a while due to the fibular fractures.  Hopefully by 6-8 weeks, we will see some healing fibula and allow her to weight bear as tolerated.     Madlyn Frankel Charlann Boxer, M.D.     MDO/MEDQ  D:  12/03/2012  T:  12/03/2012  Job:  [id number]

## 2012-12-03 NOTE — Transfer of Care (Signed)
Immediate Anesthesia Transfer of Care Note  Patient: [name] [name]  Procedure(s) Performed: Procedure(s): INTRAMEDULLARY (IM) NAIL TIBIAL (Left)  Patient Location: PACU  Anesthesia Type:General  Level of Consciousness: awake, oriented, lethargic and responds to stimulation  Airway & Oxygen Therapy: Patient Spontanous Breathing and Patient connected to face mask oxygen  Post-op Assessment: Report given to PACU RN, Post -op Vital signs reviewed and stable and Patient moving all extremities  Post vital signs: Reviewed and stable  Complications: No apparent anesthesia complications

## 2012-12-03 NOTE — Preoperative (Signed)
Beta Blockers   Reason not to administer Beta Blockers:Not Applicable pt took beta blocker 

## 2012-12-04 LAB — BASIC METABOLIC PANEL
BUN: 9 mg/dL (ref 6–23)
CO2: 27 mEq/L (ref 19–32)
Calcium: 8.5 mg/dL (ref 8.4–10.5)
GFR calc non Af Amer: 80 mL/min — ABNORMAL LOW (ref >90)
Glucose, Bld: 133 mg/dL — ABNORMAL HIGH (ref 70–99)
Potassium: 3.3 mEq/L — ABNORMAL LOW (ref 3.5–5.1)
Sodium: 138 mEq/L (ref 135–145)

## 2012-12-04 LAB — CBC
HCT: 25.6 % — ABNORMAL LOW (ref 36.0–46.0)
Hemoglobin: 8.2 g/dL — ABNORMAL LOW (ref 12.0–15.0)
MCH: 27.2 pg (ref 26.0–34.0)
MCHC: 32 g/dL (ref 30.0–36.0)
RBC: 3.01 MIL/uL — ABNORMAL LOW (ref 3.87–5.11)

## 2012-12-04 MED ORDER — POTASSIUM CHLORIDE CRYS ER 20 MEQ PO TBCR
60.0000 meq | EXTENDED_RELEASE_TABLET | Freq: Once | ORAL | Status: AC
  Administered 2012-12-04: 60 meq via ORAL
  Filled 2012-12-04: qty 3

## 2012-12-04 MED FILL — Cefazolin Sodium for IV Soln 2 GM and Dextrose 3% (50 ML): INTRAVENOUS | Qty: 50 | Status: AC

## 2012-12-04 NOTE — Evaluation (Signed)
Physical Therapy Evaluation Patient Details Name: [name] [name] MRN: [id number] DOB: January 01, 1930 Today's Date: 12/04/2012 Time: 1040-1100 PT Time Calculation (min): 20 min  PT Assessment / Plan / Recommendation Clinical Impression  79 yo female s/p ORIF L tibia. Prior to admission, pt was independent. On eval, pt required Min assist for mobility. Recommend SNF for continued rehab.     PT Assessment  Patient needs continued PT services    Follow Up Recommendations  SNF    Does the patient have the potential to tolerate intense rehabilitation      Barriers to Discharge        Equipment Recommendations  None recommended by PT    Recommendations for Other Services OT consult   Frequency Min 3X/week    Precautions / Restrictions Precautions Precautions: Fall Restrictions Weight Bearing Restrictions: Yes LLE Weight Bearing: Non weight bearing   Pertinent Vitals/Pain 2/10 L LE      Mobility  Bed Mobility Bed Mobility: Supine to Sit Supine to Sit: 4: Min assist Details for Bed Mobility Assistance: assist for L LE off bed. VCs safety, technique.  Transfers Transfers: Sit to Stand;Stand to Sit Sit to Stand: From elevated surface;4: Min assist;From bed Stand to Sit: 4: Min assist;To chair/3-in-1 Details for Transfer Assistance: VCs safety, technique, hand placment. Assist to rise, stabilize, control descent  Ambulation/Gait Ambulation/Gait Assistance: 4: Min assist Ambulation Distance (Feet): 5 Feet Assistive device: Rolling walker Ambulation/Gait Assistance Details: VCS safety, technique, adherence to NWB status. Followed with recliner.  Gait Pattern: Step-to pattern    Exercises     PT Diagnosis: Difficulty walking;Abnormality of gait;Generalized weakness;Acute pain  PT Problem List: Decreased activity tolerance;Decreased mobility;Decreased balance;Decreased knowledge of use of DME;Decreased knowledge of precautions;Pain PT Treatment Interventions: DME  instruction;Gait training;Functional mobility training;Therapeutic activities;Therapeutic exercise;Balance training;Patient/family education   PT Goals Acute Rehab PT Goals PT Goal Formulation: With patient Time For Goal Achievement: 12/18/12 Potential to Achieve Goals: Good Pt will go Supine/Side to Sit: with supervision PT Goal: Supine/Side to Sit - Progress: Goal set today Pt will go Sit to Supine/Side: with supervision PT Goal: Sit to Supine/Side - Progress: Goal set today Pt will go Sit to Stand: with supervision PT Goal: Sit to Stand - Progress: Goal set today Pt will Ambulate: 1 - 15 feet;with supervision;with rolling walker PT Goal: Ambulate - Progress: Goal set today Pt will Propel Wheelchair: 51 - 150 feet;with supervision PT Goal: Propel Wheelchair - Progress: Goal set today  Visit Information  Last PT Received On: 12/04/12 Assistance Needed: +1    Subjective Data  Subjective: I know its going to hurt Patient Stated Goal: get better   Prior Functioning       Cognition  Cognition Overall Cognitive Status: Appears within functional limits for tasks assessed/performed Arousal/Alertness: Awake/alert Orientation Level: Appears intact for tasks assessed Behavior During Session: [facility] for tasks performed    Extremity/Trunk Assessment Right Lower Extremity Assessment RLE ROM/Strength/Tone: Within functional levels Left Lower Extremity Assessment LLE ROM/Strength/Tone: Deficits;Unable to fully assess LLE ROM/Strength/Tone Deficits: L lower leg splint.    Balance    End of Session PT - End of Session Equipment Utilized During Treatment: Gait belt Activity Tolerance: Patient tolerated treatment well Patient left: in chair;with call bell/phone within reach  GP     [name] [facility] 12/04/2012, 2:46 PM [phone]

## 2012-12-04 NOTE — Progress Notes (Signed)
2-23-14Doctor - Dtr Pam wants you to please call today at [phone]

## 2012-12-04 NOTE — Progress Notes (Signed)
[facility] PROGRESS NOTE  [name] [name] JWJ:[id number] DOB: 05-04-1930 DOA: 12/02/2012 PCP: Londell Moh, MD  Assessment/Plan: Fracture tibia/fibula  - per ortho, s/p surgery 2/22  -Analgesics for pain management  Active Problems:  Hypertension  -Continue metoprolol and  losartan Paroxysmal atrial fibrillation  -She is in normal sinus rhythm as above, continue metoprolol as above.  Aortic insufficiency  S/P AVR/ bioprosthetic valve (aortic valve replacement) and aortoplasty -Per last echo in 2011 valves noted to open well  - not on anticoagulation  Hypokalemia  - Replace k Post op Anemia - hgb down t 8.2 today s/p surgery, follow and recheck -no gross bleeding  Code Status: full Family Communication: pt alone at bedside Disposition Plan: likely SNF   Consultants:  Ortho- Dr Darrelyn Hillock  Procedures:  Tib/fib surgical repair 2/22  Antibiotics:  none  HPI/Subjective: Sitting up in chair, denies CP, no SOB  Objective: Filed Vitals:   12/03/12 2107 12/04/12 0205 12/04/12 0442 12/04/12 0850  BP: 133/39 137/50 144/48 125/45  Pulse: 60 68 60 77  Temp: 97.8 F (36.6 C) 98 F (36.7 C) 98.9 F (37.2 C) 98.5 F (36.9 C)  TempSrc: Oral Oral Oral Oral  Resp: 16 16 16 17   Height:      Weight:      SpO2: 98% 98% 100% 100%    Intake/Output Summary (Last 24 hours) at 12/04/12 1052 Last data filed at 12/04/12 0920  Gross per 24 hour  Intake   3000 ml  Output   1800 ml  Net   1200 ml   Filed Weights   12/02/12 1805  Weight: 61.9 kg (136 lb 7.4 oz)    Exam:   General:  Elderly female in NAD  Cardiovascular: RRR, nl S1S2  Respiratory: CTAB  Abdomen: Soft +BS NT/ND  Extremities: L leg ace wrapped, no cyanosis and no edema  Data Reviewed: Basic Metabolic Panel:  Recent Labs Lab 12/02/12 1439 12/03/12 0455  NA 135 136  K 3.4* 4.0  CL 98 102  CO2 27 26  GLUCOSE 126* 102*  BUN 13 12  CREATININE 0.65 0.65  CALCIUM 8.8 8.2*    Liver Function Tests: No results found for this basename: AST, ALT, ALKPHOS, BILITOT, PROT, ALBUMIN,  in the last 168 hours No results found for this basename: LIPASE, AMYLASE,  in the last 168 hours No results found for this basename: AMMONIA,  in the last 168 hours CBC:  Recent Labs Lab 12/02/12 1439 12/03/12 0455 12/04/12 0455  WBC 7.5 6.0 7.7  HGB 10.4* 9.3* 8.2*  HCT 32.6* 28.5* 25.6*  MCV 85.6 85.3 85.0  PLT 331 325 294   Cardiac Enzymes: No results found for this basename: CKTOTAL, CKMB, CKMBINDEX, TROPONINI,  in the last 168 hours BNP (last 3 results) No results found for this basename: PROBNP,  in the last 8760 hours CBG: No results found for this basename: GLUCAP,  in the last 168 hours  Recent Results (from the past 240 hour(s))  SURGICAL PCR SCREEN     Status: None   Collection Time    12/03/12 12:44 AM      Result Value Range Status   MRSA, PCR NEGATIVE  NEGATIVE Final   Staphylococcus aureus NEGATIVE  NEGATIVE Final   Comment:            The Xpert SA Assay (FDA     approved for NASAL specimens     in patients over 38 years of age),     is one  component of     a comprehensive surveillance     program.  Test performance has     been validated by [facility] for patients greater     than or equal to 70 year old.     It is not intended     to diagnose infection nor to     guide or monitor treatment.     Studies: Dg Tibia/fibula Left  12/03/2012  *RADIOLOGY REPORT*  Clinical Data: ORIF fracture  LEFT TIBIA AND FIBULA - 2 VIEW  Comparison: 12/02/2012  Findings: Multiple C-arm images show placement of a tibial nail with proximal and distal securing screws.  Components appear well positioned.  Alignment is near anatomic.  Proximal fibular and distal fibular fractures again demonstrated.  IMPRESSION: Tibial nail placement with near anatomic alignment.   Original Report Authenticated By: Paulina Fusi, M.D.    Dg Tibia/fibula Left  12/02/2012  *RADIOLOGY  REPORT*  Clinical Data: Severe injury, fall  LEFT TIBIA AND FIBULA - 2 VIEW  Comparison: None.  Findings: Three views of the left tibia-fibula submitted.  There is displaced fracture of the proximal shaft of the left tibia.  There is a displaced comminuted fracture proximal shaft of the fibula. Displaced oblique fracture of distal shaft of the fibula.  Diffuse osteopenia is noted.  Degenerative changes left knee.  IMPRESSION: There is displaced fracture of the proximal shaft of the left tibia.  There is a displaced comminuted fracture proximal shaft of the fibula.  Displaced oblique fracture of distal shaft of the fibula.   Original Report Authenticated By: Natasha Mead, M.D.    Dg Chest Portable 1 View  12/02/2012  *RADIOLOGY REPORT*  Clinical Data: Leg injury  PORTABLE CHEST - 1 VIEW  Comparison: 04/22/2009  Findings: Lungs remain hyperaerated.  Nodular density at the left base may simply be related to the associated rib.  No definite acute rib fracture.  No pneumothorax.  Aortic knob is distinct. Pulmonary artery is prominent.  IMPRESSION: Nodular density at the left base likely related to the rib. Upright PA and lateral radiograph is recommended when feasible. Otherwise, no evidence of acute cardiopulmonary disease. Hyperaeration is likely related to COPD.   Original Report Authenticated By: Jolaine Click, M.D.    Dg C-arm 1-60 Min-no Report  12/03/2012  CLINICAL DATA: left tib/fib fracture   C-ARM 1-60 MINUTES  Fluoroscopy was utilized by the requesting physician.  No radiographic  interpretation.      Scheduled Meds: . enoxaparin (LOVENOX) injection  40 mg Subcutaneous Q24H  . losartan  100 mg Oral Daily  . metoprolol succinate  100 mg Oral Daily  . sertraline  100 mg Oral Daily  . sodium chloride  3 mL Intravenous Q12H   Continuous Infusions: . sodium chloride 75 mL/hr at 12/03/12 1858    Principal Problem:   Fracture tibia/fibula Active Problems:   Hypertension   Paroxysmal atrial  fibrillation   Aortic insufficiency   S/P AVR (aortic valve replacement) and aortoplasty   Hypokalemia    Time spent: 25    [facility] C  [facility] Pager [phone]. If 8PM-8AM, please contact night-coverage at [email or web address], password [facility] 12/04/2012, 10:52 AM  LOS: 2 days

## 2012-12-04 NOTE — Progress Notes (Signed)
Subjective: 1 Day Post-Op Procedure(s) (LRB): INTRAMEDULLARY (IM) NAIL TIBIAL (Left) Patient reports pain as mild.  No c/o.  Objective: Vital signs in last 24 hours: Temp:  [97.8 F (36.6 C)-99 F (37.2 C)] 98.9 F (37.2 C) (02/23 0442) Pulse Rate:  [59-73] 60 (02/23 0442) Resp:  [8-19] 16 (02/23 0442) BP: (108-146)/(39-63) 144/48 mmHg (02/23 0442) SpO2:  [94 %-100 %] 100 % (02/23 0442)  Intake/Output from previous day: 02/22 0701 - 02/23 0700 In: 2880 [P.O.:480; I.V.:2400] Out: 1800 [Urine:1750; Blood:50] Intake/Output this shift:     Recent Labs  12/02/12 1439 12/03/12 0455 12/04/12 0455  HGB 10.4* 9.3* 8.2*    Recent Labs  12/03/12 0455 12/04/12 0455  WBC 6.0 7.7  RBC 3.34* 3.01*  HCT 28.5* 25.6*  PLT 325 294    Recent Labs  12/02/12 1439 12/03/12 0455  NA 135 136  K 3.4* 4.0  CL 98 102  CO2 27 26  BUN 13 12  CREATININE 0.65 0.65  GLUCOSE 126* 102*  CALCIUM 8.8 8.2*     L LE splinted.  Brisk cap refill at toes.  Feels LT.  5/5 strength in PF and DF.  Assessment/Plan: 1 Day Post-Op Procedure(s) (LRB): INTRAMEDULLARY (IM) NAIL TIBIAL (Left) Up with therapy  NWB on L LE.    Nalda Shackleford 12/04/2012, 8:47 AM

## 2012-12-05 ENCOUNTER — Encounter (HOSPITAL_COMMUNITY): Payer: Self-pay | Admitting: Orthopedic Surgery

## 2012-12-05 LAB — BASIC METABOLIC PANEL
CO2: 27 mEq/L (ref 19–32)
Calcium: 8.3 mg/dL — ABNORMAL LOW (ref 8.4–10.5)
Creatinine, Ser: 0.65 mg/dL (ref 0.50–1.10)
GFR calc non Af Amer: 82 mL/min — ABNORMAL LOW (ref >90)
Glucose, Bld: 97 mg/dL (ref 70–99)

## 2012-12-05 LAB — CBC
Hemoglobin: 7.8 g/dL — ABNORMAL LOW (ref 12.0–15.0)
MCH: 28.1 pg (ref 26.0–34.0)
MCHC: 32.9 g/dL (ref 30.0–36.0)
MCV: 85.3 fL (ref 78.0–100.0)
RBC: 2.78 MIL/uL — ABNORMAL LOW (ref 3.87–5.11)

## 2012-12-05 MED ORDER — TRAMADOL HCL 50 MG PO TABS: 50.0000 mg | ORAL_TABLET | Freq: Four times a day (QID) | ORAL | Status: DC | PRN

## 2012-12-05 MED ORDER — ENOXAPARIN SODIUM 40 MG/0.4ML ~~LOC~~ SOLN: 40.0000 mg | SUBCUTANEOUS | Status: DC

## 2012-12-05 MED FILL — Cefazolin Sodium for IV Soln 2 GM and Dextrose 3% (50 ML): INTRAVENOUS | Qty: 50 | Status: AC

## 2012-12-05 NOTE — Progress Notes (Signed)
Clinical Social Work Department BRIEF PSYCHOSOCIAL ASSESSMENT 12/05/2012  Patient:  [name] [name], [name] [name]     Account Number:  [id number]     Admit date:  12/02/2012  Clinical Social Worker:  Orpah Greek  Date/Time:  12/05/2012 12:52 PM  Referred by:  Physician  Date Referred:  12/05/2012 Referred for  SNF Placement   Other Referral:   Interview type:  Patient Other interview type:    PSYCHOSOCIAL DATA Living Status:  ALONE Admitted from facility:   Level of care:   Primary support name:  Hartley Barefoot (step-dtr) ph#: [phone] Primary support relationship to patient:  CHILD, ADULT Degree of support available:   good    CURRENT CONCERNS Current Concerns  Post-Acute Placement   Other Concerns:    SOCIAL WORK ASSESSMENT / PLAN CSW spoke with patient re: discharge planning. Note PT recommended SNF - patient is agreeable with plan, though states she would rather go home but understands need for SNF.   Assessment/plan status:  Information/Referral to [facility] Other assessment/ plan:   Information/referral to community resources:   CSW completed FL2 and faxed information out to [facility] - will follow-up with bed offers this afternoon.    PATIENT'S/FAMILY'S RESPONSE TO PLAN OF CARE: Patient states that though she would rather return home at discharge, she understands the need for SNF.        Unice Bailey, LCSW [facility] Clinical Social Worker cell #: [phone]

## 2012-12-05 NOTE — Progress Notes (Signed)
Patient ID: [name] [name], female   DOB: 1930-01-02, 79 y.o.   MRN: [id number] Subjective: 2 Days Post-Op Procedure(s) (LRB): INTRAMEDULLARY (IM) NAIL TIBIAL (Left)    Patient reports pain as mild.  Not much concern with pain as much the annoyance of decreased functional mobility.  Discussed discharge plan  Objective:   VITALS:   Filed Vitals:   12/05/12 0530  BP: 146/52  Pulse: 75  Temp: 99.6 F (37.6 C)  Resp: 16    Sensation intact distally Splint intact from surgery  LABS  Recent Labs  12/03/12 0455 12/04/12 0455 12/05/12 0426  HGB 9.3* 8.2* 7.8*  HCT 28.5* 25.6* 23.7*  WBC 6.0 7.7 5.8  PLT 325 294 269     Recent Labs  12/03/12 0455 12/04/12 1302 12/05/12 0426  NA 136 138 137  K 4.0 3.3* 4.2  BUN 12 9 15   CREATININE 0.65 0.69 0.65  GLUCOSE 102* 133* 97    No results found for this basename: LABPT, INR,  in the last 72 hours   Assessment/Plan: 2 Days Post-Op Procedure(s) (LRB): INTRAMEDULLARY (IM) NAIL TIBIAL (Left)   Advance diet Up with therapy D/C IV fluids Discharge to SNF when stable and plan confirmed  Discharge instructions: Lovenox 40mg  sq daily for 2 weeks Non weight left lower extremity until follow up Maintain post op splint until follow up Tramadol for pain

## 2012-12-05 NOTE — Clinical Documentation Improvement (Signed)
Anemia Blood Loss Clarification  THIS DOCUMENT IS NOT A PERMANENT PART OF THE MEDICAL RECORD  RESPOND TO THE THIS QUERY, FOLLOW THE INSTRUCTIONS BELOW:  1. If needed, update documentation for the patient's encounter via the notes activity.  2. Access this query again and click edit on the In [employer].  3. After updating, or not, click F2 to complete all highlighted (required) fields concerning your review. Select "additional documentation in the medical record" OR "no additional documentation provided".  4. Click Sign note button.  5. The deficiency will fall out of your In Basket *Please let us know if you are not able to complete this workflow by phone or e-mail (listed below).        12/05/12  Dear Dr.[facility]  In an effort to better capture your patient's severity of illness, reflect appropriate length of stay and utilization of resources, a review of the patient medical record has revealed the following indicators.    Based on your clinical judgment, please clarify and document in a progress note and/or discharge summary the clinical condition associated with the following supporting information:  In responding to this query please exercise your independent judgment.  The fact that a query is asked, does not imply that any particular answer is desired or expected. 12/04/12 Progr note.Marland KitchenMarland Kitchen"Post op Anemia..." For accurate Dx specificity & severity can noted "Post op Anemia" be further specified w/ clinical cond being mon'd, eval'd & tx'd. Thank you   Possible Clinical Conditions?  Expected Acute Blood Loss Anemia  Acute Blood Loss Anemia Acute on chronic blood loss anemia  Chronic blood loss anemia Precipitous drop in Hematocrit  Other Condition: (please specify) Cannot Clinically Determine  Supporting Information: Risk Factors: (recent surgery, pre op anemia, EBL in OR) 12/03/12 OP note..."BLOOD LOSS: Probably about 100 mL or less.'.Marland KitchenMarland Kitchen  Signs and  Symptoms: 12/04/12 progr note..."...-no gross bleeding.".Marland KitchenMarland Kitchen  Diagnostics: 12/02/12 1439     02/22/1 0455      12/04/12 0455  WBC   7.5                     6.0                          7.7  HGB   10.4*                   9.3*                        8.2*  HCT    32.6*                  28.5*                      25.6*   Treatments: 12/04/12 progr note.Marland KitchenMarland Kitchen"Post op Anemia- hgb down t 8.2 today s/p surgery, follow and recheck -no gross bleeding..."   Reviewed: additional documentation in the MEDICAL RECORD NUMBER2/27/14 per dc summ. ORM  Thank You,  Toribio Harbour, RN, BSN, CCDS Certified Clinical Documentation Specialist Pager: [phone]  Health Information Management [place]

## 2012-12-05 NOTE — Progress Notes (Signed)
[facility] PROGRESS NOTE  [name] [name] ZOX:[id number] DOB: 21-Sep-1930 DOA: 12/02/2012 PCP: Londell Moh, MD  Assessment/Plan: Fracture tibia/fibula  - per ortho, s/p surgery 2/22  -Analgesics for pain management  Active Problems:  Hypertension  -Continue metoprolol and  losartan Paroxysmal atrial fibrillation  -She is in normal sinus rhythm as above, continue metoprolol as above.  Aortic insufficiency  S/P AVR/ bioprosthetic valve (aortic valve replacement) and aortoplasty -Per last echo in 2011 valves noted to open well  - not on anticoagulation  Hypokalemia  - Replace k Post op Anemia/Acute blood loss - hgb down t 7.8 today s/p surgery on 2/22, follow and recheck -no gross bleeding - she is assymptomatic, follow  Code Status: full Family Communication: pt alone at bedside Disposition Plan: likely SNF   Consultants:  Ortho- Dr Darrelyn Hillock  Procedures:  Tib/fib surgical repair 2/22  Antibiotics:  none  HPI/Subjective: Pt denies any c/o, eager to work with PT today. She denies melena, no hematochezia  Objective: Filed Vitals:   12/04/12 1354 12/04/12 2205 12/05/12 0530 12/05/12 1120  BP: 116/39 104/40 146/52 153/52  Pulse: 64 89 75 72  Temp: 98.5 F (36.9 C) 98.5 F (36.9 C) 99.6 F (37.6 C) 98.5 F (36.9 C)  TempSrc: Oral Oral Oral Oral  Resp: 16 16 16    Height:      Weight:      SpO2: 97% 96% 92%     Intake/Output Summary (Last 24 hours) at 12/05/12 1509 Last data filed at 12/05/12 1127  Gross per 24 hour  Intake   1130 ml  Output   1150 ml  Net    -20 ml   Filed Weights   12/02/12 1805  Weight: 61.9 kg (136 lb 7.4 oz)    Exam:   General:  Elderly female in NAD  Cardiovascular: RRR, nl S1S2  Respiratory: CTAB  Abdomen: Soft +BS NT/ND  Extremities: L leg ace wrapped, no cyanosis and no edema  Data Reviewed: Basic Metabolic Panel:  Recent Labs Lab 12/02/12 1439 12/03/12 0455 12/04/12 1302 12/05/12 0426  NA  135 136 138 137  K 3.4* 4.0 3.3* 4.2  CL 98 102 102 105  CO2 27 26 27 27   GLUCOSE 126* 102* 133* 97  BUN 13 12 9 15   CREATININE 0.65 0.65 0.69 0.65  CALCIUM 8.8 8.2* 8.5 8.3*   Liver Function Tests: No results found for this basename: AST, ALT, ALKPHOS, BILITOT, PROT, ALBUMIN,  in the last 168 hours No results found for this basename: LIPASE, AMYLASE,  in the last 168 hours No results found for this basename: AMMONIA,  in the last 168 hours CBC:  Recent Labs Lab 12/02/12 1439 12/03/12 0455 12/04/12 0455 12/05/12 0426  WBC 7.5 6.0 7.7 5.8  HGB 10.4* 9.3* 8.2* 7.8*  HCT 32.6* 28.5* 25.6* 23.7*  MCV 85.6 85.3 85.0 85.3  PLT 331 325 294 269   Cardiac Enzymes: No results found for this basename: CKTOTAL, CKMB, CKMBINDEX, TROPONINI,  in the last 168 hours BNP (last 3 results) No results found for this basename: PROBNP,  in the last 8760 hours CBG: No results found for this basename: GLUCAP,  in the last 168 hours  Recent Results (from the past 240 hour(s))  SURGICAL PCR SCREEN     Status: None   Collection Time    12/03/12 12:44 AM      Result Value Range Status   MRSA, PCR NEGATIVE  NEGATIVE Final   Staphylococcus aureus NEGATIVE  NEGATIVE Final  Comment:            The Xpert SA Assay (FDA     approved for NASAL specimens     in patients over 63 years of age),     is one component of     a comprehensive surveillance     program.  Test performance has     been validated by [employer] for patients greater     than or equal to 72 year old.     It is not intended     to diagnose infection nor to     guide or monitor treatment.     Studies: No results found.  Scheduled Meds: . enoxaparin (LOVENOX) injection  40 mg Subcutaneous Q24H  . losartan  100 mg Oral Daily  . metoprolol succinate  100 mg Oral Daily  . sertraline  100 mg Oral Daily  . sodium chloride  3 mL Intravenous Q12H   Continuous Infusions: . sodium chloride 10 mL/hr at 12/04/12 1147     Principal Problem:   Fracture tibia/fibula Active Problems:   Hypertension   Paroxysmal atrial fibrillation   Aortic insufficiency   S/P AVR (aortic valve replacement) and aortoplasty   Hypokalemia    Time spent: 25    [facility]  [facility] Pager [phone]. If 8PM-8AM, please contact night-coverage at [email or web address], password [facility] 12/05/2012, 3:09 PM  LOS: 3 days

## 2012-12-05 NOTE — Progress Notes (Signed)
CSW will follow-up with bed offers this afternoon. Information faxed to [facility] for insurance authorization.   Clinical Social Work Department CLINICAL SOCIAL WORK PLACEMENT NOTE 12/05/2012  Patient:  [name] [name], [name] [name]  Account Number:  [id number] Admit date:  12/02/2012  Clinical Social Worker:  Orpah Greek  Date/time:  12/05/2012 01:00 PM  Clinical Social Work is seeking post-discharge placement for this patient at the following level of care:   SKILLED NURSING   (*CSW will update this form in Epic as items are completed)   12/05/2012  Patient/family provided with [facility] Department of Clinical Social Work's list of facilities offering this level of care within the geographic area requested by the patient (or if unable, by the patient's family).  12/05/2012  Patient/family informed of their freedom to choose among providers that offer the needed level of care, that participate in Medicare, Medicaid or managed care program needed by the patient, have an available bed and are willing to accept the patient.  12/05/2012  Patient/family informed of [facility]' ownership interest in [facility], as well as of the fact that they are under no obligation to receive care at this facility.  PASARR submitted to EDS on 12/05/2012 PASARR number received from EDS on 12/05/2012  FL2 transmitted to all facilities in geographic area requested by pt/family on  12/05/2012 FL2 transmitted to all facilities within larger geographic area on   Patient informed that his/her managed care company has contracts with or will negotiate with  certain facilities, including the following:     Patient/family informed of bed offers received:   Patient chooses bed at  Physician recommends and patient chooses bed at    Patient to be transferred to  on   Patient to be transferred to facility by   The following physician request were entered in Epic:   Additional Comments:  Unice Bailey, LCSW [facility] Clinical Social Worker cell #: [phone]

## 2012-12-05 NOTE — Progress Notes (Signed)
Physical Therapy Treatment Patient Details Name: [name] [name] MRN: [id number] DOB: 1930-05-23 Today's Date: 12/05/2012 Time: 1415-1430 PT Time Calculation (min): 15 min  PT Assessment / Plan / Recommendation Comments on Treatment Session  2nd session at pt's request. Progressing well. Tolerated increased ambulation distance. Recommend SNF    Follow Up Recommendations  SNF     Does the patient have the potential to tolerate intense rehabilitation     Barriers to Discharge        Equipment Recommendations  None recommended by PT    Recommendations for Other Services OT consult  Frequency Min 3X/week   Plan Discharge plan remains appropriate    Precautions / Restrictions Precautions Precautions: Fall Restrictions Weight Bearing Restrictions: Yes LLE Weight Bearing: Non weight bearing   Pertinent Vitals/Pain Pt denies pain    Mobility  Transfers Transfers: Sit to Stand;Stand to Sit Sit to Stand: 4: Min guard;From chair/3-in-1 Stand to Sit: 4: Min guard;To chair/3-in-1 Details for Transfer Assistance: VCs safety, technique, hand placment. Assist to rise, stabilize, control descent, adherence to NWB status during standing Ambulation/Gait Ambulation/Gait Assistance: 4: Min assist Ambulation Distance (Feet): 20 Feet (x2) Assistive device: Rolling walker Ambulation/Gait Assistance Details: VCs safety, technique, adherence to NWB status, pacing. Assist  needed to stabilize throughout ambulation. Seated rest break between walks.  Gait Pattern: Step-to pattern General Gait Details: NWB LLE    Exercises    PT Diagnosis:    PT Problem List:   PT Treatment Interventions:     PT Goals Acute Rehab PT Goals PT Goal Formulation: With patient Time For Goal Achievement: 12/18/12 Potential to Achieve Goals: Good Pt will go Supine/Side to Sit: with supervision PT Goal: Supine/Side to Sit - Progress: Progressing toward goal Pt will go Sit to Supine/Side: with supervision Pt  will go Sit to Stand: with supervision PT Goal: Sit to Stand - Progress: Progressing toward goal Pt will Ambulate: 1 - 15 feet;with supervision;with rolling walker PT Goal: Ambulate - Progress: Progressing toward goal Pt will Propel Wheelchair: 51 - 150 feet;with supervision  Visit Information  Last PT Received On: 12/05/12 Assistance Needed: +1    Subjective Data  Subjective: I havent needed any pain meds today Patient Stated Goal: get better, to walk   Cognition  Cognition Overall Cognitive Status: Appears within functional limits for tasks assessed/performed Arousal/Alertness: Awake/alert Orientation Level: Appears intact for tasks assessed Behavior During Session: [facility] for tasks performed    Balance     End of Session PT - End of Session Equipment Utilized During Treatment: Gait belt Activity Tolerance: Patient tolerated treatment well Patient left: in chair;with call bell/phone within reach Nurse Communication: Mobility status   GP     [name] [facility] 12/05/2012, 2:46 PM [phone]

## 2012-12-05 NOTE — Progress Notes (Signed)
Physical Therapy Treatment Patient Details Name: [name] [name] MRN: [id number] DOB: Feb 21, 1930 Today's Date: 12/05/2012 Time: 2952-8413 PT Time Calculation (min): 18 min  PT Assessment / Plan / Recommendation Comments on Treatment Session  Pt progressing well with mobility, she is eager to do more walking/exercises. Today she walked 10' and performed LLE strengthening exercises.     Follow Up Recommendations  SNF     Does the patient have the potential to tolerate intense rehabilitation     Barriers to Discharge        Equipment Recommendations  None recommended by PT    Recommendations for Other Services OT consult  Frequency Min 3X/week   Plan Discharge plan remains appropriate;Frequency remains appropriate    Precautions / Restrictions Precautions Precautions: Fall Restrictions Weight Bearing Restrictions: Yes LLE Weight Bearing: Non weight bearing   Pertinent Vitals/Pain **5/10 LLE with activity Ice applied*    Mobility  Bed Mobility Bed Mobility: Supine to Sit Supine to Sit: 4: Min assist Details for Bed Mobility Assistance: assist for L LE off bed. VCs safety, technique.  Transfers Transfers: Sit to Stand;Stand to Sit Sit to Stand: From elevated surface;From bed;4: Min guard;With upper extremity assist Stand to Sit: To chair/3-in-1;4: Min guard;With upper extremity assist;With armrests Details for Transfer Assistance: VCs safety, technique, hand placment. Assist to rise, stabilize, control descent  Ambulation/Gait Ambulation/Gait Assistance: 4: Min assist Ambulation Distance (Feet): 10 Feet Assistive device: Rolling walker Gait Pattern: Step-to pattern General Gait Details: NWB LLE    Exercises General Exercises - Lower Extremity Quad Sets: AROM;Left;5 reps Short Arc QuadBarbaraann Boys;Left;10 reps Heel Slides: AAROM;Left;10 reps Hip ABduction/ADduction: AAROM;Left;10 reps Straight Leg Raises: AAROM;Left;10 reps   PT Diagnosis:    PT Problem List:   PT  Treatment Interventions:     PT Goals Acute Rehab PT Goals PT Goal Formulation: With patient Time For Goal Achievement: 12/18/12 Potential to Achieve Goals: Good Pt will go Supine/Side to Sit: with supervision PT Goal: Supine/Side to Sit - Progress: Progressing toward goal Pt will go Sit to Supine/Side: with supervision Pt will go Sit to Stand: with supervision PT Goal: Sit to Stand - Progress: Progressing toward goal Pt will Ambulate: 1 - 15 feet;with supervision;with rolling walker PT Goal: Ambulate - Progress: Progressing toward goal Pt will Propel Wheelchair: 51 - 150 feet;with supervision  Visit Information  Last PT Received On: 12/05/12 Assistance Needed: +1    Subjective Data  Subjective: It doesn't hurt that bad. What do I need to be able to do to go home? Patient Stated Goal: get better, to walk   Cognition  Cognition Overall Cognitive Status: Appears within functional limits for tasks assessed/performed Arousal/Alertness: Awake/alert Orientation Level: Appears intact for tasks assessed Behavior During Session: [facility] for tasks performed    Balance     End of Session PT - End of Session Equipment Utilized During Treatment: Gait belt Activity Tolerance: Patient tolerated treatment well Patient left: in chair;with call bell/phone within reach Nurse Communication: Mobility status   GP     [name] 12/05/2012, 12:07 PM [phone]

## 2012-12-05 NOTE — Progress Notes (Signed)
Patient evaluated for long-term disease management services with [facility] Care [facility] Program as a benefit of her [employer]. Appears discharge plan is for SNF. Not appropriate for [facility] Care Management services for home at this time.   Arvella Merles, RN,BSN, [facility], [phone]

## 2012-12-06 LAB — CBC
HCT: 26.5 % — ABNORMAL LOW (ref 36.0–46.0)
MCH: 27.5 pg (ref 26.0–34.0)
MCV: 84.7 fL (ref 78.0–100.0)
Platelets: 298 10*3/uL (ref 150–400)
RBC: 3.13 MIL/uL — ABNORMAL LOW (ref 3.87–5.11)

## 2012-12-06 MED ORDER — METHOCARBAMOL 500 MG PO TABS: 500.0000 mg | ORAL_TABLET | Freq: Three times a day (TID) | ORAL | Status: DC | PRN

## 2012-12-06 MED ORDER — BISACODYL 5 MG PO TBEC
10.0000 mg | DELAYED_RELEASE_TABLET | Freq: Every day | ORAL | Status: DC | PRN
  Administered 2012-12-06: 10 mg via ORAL
  Filled 2012-12-06: qty 2

## 2012-12-06 NOTE — Progress Notes (Signed)
Patient is set to discharge to [facility] SNF today. Patient & daughter aware. Discharge packet in [facility] with AVS, FL2, chart copy & prescription. [facility] called for 4:30 transport.   Clinical Social Work Department CLINICAL SOCIAL WORK PLACEMENT NOTE 12/06/2012  Patient:  [name] [name], [name] [name]  Account Number:  [id number] Admit date:  12/02/2012  Clinical Social Worker:  Orpah Greek  Date/time:  12/05/2012 01:00 PM  Clinical Social Work is seeking post-discharge placement for this patient at the following level of care:   SKILLED NURSING   (*CSW will update this form in Epic as items are completed)   12/05/2012  Patient/family provided with [facility] Department of Clinical Social Work's list of facilities offering this level of care within the geographic area requested by the patient (or if unable, by the patient's family).  12/05/2012  Patient/family informed of their freedom to choose among providers that offer the needed level of care, that participate in Medicare, Medicaid or managed care program needed by the patient, have an available bed and are willing to accept the patient.  12/05/2012  Patient/family informed of [facility]' ownership interest in [facility], as well as of the fact that they are under no obligation to receive care at this facility.  PASARR submitted to EDS on 12/05/2012 PASARR number received from EDS on 12/05/2012  FL2 transmitted to all facilities in geographic area requested by pt/family on  12/05/2012 FL2 transmitted to all facilities within larger geographic area on   Patient informed that his/her managed care company has contracts with or will negotiate with  certain facilities, including the following:     Patient/family informed of bed offers received:  12/06/2012 Patient chooses bed at [facility] Physician recommends and patient chooses bed at    Patient to be transferred to [facility] on  12/06/2012 Patient to be transferred to facility by [facility]  The following physician request were entered in Epic:   Additional Comments:  Unice Bailey, LCSW [facility] Clinical Social Worker cell #: [phone]

## 2012-12-06 NOTE — Progress Notes (Signed)
Patient has a bed at [facility] SNF. Patient & daughter, Elita Quick aware & agreeable. Anticipating discharge today. [employer] authorization obtained. Patient will need [facility] for transportation.   Unice Bailey, LCSW [facility] Clinical Social Worker cell #: [phone]

## 2012-12-06 NOTE — Progress Notes (Signed)
Report called to Lamona Curl at [facility].

## 2012-12-06 NOTE — Progress Notes (Signed)
   CARE MANAGEMENT NOTE 12/06/2012  Patient:  [name] [name], [name] [name]   Account Number:  [id number]  Date Initiated:  12/06/2012  Documentation initiated by:  Jiles Crocker  Subjective/Objective Assessment:   ADMITTED WITH HIP FRACTURE     Action/Plan:   LIVES ALONE  PCP: Londell Moh, MD  FOR POSSIBLE SNF PLACEMENT; SOC WORKER REFERRAL PLACED   Anticipated DC Date:  12/09/2012   Anticipated DC Plan:  SKILLED NURSING FACILITY  In-house referral  Clinical Social Worker      DC Planning Services  CM consult             Status of service:  In process, will continue to follow Medicare Important Message given?  NA - LOS <3 / Initial given by admissions (If response is "NO", the following Medicare IM given date fields will be blank) Per UR Regulation:  Reviewed for med. necessity/level of care/duration of stay Comments:  12/06/2012- B Pharaoh Pio RN,BSN,MHA

## 2012-12-06 NOTE — Discharge Summary (Signed)
Physician Discharge Summary  [name] [name] GNF:[id number] DOB: [birth year] DOA: 12/02/2012  PCP: Londell Moh, MD  Admit date: 12/02/2012 Discharge date: 12/06/2012  Time spent: >30 minutes  Recommendations for Outpatient Follow-up:  1. Nursing home M.D. in one to 2 days 2. Dr. Charlann Boxer in 2 weeks, call for appointment  Discharge Diagnoses:  Principal Problem:   Fracture tibia/fibula Active Problems:   Hypertension   Paroxysmal atrial fibrillation   Aortic insufficiency   S/P AVR (aortic valve replacement) and aortoplasty   Hypokalemia   Discharge Condition: Improved/stable  Diet recommendation: Heart healthy  Filed Weights   12/02/12 1805  Weight: 61.9 kg (136 lb 7.4 oz)    History of present illness:  [name] [name] is a 79 y.o. female with past medical history as listed below who presents status post mechanical fall with left lower leg pain. She states that she was trying to hold down her dog when a friend came to visit and fell in the process. She was seen in the ED and x-ray of her left leg are revealed a displaced fracture of the proximal shaft of the left tibia and a displaced comminuted fracture proximal shaft of fibula and a displaced oblique fracture of the distal shaft of the fibula. Orthopedics was consulted and admission to the chart hospital is requested. Chest x-ray with no acute cardiopulmonary disease, and EKG with normal sinus rhythm at 63, no acute ischemic changes. She denies chest pain, shortness of breath, cough, dizziness and no focal weakness.   Hospital Course:  Fracture tibia/fibula  -Discussed above, upon admission orthopedics was consulted and Dr. Charlann Boxer followed up with patient on 2/22 she had surgical repair of the tib-fib fracture. -She was placed on Analgesics for pain management  -Orthopedics followed up with patient and recommended Lovenox subcutaneous for 2 weeks -for post surgical DVT prophylaxis  Also per orthopedics: - Non weight  left lower extremity until follow up  -Maintain post op splint until follow up -she isTo followup with Dr. Charlann Boxer in 2 weeks. -PT was consulted and saw patient in the hospital and she was placed in a nursing home for rehabilitation. Active Problems:  Hypertension  -Continue metoprolol and losartan  Paroxysmal atrial fibrillation  -She was maintained on metoprolol during her hospital stay and remained in normal sinus rhythm. Aortic insufficiency  S/P AVR/ bioprosthetic valve (aortic valve replacement) and aortoplasty  -Per last echo in 2011 valves noted to open well  - not on anticoagulation  Hypokalemia  - Replace k  Post op Anemia/Acute blood loss  -Her hemoglobin drifted down to 7.8 in the hospital postsurgically and she had also been on IV fluids. -no gross bleeding  - she is assymptomatic, in followup today her hemoglobin is 8.6. Followup outpatient with nursing home M.D.   Procedures:   Open reduction and internal fixation of the left tibia using  a Biomet Versa nail 11 x 30 cm, 2 proximal interlocks and 2 distal  Interlocks.-Per Dr. Charlann Boxer  Consultations:  Orthopedic/Dr. Charlann Boxer  Discharge Exam: Filed Vitals:   12/05/12 1120 12/05/12 1400 12/05/12 2123 12/06/12 0532  BP: 153/52 138/47 110/36 147/52  Pulse: 72 73 81 78  Temp: 98.5 F (36.9 C) 98.7 F (37.1 C) 98.2 F (36.8 C) 98.4 F (36.9 C)  TempSrc: Oral Oral Oral Oral  Resp:  18 20 18   Height:      Weight:      SpO2:  96% 95% 94%    Exam:  General: Elderly female in NAD  Cardiovascular: RRR, nl S1S2  Respiratory: CTAB  Abdomen: Soft +BS NT/ND  Extremities: L leg ace wrapped, no cyanosis and no edema    Discharge Instructions  Discharge Orders   Future Orders Complete By Expires     Diet - low sodium heart healthy  As directed     Discharge instructions  As directed     Comments:      LOVENOX 40MG  SQ for 2weeks Non weight left lower extremity until follow up with orthopedics Maintain post op splint  until follow up    Increase activity slowly  As directed         Medication List    STOP taking these medications       ciprofloxacin 250 MG tablet  Commonly known as:  CIPRO      TAKE these medications       ALPRAZolam 0.5 MG tablet  Commonly known as:  XANAX  Take 0.5 mg by mouth 3 (three) times daily as needed for anxiety.     enoxaparin 40 MG/0.4ML injection  Commonly known as:  LOVENOX  Inject 0.4 mLs (40 mg total) into the skin daily.     losartan 100 MG tablet  Commonly known as:  COZAAR  Take 100 mg by mouth daily.     methocarbamol 500 MG tablet  Commonly known as:  ROBAXIN  Take 1-2 tablets (500-1,000 mg total) by mouth 3 (three) times daily as needed (for muscle relaxant.).     metoprolol succinate 100 MG 24 hr tablet  Commonly known as:  TOPROL-XL  Take 100 mg by mouth daily. Take with or immediately following a meal.     sertraline 100 MG tablet  Commonly known as:  ZOLOFT  Take 100 mg by mouth daily.     traMADol 50 MG tablet  Commonly known as:  ULTRAM  Take 1-2 tablets (50-100 mg total) by mouth every 6 (six) hours as needed for pain.           Follow-up Information   Follow up with Shelda Pal, MD. Call in 2 weeks.   Contact information:   [address] [place] [address] [phone]        The results of significant diagnostics from this hospitalization (including imaging, microbiology, ancillary and laboratory) are listed below for reference.    Significant Diagnostic Studies: Dg Tibia/fibula Left  12/03/2012  *RADIOLOGY REPORT*  Clinical Data: ORIF fracture  LEFT TIBIA AND FIBULA - 2 VIEW  Comparison: 12/02/2012  Findings: Multiple C-arm images show placement of a tibial nail with proximal and distal securing screws.  Components appear well positioned.  Alignment is near anatomic.  Proximal fibular and distal fibular fractures again demonstrated.  IMPRESSION: Tibial nail placement with  near anatomic alignment.   Original Report Authenticated By: Paulina Fusi, M.D.    Dg Tibia/fibula Left  12/02/2012  *RADIOLOGY REPORT*  Clinical Data: Severe injury, fall  LEFT TIBIA AND FIBULA - 2 VIEW  Comparison: None.  Findings: Three views of the left tibia-fibula submitted.  There is displaced fracture of the proximal shaft of the left tibia.  There is a displaced comminuted fracture proximal shaft of the fibula. Displaced oblique fracture of distal shaft of the fibula.  Diffuse osteopenia is noted.  Degenerative changes left knee.  IMPRESSION: There is displaced fracture of the proximal shaft of the left tibia.  There is a displaced comminuted fracture proximal shaft of the fibula.  Displaced oblique fracture  of distal shaft of the fibula.   Original Report Authenticated By: Natasha Mead, M.D.    Dg Chest Portable 1 View  12/02/2012  *RADIOLOGY REPORT*  Clinical Data: Leg injury  PORTABLE CHEST - 1 VIEW  Comparison: 04/22/2009  Findings: Lungs remain hyperaerated.  Nodular density at the left base may simply be related to the associated rib.  No definite acute rib fracture.  No pneumothorax.  Aortic knob is distinct. Pulmonary artery is prominent.  IMPRESSION: Nodular density at the left base likely related to the rib. Upright PA and lateral radiograph is recommended when feasible. Otherwise, no evidence of acute cardiopulmonary disease. Hyperaeration is likely related to COPD.   Original Report Authenticated By: Jolaine Click, M.D.    Dg C-arm 1-60 Min-no Report  12/03/2012  CLINICAL DATA: left tib/fib fracture   C-ARM 1-60 MINUTES  Fluoroscopy was utilized by the requesting physician.  No radiographic  interpretation.      Microbiology: Recent Results (from the past 240 hour(s))  SURGICAL PCR SCREEN     Status: None   Collection Time    12/03/12 12:44 AM      Result Value Range Status   MRSA, PCR NEGATIVE  NEGATIVE Final   Staphylococcus aureus NEGATIVE  NEGATIVE Final   Comment:             The Xpert SA Assay (FDA     approved for NASAL specimens     in patients over 56 years of age),     is one component of     a comprehensive surveillance     program.  Test performance has     been validated by [employer] for patients greater     than or equal to 52 year old.     It is not intended     to diagnose infection nor to     guide or monitor treatment.     Labs: Basic Metabolic Panel:  Recent Labs Lab 12/02/12 1439 12/03/12 0455 12/04/12 1302 12/05/12 0426  NA 135 136 138 137  K 3.4* 4.0 3.3* 4.2  CL 98 102 102 105  CO2 27 26 27 27   GLUCOSE 126* 102* 133* 97  BUN 13 12 9 15   CREATININE 0.65 0.65 0.69 0.65  CALCIUM 8.8 8.2* 8.5 8.3*   Liver Function Tests: No results found for this basename: AST, ALT, ALKPHOS, BILITOT, PROT, ALBUMIN,  in the last 168 hours No results found for this basename: LIPASE, AMYLASE,  in the last 168 hours No results found for this basename: AMMONIA,  in the last 168 hours CBC:  Recent Labs Lab 12/02/12 1439 12/03/12 0455 12/04/12 0455 12/05/12 0426 12/06/12 1010  WBC 7.5 6.0 7.7 5.8 5.9  HGB 10.4* 9.3* 8.2* 7.8* 8.6*  HCT 32.6* 28.5* 25.6* 23.7* 26.5*  MCV 85.6 85.3 85.0 85.3 84.7  PLT 331 325 294 269 298   Cardiac Enzymes: No results found for this basename: CKTOTAL, CKMB, CKMBINDEX, TROPONINI,  in the last 168 hours BNP: BNP (last 3 results) No results found for this basename: PROBNP,  in the last 8760 hours CBG: No results found for this basename: GLUCAP,  in the last 168 hours     Signed:  Seve Monette C  [facility] 12/06/2012, 2:07 PM

## 2012-12-06 NOTE — Progress Notes (Signed)
Physical Therapy Treatment Patient Details Name: [name] [name] MRN: [id number] DOB: 08/07/1930 Today's Date: 12/06/2012 Time: 3244-0102 PT Time Calculation (min): 26 min  PT Assessment / Plan / Recommendation Comments on Treatment Session  Pt ambulated x2 with RW and performed exercises.  Pt also started on education of w/c mobility per pt request.    Follow Up Recommendations  SNF     Does the patient have the potential to tolerate intense rehabilitation     Barriers to Discharge        Equipment Recommendations  None recommended by PT    Recommendations for Other Services    Frequency     Plan Discharge plan remains appropriate;Frequency remains appropriate    Precautions / Restrictions Precautions Precautions: Fall Restrictions Weight Bearing Restrictions: Yes LLE Weight Bearing: Non weight bearing   Pertinent Vitals/Pain No pain    Mobility  Bed Mobility Bed Mobility: Not assessed Transfers Transfers: Sit to Stand;Stand to Sit;Stand Pivot Transfers Sit to Stand: 4: Min guard;With upper extremity assist;From chair/3-in-1 Stand to Sit: 4: Min guard;With upper extremity assist;To chair/3-in-1 Stand Pivot Transfers: 4: Min guard Details for Transfer Assistance: verbal cues for safe technique, also stand pivot to/from w/c with use of armrests Ambulation/Gait Ambulation/Gait Assistance: 4: Min guard Ambulation Distance (Feet): 34 Feet (then another 30) Assistive device: Rolling walker Ambulation/Gait Assistance Details: ambulated x2 with RW and seated rest break inbetween, pt doing very well with mobility and NWB status Gait Pattern: Step-to pattern General Gait Details: NWB LLE Wheelchair Mobility Wheelchair Mobility: Yes Wheelchair Assistance: 4: Administrator, sports Details (indicate cue type and reason): verbal cues for safe use of w/c and assist for turning only, pt able to propel with upper body and R LE with L LE elevated on leg rest,  assistance for leg rests required but pt able to manage breaks correctly once educated Occupational hygienist: Both upper extremities;Right lower extremity Wheelchair Parts Management: Needs assistance Distance: 120    Exercises General Exercises - Lower Extremity Ankle Circles/Pumps: AROM;Right;15 reps Quad Sets: AROM;Right;Left;10 reps;5 reps;Limitations Quad Sets Limitations: only 5 on L due to knee pain Long Arc Quad: AROM;Right;20 reps;Seated Hip ABduction/ADduction: AROM;Right;20 reps Straight Leg Raises: AAROM;AROM;Right;Left;10 reps;Limitations Straight Leg Raises Limitations: assist for L LE only Hip Flexion/Marching: AROM;Right;20 reps;Seated   PT Diagnosis:    PT Problem List:   PT Treatment Interventions:     PT Goals Acute Rehab PT Goals PT Goal: Sit to Stand - Progress: Progressing toward goal PT Goal: Ambulate - Progress: Progressing toward goal PT Goal: Propel Wheelchair - Progress: Progressing toward goal  Visit Information  Last PT Received On: 12/06/12 Assistance Needed: +1    Subjective Data  Subjective: I'm so happy to see you.  i'd love to get out of this room.   Cognition  Cognition Overall Cognitive Status: Appears within functional limits for tasks assessed/performed Arousal/Alertness: Awake/alert Orientation Level: Appears intact for tasks assessed Behavior During Session: [facility] for tasks performed    Balance     End of Session PT - End of Session Equipment Utilized During Treatment: Gait belt Activity Tolerance: Patient tolerated treatment well Patient left: in chair;with call bell/phone within reach;with chair alarm set   GP     Lawrence Roldan,KATHrine E 12/06/2012, 12:38 PM Zenovia Jarred, PT, DPT 12/06/2012 Pager: [phone]

## 2013-01-12 ENCOUNTER — Non-Acute Institutional Stay (SKILLED_NURSING_FACILITY): Payer: Medicare Other | Admitting: Internal Medicine

## 2013-01-12 DIAGNOSIS — I4891 Unspecified atrial fibrillation: Secondary | ICD-10-CM

## 2013-01-12 DIAGNOSIS — F411 Generalized anxiety disorder: Secondary | ICD-10-CM

## 2013-01-12 DIAGNOSIS — I10 Essential (primary) hypertension: Secondary | ICD-10-CM

## 2013-01-12 DIAGNOSIS — D62 Acute posthemorrhagic anemia: Secondary | ICD-10-CM

## 2013-01-12 DIAGNOSIS — S82202S Unspecified fracture of shaft of left tibia, sequela: Secondary | ICD-10-CM

## 2013-01-12 DIAGNOSIS — F32A Depression, unspecified: Secondary | ICD-10-CM

## 2013-01-12 DIAGNOSIS — K5900 Constipation, unspecified: Secondary | ICD-10-CM

## 2013-01-12 DIAGNOSIS — F329 Major depressive disorder, single episode, unspecified: Secondary | ICD-10-CM

## 2013-01-12 DIAGNOSIS — S8290XS Unspecified fracture of unspecified lower leg, sequela: Secondary | ICD-10-CM

## 2013-01-12 DIAGNOSIS — I480 Paroxysmal atrial fibrillation: Secondary | ICD-10-CM

## 2013-01-12 DIAGNOSIS — F3289 Other specified depressive episodes: Secondary | ICD-10-CM

## 2013-01-20 ENCOUNTER — Non-Acute Institutional Stay (SKILLED_NURSING_FACILITY): Payer: Medicare Other | Admitting: Internal Medicine

## 2013-01-20 DIAGNOSIS — F411 Generalized anxiety disorder: Secondary | ICD-10-CM

## 2013-01-20 DIAGNOSIS — I10 Essential (primary) hypertension: Secondary | ICD-10-CM

## 2013-01-20 DIAGNOSIS — I4891 Unspecified atrial fibrillation: Secondary | ICD-10-CM

## 2013-01-20 DIAGNOSIS — IMO0002 Reserved for concepts with insufficient information to code with codable children: Secondary | ICD-10-CM

## 2013-01-20 DIAGNOSIS — S82202K Unspecified fracture of shaft of left tibia, subsequent encounter for closed fracture with nonunion: Secondary | ICD-10-CM

## 2013-01-20 NOTE — Progress Notes (Signed)
Patient ID: [name] [name], female   DOB: [birth year], 79 y.o.   MRN: [id number]  CC- discharge visit  HPI- 79 y/o female patient was here for STR post hospitalization after a fall with displaced fracture of the proximal shaft of the left tibia and a displaced comminuted fracture proximal shaft of fibula and a displaced oblique fracture of the distal shaft of the fibula. She underwent Open reduction and internal fixation of the left tibia using a Biomet Versa nail 11 x 30 cm, 2 proximal interlocks and 2 distal Interlocks by orthopedics Dr. Charlann Boxer. She was then put on dvt prophylaxis and pain management and sent to SNF for STR. She has worked with therapy and improved in her strength. Her pain is well controlled. She is stable to be discharged home. Patient was seen in her room today. She has no concerns this visit. No concerns from nursing staff. She had a good night sleep, is moving around with a walker and her pain in under control. She has been seen by ortho and is off her brace  Review of Systems  Constitutional: Negative for fever and chills.  Eyes: Negative for blurred vision.  Respiratory: Negative for cough and shortness of breath.   Cardiovascular: Negative for chest pain and palpitations.  Gastrointestinal: Negative for abdominal pain.  Genitourinary: Negative for dysuria.  Neurological: Negative for weakness and headaches.   PHYSICAL EXAM ON DISCHARGE-  Temp 97.5, hr 62/min, bp 140/66, rr 20/min, 02 sat 96% on room air, weight 136 lbs, height 64 inches  General: Elderly female in NAD  HEENT: moist mucus membrane, EOMI, atraumatic Cardiovascular: RRR, nl S1S2   Respiratory: CTAB  Abdomen: Soft +BS NT/ND  Extremities: able to bear weight and walks with walker, trace edema in left leg, off leg brace Neuro: aaox3  Significant Diagnostic Studies: Dg Tibia/fibula Left  12/03/2012  *RADIOLOGY REPORT*  Clinical Data: ORIF fracture  LEFT TIBIA AND FIBULA - 2 VIEW  Comparison: 12/02/2012   Findings: Multiple C-arm images show placement of a tibial nail with proximal and distal securing screws.  Components appear well positioned.  Alignment is near anatomic.  Proximal fibular and distal fibular fractures again demonstrated.  IMPRESSION: Tibial nail placement with near anatomic alignment.   Original Report Authenticated By: Paulina Fusi, M.D.    Dg Tibia/fibula Left  12/02/2012  *RADIOLOGY REPORT*  Clinical Data: Severe injury, fall  LEFT TIBIA AND FIBULA - 2 VIEW  Comparison: None.  Findings: Three views of the left tibia-fibula submitted.  There is displaced fracture of the proximal shaft of the left tibia.  There is a displaced comminuted fracture proximal shaft of the fibula. Displaced oblique fracture of distal shaft of the fibula.  Diffuse osteopenia is noted.  Degenerative changes left knee.  IMPRESSION: There is displaced fracture of the proximal shaft of the left tibia.  There is a displaced comminuted fracture proximal shaft of the fibula.  Displaced oblique fracture of distal shaft of the fibula.   Original Report Authenticated By: Natasha Mead, M.D.    Dg Chest Portable 1 View  12/02/2012  *RADIOLOGY REPORT*  Clinical Data: Leg injury  PORTABLE CHEST - 1 VIEW  Comparison: 04/22/2009  Findings: Lungs remain hyperaerated.  Nodular density at the left base may simply be related to the associated rib.  No definite acute rib fracture.  No pneumothorax.  Aortic knob is distinct. Pulmonary artery is prominent.  IMPRESSION: Nodular density at the left base likely related to the rib. Upright PA and lateral  radiograph is recommended when feasible. Otherwise, no evidence of acute cardiopulmonary disease. Hyperaeration is likely related to COPD.   Original Report Authenticated By: Jolaine Click, M.D.    Dg C-arm 1-60 Min-no Report  12/03/2012  CLINICAL DATA: left tib/fib fracture   C-ARM 1-60 MINUTES  Fluoroscopy was utilized by the requesting physician.  No radiographic  interpretation.      Labs: Basic Metabolic Panel:   Recent Labs Lab  12/02/12 1439  12/03/12 0455  12/04/12 1302  12/05/12 0426   NA  135  136  138  137   K  3.4*  4.0  3.3*  4.2   CL  98  102  102  105   CO2  27  26  27  27    GLUCOSE  126*  102*  133*  97   BUN  13  12  9  15    CREATININE  0.65  0.65  0.69  0.65   CALCIUM  8.8  8.2*  8.5  8.3*    CBC:   Recent Labs Lab  12/02/12 1439  12/03/12 0455  12/04/12 0455  12/05/12 0426  12/06/12 1010   WBC  7.5  6.0  7.7  5.8  5.9   HGB  10.4*  9.3*  8.2*  7.8*  8.6*   HCT  32.6*  28.5*  25.6*  23.7*  26.5*   MCV  85.6  85.3  85.0  85.3  84.7   PLT  331  325  294  269  298    12/12/12 wbc 5, hb 8.7 from 8.4, hct 26, plt 311, na 138, k 4.2, glu 87, bun 10, cr 0.69, alb 3, ca 8.5   ASSESSMENT/PLAN-  Patient has undergone physical and occupational therapy here for lower and upper extremity strengthening. Sh has undergone gait training. Speech therapy has worked with her for her memory skills. Her strength has improved with overall functional transfers. There has been improvement noted with self care tasks as well. She is able to move around with a walker independently. She will need home PT/OT. Will provide a month supply of her prescription medication and to follow with pcp and ortho as outpatient. She will need a two wheeled rolling walker  HTN- continue losartan 100 mg daily, metoprolol succinate 100 mg daily  Depression- continue sertraline 100 mg daily  Closed fracture of shaft of fibula/tibia- continue tramadol 50 mg 1-2 tab q6h prn for pain, methocarbamol 500 mg q8h prn for muscle tightness/ spasm. Off dvt prophylaxis for now. To follow with dr Charlann Boxer in 6 weeks  constipation- continue miralax 17 g daily  Anxiety- stable at present, xanax 0.5 mg q6h prn  A.fib- her heart rate is controlled with current dose of metoprolol. Not on anticoagulation. To follow with pcp as outpatient. Has routine follow up with cardiology  Anemia-  improved  from hospital stay but remains anemic. Will need cbc check 2 weeks post discharge in pcp office

## 2013-02-05 NOTE — Progress Notes (Signed)
Patient ID: [name] [name], female   DOB: Mar 18, 1930, 79 y.o.   MRN: [id number]  Cc- medical management of chronic illness  Allergies  Allergen Reactions  . Codeine Nausea And Vomiting   HPI- 79 y/o female patient is here for STR post hospitalization after a fall with displaced fracture of the proximal shaft of the left tibia and a displaced comminuted fracture proximal shaft of fibula and a displaced oblique fracture of the distal shaft of the fibula. She underwent Open reduction and internal fixation of the left tibia using a Biomet Versa nail 11 x 30 cm, 2 proximal interlocks and 2 distal Interlocks by orthopedics Dr. Charlann Boxer. She was then put on dvt prophylaxis and pain management and sent to SNF for STR. She has worked with therapy and improved in her strength. Her pain is well controlled. she is taking her prescribed medications and is partial weight bearing and has upcoming follow up with orthopedic 01/19/13. Patient was seen in her room today. She has no concerns this visit. She has a brace in her left leg.  Review of Systems  Constitutional: Negative for fever and chills.  Eyes: Negative for blurred vision.  Respiratory: Negative for cough and shortness of breath.   Cardiovascular: Negative for chest pain and palpitations.  Gastrointestinal: Negative for abdominal pain.  Genitourinary: Negative for dysuria.  Neurological: Negative for weakness and headaches.   Past Medical History  Diagnosis Date  . Hypertension   . Anemia   . Hypercholesterolemia   . Cancer     colon  . Paroxysmal atrial fibrillation   . Depression with anxiety   . Aortic aneurysm, thoracic   . Aortic insufficiency   . Thrombocytopenia     Postoperative thrombocytopenia  . Acute blood loss anemia     Postoperative  . S/P AVR (aortic valve replacement) and aortoplasty 09/30/2012   Past Surgical History  Procedure Laterality Date  . Colon surgery  1973    for colon cancer  . Aortic valve replacement   03/20/2009    mild aortic stenosis & mod to severe A1, lt verticular wall thickness was normal & lt ventricular function was estimated to be 55 to 60%,no mitral valve regugitation or stenosis  . Colonoscopy  12/29/2011    Procedure: COLONOSCOPY;  Surgeon: Florencia Reasons, MD;  Location: [facility] ENDOSCOPY;  Service: Endoscopy;  Laterality: N/A;  . Hot hemostasis  12/29/2011    Procedure: HOT HEMOSTASIS (ARGON PLASMA COAGULATION/BICAP);  Surgeon: Florencia Reasons, MD;  Location: [facility] ENDOSCOPY;  Service: Endoscopy;  Laterality: N/A;  . Rotator cuff repair      Right Shoulder  . Colonoscopy  08/30/2012    Procedure: COLONOSCOPY;  Surgeon: Florencia Reasons, MD;  Location: [facility] ENDOSCOPY;  Service: Endoscopy;  Laterality: N/A;  . Hot hemostasis  08/30/2012    Procedure: HOT HEMOSTASIS (ARGON PLASMA COAGULATION/BICAP);  Surgeon: Florencia Reasons, MD;  Location: [facility] ENDOSCOPY;  Service: Endoscopy;  Laterality: N/A;  . Tibia im nail insertion Left 12/03/2012    Procedure: INTRAMEDULLARY (IM) NAIL TIBIAL;  Surgeon: Shelda Pal, MD;  Location: [facility] ORS;  Service: Orthopedics;  Laterality: Left;   Medications reviewed. See MAR  BP 119/66  Pulse 66  Temp(Src) 97.4 F (36.3 C)  Resp 16  Ht 5\' 4"  (1.626 m)  Wt 136 lb (61.689 kg)  BMI 23.33 kg/m2  General: Elderly female in NAD  HEENT: moist mucus membrane, EOMI, atraumatic, normocephalic, no JVD Cardiovascular: RRR, normal S1S2   Respiratory: CTAB ,  no wheeze/ rhonchi Abdomen: Soft +BS NT/ND   Extremities: able to bear weight and walks with walker, trace edema in left leg, has a brace in place, able to move toes well Neuro: aaox3  ASSESSMENT/PLAN-  HTN- continue losartan 100 mg daily, metoprolol succinate 100 mg daily  Depression- continue sertraline 100 mg daily  Closed fracture of shaft of fibula/tibia- continue tramadol 50 mg 1-2 tab q6h prn for pain, methocarbamol 500 mg q8h prn for muscle tightness/ spasm. Partial weight bearing and has appointment  with orthopedics. Continue working with PT/OT. Fall precautions  constipation- continue miralax 17 g daily  Anxiety- stable at present, xanax 0.5 mg q6h prn  A.fib- her heart rate is controlled with current dose of metoprolol. Not on any anticoagulation  Anemia-  improved from hospital stay but remains anemic. No bleed reported, monitor clinically

## 2013-03-06 DIAGNOSIS — F32A Depression, unspecified: Secondary | ICD-10-CM | POA: Insufficient documentation

## 2013-03-06 DIAGNOSIS — F411 Generalized anxiety disorder: Secondary | ICD-10-CM | POA: Insufficient documentation

## 2013-03-06 DIAGNOSIS — F329 Major depressive disorder, single episode, unspecified: Secondary | ICD-10-CM | POA: Insufficient documentation

## 2013-03-06 DIAGNOSIS — K5900 Constipation, unspecified: Secondary | ICD-10-CM | POA: Insufficient documentation

## 2013-03-06 DIAGNOSIS — D62 Acute posthemorrhagic anemia: Secondary | ICD-10-CM | POA: Insufficient documentation

## 2013-03-07 ENCOUNTER — Other Ambulatory Visit: Payer: Self-pay | Admitting: Internal Medicine

## 2013-03-07 DIAGNOSIS — I6522 Occlusion and stenosis of left carotid artery: Secondary | ICD-10-CM

## 2013-03-20 ENCOUNTER — Other Ambulatory Visit: Payer: Medicare Other

## 2013-06-27 ENCOUNTER — Telehealth: Payer: Self-pay | Admitting: Cardiology

## 2013-06-27 NOTE — Telephone Encounter (Signed)
Returned call to patient she stated she tires easy and has been a little sob for the past 2 months.No chest pain.Appointment scheduled with Norma Fredrickson NP 07/18/13.Advised to call sooner if needed.

## 2013-06-27 NOTE — Telephone Encounter (Signed)
New Problem  Pt get very tired very easilly/// SOB//

## 2013-07-18 ENCOUNTER — Encounter: Payer: Self-pay | Admitting: Nurse Practitioner

## 2013-07-18 ENCOUNTER — Ambulatory Visit (INDEPENDENT_AMBULATORY_CARE_PROVIDER_SITE_OTHER): Payer: Medicare Other | Admitting: Nurse Practitioner

## 2013-07-18 VITALS — BP 160/84 | HR 68 | Ht 59.0 in | Wt 135.0 lb

## 2013-07-18 DIAGNOSIS — D500 Iron deficiency anemia secondary to blood loss (chronic): Secondary | ICD-10-CM

## 2013-07-18 DIAGNOSIS — R5383 Other fatigue: Secondary | ICD-10-CM

## 2013-07-18 DIAGNOSIS — Z954 Presence of other heart-valve replacement: Secondary | ICD-10-CM

## 2013-07-18 DIAGNOSIS — E785 Hyperlipidemia, unspecified: Secondary | ICD-10-CM

## 2013-07-18 DIAGNOSIS — Z952 Presence of prosthetic heart valve: Secondary | ICD-10-CM

## 2013-07-18 DIAGNOSIS — R5381 Other malaise: Secondary | ICD-10-CM

## 2013-07-18 LAB — CBC WITH DIFFERENTIAL/PLATELET
Basophils Absolute: 0 10*3/uL (ref 0.0–0.1)
Basophils Relative: 0.4 % (ref 0.0–3.0)
Eosinophils Absolute: 0.2 10*3/uL (ref 0.0–0.7)
Eosinophils Relative: 3.4 % (ref 0.0–5.0)
HCT: 34.1 % — ABNORMAL LOW (ref 36.0–46.0)
Hemoglobin: 11.5 g/dL — ABNORMAL LOW (ref 12.0–15.0)
Lymphocytes Relative: 19 % (ref 12.0–46.0)
Lymphs Abs: 1 10*3/uL (ref 0.7–4.0)
MCHC: 33.7 g/dL (ref 30.0–36.0)
MCV: 83.1 fl (ref 78.0–100.0)
Monocytes Absolute: 0.4 10*3/uL (ref 0.1–1.0)
Monocytes Relative: 8 % (ref 3.0–12.0)
Neutro Abs: 3.6 10*3/uL (ref 1.4–7.7)
Neutrophils Relative %: 69.2 % (ref 43.0–77.0)
Platelets: 246 10*3/uL (ref 150.0–400.0)
RBC: 4.11 Mil/uL (ref 3.87–5.11)
RDW: 15.4 % — ABNORMAL HIGH (ref 11.5–14.6)
WBC: 5.3 10*3/uL (ref 4.5–10.5)

## 2013-07-18 LAB — LIPID PANEL
Cholesterol: 196 mg/dL (ref 0–200)
HDL: 62.9 mg/dL (ref >39.00)
LDL Cholesterol: 118 mg/dL — ABNORMAL HIGH (ref 0–99)
Total CHOL/HDL Ratio: 3
Triglycerides: 78 mg/dL (ref 0.0–149.0)
VLDL: 15.6 mg/dL (ref 0.0–40.0)

## 2013-07-18 LAB — BASIC METABOLIC PANEL
BUN: 12 mg/dL (ref 6–23)
CO2: 28 mEq/L (ref 19–32)
Calcium: 9.3 mg/dL (ref 8.4–10.5)
Chloride: 103 mEq/L (ref 96–112)
Creatinine, Ser: 0.8 mg/dL (ref 0.4–1.2)
GFR: 75.34 mL/min (ref >60.00)
Glucose, Bld: 98 mg/dL (ref 70–99)
Potassium: 4 mEq/L (ref 3.5–5.1)
Sodium: 138 mEq/L (ref 135–145)

## 2013-07-18 LAB — TSH: TSH: 1.71 u[IU]/mL (ref 0.35–5.50)

## 2013-07-18 NOTE — Progress Notes (Signed)
[name] [name] Date of Birth: Oct 16, 1929 Medical Record #[id number]  History of Present Illness: [name] [name] is seen back today for a work in visit. Seen for Dr. [place]il[place]. Former patient of Dr. Silva Bandy. Has had bioprosthetic aortic valve replacement with aortic root grafting in June of 2010 by Dr. Laneta Simmers. Other issues include HTN, stress, anxiety/depression. No known CAD per prior coronary CT - she was never cathed prior to her surgery in 2010.   Comes in today. Here with a good friend. Admits that she is still quite depressed. Not very active. Remains fatigued. Not really feeling that bad. Says she got a letter saying her check up was due. No recent labs. Broke her left fibula/tibia earlier this year - from tripping over a dog. No chest pain. Not really short of breath. No swelling. Not dizzy or lightheaded.   Current Outpatient Prescriptions  Medication Sig Dispense Refill  . ALPRAZolam (XANAX) 0.5 MG tablet Take 0.5 mg by mouth 3 (three) times daily as needed for anxiety.       . Ascorbic Acid (VITAMIN C) 100 MG tablet Take 100 mg by mouth daily.      . cholecalciferol (VITAMIN D) 1000 UNITS tablet Take 1,000 Units by mouth daily.      Marland Kitchen enoxaparin (LOVENOX) 40 MG/0.4ML injection Inject 0.4 mLs (40 mg total) into the skin daily.  14 Syringe  0  . losartan (COZAAR) 100 MG tablet Take 100 mg by mouth daily.       . methocarbamol (ROBAXIN) 500 MG tablet Take 1-2 tablets (500-1,000 mg total) by mouth 3 (three) times daily as needed (for muscle relaxant.).      Marland Kitchen metoprolol succinate (TOPROL-XL) 100 MG 24 hr tablet Take 100 mg by mouth daily. Take with or immediately following a meal.      . sertraline (ZOLOFT) 100 MG tablet Take 100 mg by mouth daily.        . traMADol (ULTRAM) 50 MG tablet Take 1-2 tablets (50-100 mg total) by mouth every 6 (six) hours as needed for pain.  60 tablet  0   No current facility-administered medications for this visit.    Allergies  Allergen Reactions    . Codeine Nausea And Vomiting    Past Medical History  Diagnosis Date  . Hypertension   . Anemia   . Hypercholesterolemia   . Cancer     colon  . Paroxysmal atrial fibrillation   . Depression with anxiety   . Aortic aneurysm, thoracic   . Aortic insufficiency   . Thrombocytopenia     Postoperative thrombocytopenia  . Acute blood loss anemia     Postoperative  . S/P AVR (aortic valve replacement) and aortoplasty 09/30/2012    Past Surgical History  Procedure Laterality Date  . Colon surgery  1973    for colon cancer  . Aortic valve replacement  03/20/2009    mild aortic stenosis & mod to severe A1, lt verticular wall thickness was normal & lt ventricular function was estimated to be 55 to 60%,no mitral valve regugitation or stenosis  . Colonoscopy  12/29/2011    Procedure: COLONOSCOPY;  Surgeon: Florencia Reasons, MD;  Location: [facility] ENDOSCOPY;  Service: Endoscopy;  Laterality: N/A;  . Hot hemostasis  12/29/2011    Procedure: HOT HEMOSTASIS (ARGON PLASMA COAGULATION/BICAP);  Surgeon: Florencia Reasons, MD;  Location: [facility] ENDOSCOPY;  Service: Endoscopy;  Laterality: N/A;  . Rotator cuff repair      Right Shoulder  .  Colonoscopy  08/30/2012    Procedure: COLONOSCOPY;  Surgeon: Florencia Reasons, MD;  Location: [facility] ENDOSCOPY;  Service: Endoscopy;  Laterality: N/A;  . Hot hemostasis  08/30/2012    Procedure: HOT HEMOSTASIS (ARGON PLASMA COAGULATION/BICAP);  Surgeon: Florencia Reasons, MD;  Location: [facility] ENDOSCOPY;  Service: Endoscopy;  Laterality: N/A;  . Tibia im nail insertion Left 12/03/2012    Procedure: INTRAMEDULLARY (IM) NAIL TIBIAL;  Surgeon: Shelda Pal, MD;  Location: [facility] ORS;  Service: Orthopedics;  Laterality: Left;    History  Smoking status  . Never Smoker   Smokeless tobacco  . Never Used    History  Alcohol Use No    Family History  Problem Relation Age of Onset  . Parkinsonism Mother   . Hypertension Father     Review of Systems: The review of systems is  per the HPI.  All other systems were reviewed and are negative.  Physical Exam: BP 160/84  Pulse 68  Ht 4\' 11"  (1.499 m)  Wt 135 lb (61.236 kg)  BMI 27.25 kg/m2 Patient is very pleasant and in no acute distress. Affect is flat - seems depressed to me. Skin is warm and dry. Color is normal.  HEENT is unremarkable. Normocephalic/atraumatic. PERRL. Sclera are nonicteric. Neck is supple. No masses. No JVD. Lungs are clear. Cardiac exam shows a regular rate and rhythm. Aortic valve sounds ok. Abdomen is soft. Extremities are without edema. Gait and ROM are intact. No gross neurologic deficits noted.  LABORATORY DATA:  PENDING  Lab Results  Component Value Date   WBC 5.9 12/06/2012   HGB 8.6* 12/06/2012   HCT 26.5* 12/06/2012   PLT 298 12/06/2012   GLUCOSE 97 12/05/2012   ALT 15 03/18/2009   AST 24 03/18/2009   NA 137 12/05/2012   K 4.2 12/05/2012   CL 105 12/05/2012   CREATININE 0.65 12/05/2012   BUN 15 12/05/2012   CO2 27 12/05/2012   INR 1.7* 03/20/2009   HGBA1C  Value: 5.8 (NOTE) The ADA recommends the following therapeutic goal for glycemic control related to Hgb A1c measurement: Goal of therapy: <6.5 Hgb A1c  Reference: American Diabetes Association: Clinical Practice Recommendations 2010, Diabetes Care, 2010, 33: (Suppl  1). 03/18/2009   Echo Study Conclusions from 2011  - Left ventricle: The cavity size was normal. Wall thickness was normal. Systolic function was normal. The estimated ejection fraction was in the range of 55% to 60%. Wall motion was normal; there were no regional wall motion abnormalities. Doppler parameters are consistent with abnormal left ventricular relaxation (grade 1 diastolic dysfunction). - Aortic valve: AV prosthesis opens well. Trivial regurgitation. Mean gradient: 4mm Hg (S).   Assessment / Plan: 1. Prior replacement of aortic valve and aortic root using a 21-mm Medtronic Freestyle aortic root heart valve prosthesis with reimplantation of the coronary arteries,  resection and grafting of ascending aortic aneurysm using a 28-mm Hemashield tube graft - from 2010 - seems to be doing ok. Will update her echo.  2. Fatigue - seems to be stemming from depression - will update her labs.   3. Depression - I have encouraged her to talk with Dr. Renne Crigler about other options. Has seen psychiatry in the past.   Tentatively see her back in 6 months. Check labs today. No change in medicines today.   Patient is agreeable to this plan and will call if any problems develop in the interim.   Rosalio Macadamia, RN, ANP-C [facility] [phone]  [address] [place], [place]  [address]

## 2013-07-18 NOTE — Patient Instructions (Addendum)
We need to check labs today  We are going to update your echocardiogram  See Dr. Swaziland in 6 months  Talk with Dr. Renne Crigler about your depression and depression medicines  Monitor your blood pressure at home - let us know if it stays above 160 consistently  Call the [facility] office at [phone] if you have any questions, problems or concerns.

## 2013-08-03 ENCOUNTER — Ambulatory Visit (HOSPITAL_COMMUNITY): Payer: Medicare Other | Attending: Internal Medicine | Admitting: Radiology

## 2013-08-03 DIAGNOSIS — I359 Nonrheumatic aortic valve disorder, unspecified: Secondary | ICD-10-CM

## 2013-08-03 DIAGNOSIS — I351 Nonrheumatic aortic (valve) insufficiency: Secondary | ICD-10-CM

## 2013-08-03 DIAGNOSIS — D500 Iron deficiency anemia secondary to blood loss (chronic): Secondary | ICD-10-CM

## 2013-08-03 DIAGNOSIS — Z952 Presence of prosthetic heart valve: Secondary | ICD-10-CM | POA: Insufficient documentation

## 2013-08-03 DIAGNOSIS — I4891 Unspecified atrial fibrillation: Secondary | ICD-10-CM | POA: Insufficient documentation

## 2013-08-03 DIAGNOSIS — R5381 Other malaise: Secondary | ICD-10-CM | POA: Insufficient documentation

## 2013-08-03 DIAGNOSIS — I712 Thoracic aortic aneurysm, without rupture: Secondary | ICD-10-CM

## 2013-08-03 DIAGNOSIS — E785 Hyperlipidemia, unspecified: Secondary | ICD-10-CM

## 2013-08-03 DIAGNOSIS — C269 Malignant neoplasm of ill-defined sites within the digestive system: Secondary | ICD-10-CM | POA: Insufficient documentation

## 2013-08-03 DIAGNOSIS — I10 Essential (primary) hypertension: Secondary | ICD-10-CM | POA: Insufficient documentation

## 2013-08-03 NOTE — Progress Notes (Signed)
Echocardiogram performed.  

## 2013-09-18 ENCOUNTER — Encounter: Payer: Self-pay | Admitting: Cardiology

## 2013-09-18 ENCOUNTER — Ambulatory Visit (INDEPENDENT_AMBULATORY_CARE_PROVIDER_SITE_OTHER): Payer: Medicare Other | Admitting: Cardiology

## 2013-09-18 VITALS — BP 166/86 | HR 101 | Ht 59.0 in | Wt 132.0 lb

## 2013-09-18 DIAGNOSIS — Z954 Presence of other heart-valve replacement: Secondary | ICD-10-CM

## 2013-09-18 DIAGNOSIS — E7800 Pure hypercholesterolemia, unspecified: Secondary | ICD-10-CM

## 2013-09-18 DIAGNOSIS — I712 Thoracic aortic aneurysm, without rupture: Secondary | ICD-10-CM

## 2013-09-18 DIAGNOSIS — Z952 Presence of prosthetic heart valve: Secondary | ICD-10-CM

## 2013-09-18 DIAGNOSIS — I10 Essential (primary) hypertension: Secondary | ICD-10-CM

## 2013-09-18 MED ORDER — HYDROCHLOROTHIAZIDE 25 MG PO TABS: 25.0000 mg | ORAL_TABLET | Freq: Every day | ORAL | Status: DC

## 2013-09-18 MED ORDER — POTASSIUM CHLORIDE CRYS ER 20 MEQ PO TBCR: 20.0000 meq | EXTENDED_RELEASE_TABLET | Freq: Every day | ORAL | Status: DC

## 2013-09-18 NOTE — Patient Instructions (Signed)
Start HCTZ 25 mg daily for blood pressure and take potassium 20 meq daily  Continue your other medication  I will see you in 6 months. Follow up with Dr. Renne Crigler in January.

## 2013-09-18 NOTE — Progress Notes (Signed)
[name] [name] Date of Birth: [birth year] Medical Record #[id number]  History of Present Illness: [name] [name] is seen back today for a follow up visit. She had bioprosthetic aortic valve replacement with aortic root grafting in June of 2010 by [name] [name]. Other issues include HTN, stress, anxiety/depression. No known CAD per prior coronary CT - she was never cathed prior to her surgery in 2010.   On follow up today she denies any cardiac complaints. No SOB, chest pain, or palpitations. Echo in October was unremarkable. BP consistently high. Stays depressed and nervous a lot.  Current Outpatient Prescriptions  Medication Sig Dispense Refill  . ALPRAZolam (XANAX) 0.5 MG tablet Take 0.5 mg by mouth 3 (three) times daily as needed for anxiety.       . Ascorbic Acid (VITAMIN C) 100 MG tablet Take 100 mg by mouth daily.      . cholecalciferol (VITAMIN D) 1000 UNITS tablet Take 1,000 Units by mouth daily.      Marland Kitchen losartan (COZAAR) 100 MG tablet Take 100 mg by mouth daily.       . metoprolol succinate (TOPROL-XL) 100 MG 24 hr tablet Take 100 mg by mouth daily. Take with or immediately following a meal.      . sertraline (ZOLOFT) 100 MG tablet Take 100 mg by mouth daily.        . traMADol (ULTRAM) 50 MG tablet Take 1-2 tablets (50-100 mg total) by mouth every 6 (six) hours as needed for pain.  60 tablet  0  . hydrochlorothiazide (HYDRODIURIL) 25 MG tablet Take 1 tablet (25 mg total) by mouth daily.  30 tablet  11  . potassium chloride SA (K-DUR,KLOR-CON) 20 MEQ tablet Take 1 tablet (20 mEq total) by mouth daily.  30 tablet  11   No current facility-administered medications for this visit.    Allergies  Allergen Reactions  . Codeine Nausea And Vomiting    Past Medical History  Diagnosis Date  . Hypertension   . Anemia   . Hypercholesterolemia   . Cancer     colon  . Paroxysmal atrial fibrillation   . Depression with anxiety   . Aortic aneurysm, thoracic   . Aortic insufficiency    . Thrombocytopenia     Postoperative thrombocytopenia  . Acute blood loss anemia     Postoperative  . S/P AVR (aortic valve replacement) and aortoplasty 09/30/2012    Past Surgical History  Procedure Laterality Date  . Colon surgery  1973    for colon cancer  . Aortic valve replacement  03/20/2009    mild aortic stenosis & mod to severe A1, lt verticular wall thickness was normal & lt ventricular function was estimated to be 55 to 60%,no mitral valve regugitation or stenosis  . Colonoscopy  12/29/2011    Procedure: COLONOSCOPY;  Surgeon: [name] Reasons, MD;  Location: [facility] ENDOSCOPY;  Service: Endoscopy;  Laterality: N/A;  . Hot hemostasis  12/29/2011    Procedure: HOT HEMOSTASIS (ARGON PLASMA COAGULATION/BICAP);  Surgeon: [name] Reasons, MD;  Location: [facility] ENDOSCOPY;  Service: Endoscopy;  Laterality: N/A;  . Rotator cuff repair      Right Shoulder  . Colonoscopy  08/30/2012    Procedure: COLONOSCOPY;  Surgeon: [name] Reasons, MD;  Location: [facility] ENDOSCOPY;  Service: Endoscopy;  Laterality: N/A;  . Hot hemostasis  08/30/2012    Procedure: HOT HEMOSTASIS (ARGON PLASMA COAGULATION/BICAP);  Surgeon: [name] Reasons, MD;  Location: [facility] ENDOSCOPY;  Service: Endoscopy;  Laterality:  N/A;  . Tibia im nail insertion Left 12/03/2012    Procedure: INTRAMEDULLARY (IM) NAIL TIBIAL;  Surgeon: [name] Pal, MD;  Location: [facility] ORS;  Service: Orthopedics;  Laterality: Left;    History  Smoking status  . Never Smoker   Smokeless tobacco  . Never Used    History  Alcohol Use No    Family History  Problem Relation Age of Onset  . Parkinsonism [name] [name]   . Hypertension [name] [name]     Review of Systems: The review of systems is per the HPI.  All other systems were reviewed and are negative.  Physical Exam: BP 166/86  Pulse 101  Ht 4\' 11"  (1.499 m)  Wt 132 lb (59.875 kg)  BMI 26.65 kg/m2 Patient is very pleasant and in no acute distress. Skin is warm and dry. Color is normal.  HEENT is  unremarkable. Normocephalic/atraumatic. PERRL. Sclera are nonicteric. Neck is supple. No masses. No JVD. Lungs are clear. Cardiac exam shows a regular rate and rhythm. Aortic valve sounds ok. Abdomen is soft. Extremities are without edema. Gait and ROM are intact. No gross neurologic deficits noted.  LABORATORY DATA:  PENDING  Lab Results  Component Value Date   WBC 5.3 07/18/2013   HGB 11.5* 07/18/2013   HCT 34.1* 07/18/2013   PLT 246.0 07/18/2013   GLUCOSE 98 07/18/2013   CHOL 196 07/18/2013   TRIG 78.0 07/18/2013   HDL 62.90 07/18/2013   LDLCALC 118* 07/18/2013   ALT 15 03/18/2009   AST 24 03/18/2009   NA 138 07/18/2013   K 4.0 07/18/2013   CL 103 07/18/2013   CREATININE 0.8 07/18/2013   BUN 12 07/18/2013   CO2 28 07/18/2013   TSH 1.71 07/18/2013   INR 1.7* 03/20/2009   HGBA1C  Value: 5.8 (NOTE) The ADA recommends the following therapeutic goal for glycemic control related to Hgb A1c measurement: Goal of therapy: <6.5 Hgb A1c  Reference: American Diabetes Association: Clinical Practice Recommendations 2010, Diabetes Care, 2010, 33: (Suppl  1). 03/18/2009   Echo Study Conclusions from October 2014  Study Conclusions  - Left ventricle: The cavity size was normal. Wall thickness was increased in a pattern of mild LVH. Systolic function was normal. The estimated ejection fraction was in the range of 60% to 65%. Doppler parameters are consistent with abnormal left ventricular relaxation (grade 1 diastolic dysfunction). - Aortic valve: Aortic valve prosthesis opens well. - Pulmonary arteries: PA peak pressure: 32mm Hg (S).     Assessment / Plan: 1.S/p aortic valve and aortic root using a 21-mm Medtronic Freestyle aortic root heart valve prosthesis with reimplantation of the coronary arteries, resection and grafting of ascending aortic aneurysm using a 28-mm Hemashield tube graft - from 2010 - doing well. Echo is OK. I will follow up in 6 months.  2. HTN poorly controlled. Will continue metoprolol  and losartan. Add HCTZ 25 mg daily and potassium 20 meq daily. She has a follow up with Dr. Renne [name] in January and will need renal parameters checked then.  3. Depression

## 2014-05-01 ENCOUNTER — Ambulatory Visit (INDEPENDENT_AMBULATORY_CARE_PROVIDER_SITE_OTHER): Payer: Medicare Other | Admitting: Cardiology

## 2014-05-01 ENCOUNTER — Encounter: Payer: Self-pay | Admitting: Cardiology

## 2014-05-01 VITALS — BP 176/88 | HR 68 | Ht 64.0 in | Wt 136.9 lb

## 2014-05-01 DIAGNOSIS — Z952 Presence of prosthetic heart valve: Secondary | ICD-10-CM

## 2014-05-01 DIAGNOSIS — I351 Nonrheumatic aortic (valve) insufficiency: Secondary | ICD-10-CM

## 2014-05-01 DIAGNOSIS — I359 Nonrheumatic aortic valve disorder, unspecified: Secondary | ICD-10-CM

## 2014-05-01 DIAGNOSIS — I712 Thoracic aortic aneurysm, without rupture, unspecified: Secondary | ICD-10-CM

## 2014-05-01 DIAGNOSIS — Z954 Presence of other heart-valve replacement: Secondary | ICD-10-CM

## 2014-05-01 DIAGNOSIS — I10 Essential (primary) hypertension: Secondary | ICD-10-CM

## 2014-05-01 NOTE — Patient Instructions (Signed)
Continue your current therapy  Be sure to take HCTZ 25 mg daily  I will see you in 6 months.

## 2014-05-01 NOTE — Progress Notes (Signed)
[name] [name] Date of Birth: [birth year] Medical Record #[id number]  History of Present Illness: [name] [name] is seen back today for a follow up visit. She had bioprosthetic aortic valve replacement with aortic root grafting in June of 2010 by Dr. Cyndia Bent for a large thoracic aneurysm and AI. Other issues include HTN, stress, anxiety/depression. No known CAD per prior coronary CT - she was never cathed prior to her surgery in 2010.   On follow up today she denies any cardiac complaints. No SOB, chest pain, or palpitations. Echo in October 2014 was unremarkable. She reports her blood pressure at home is OK but admits that she hasn't taken it in a long time. She was prescribed HCTZ on her last visit but only took it one month-then didn't refill.   Current Outpatient Prescriptions  Medication Sig Dispense Refill  . ALPRAZolam (XANAX) 0.5 MG tablet Take 0.5 mg by mouth 3 (three) times daily as needed for anxiety.       . Ascorbic Acid (VITAMIN C) 100 MG tablet Take 100 mg by mouth daily.      . cholecalciferol (VITAMIN D) 1000 UNITS tablet Take 1,000 Units by mouth daily.      . fluconazole (DIFLUCAN) 100 MG tablet Take 1 tablet by mouth daily as needed.      . hydrochlorothiazide (HYDRODIURIL) 25 MG tablet Take 1 tablet (25 mg total) by mouth daily.  30 tablet  11  . losartan (COZAAR) 100 MG tablet Take 100 mg by mouth daily.       . metoprolol succinate (TOPROL-XL) 100 MG 24 hr tablet Take 100 mg by mouth daily. Take with or immediately following a meal.      . sertraline (ZOLOFT) 100 MG tablet Take 100 mg by mouth daily.        . traMADol (ULTRAM) 50 MG tablet Take 1-2 tablets (50-100 mg total) by mouth every 6 (six) hours as needed for pain.  60 tablet  0   No current facility-administered medications for this visit.    Allergies  Allergen Reactions  . Codeine Nausea And Vomiting    Past Medical History  Diagnosis Date  . Hypertension   . Anemia   . Hypercholesterolemia   .  Cancer     colon  . Paroxysmal atrial fibrillation   . Depression with anxiety   . Aortic aneurysm, thoracic   . Aortic insufficiency   . Thrombocytopenia     Postoperative thrombocytopenia  . Acute blood loss anemia     Postoperative  . S/P AVR (aortic valve replacement) and aortoplasty 09/30/2012    Past Surgical History  Procedure Laterality Date  . Colon surgery  1973    for colon cancer  . Aortic valve replacement  03/20/2009    mild aortic stenosis & mod to severe A1, lt verticular wall thickness was normal & lt ventricular function was estimated to be 55 to 60%,no mitral valve regugitation or stenosis  . Colonoscopy  12/29/2011    Procedure: COLONOSCOPY;  Surgeon: Cleotis Nipper, MD;  Location: [facility] ENDOSCOPY;  Service: Endoscopy;  Laterality: N/A;  . Hot hemostasis  12/29/2011    Procedure: HOT HEMOSTASIS (ARGON PLASMA COAGULATION/BICAP);  Surgeon: Cleotis Nipper, MD;  Location: [facility] ENDOSCOPY;  Service: Endoscopy;  Laterality: N/A;  . Rotator cuff repair      Right Shoulder  . Colonoscopy  08/30/2012    Procedure: COLONOSCOPY;  Surgeon: Cleotis Nipper, MD;  Location: [facility] ENDOSCOPY;  Service: Endoscopy;  Laterality: N/A;  . Hot hemostasis  08/30/2012    Procedure: HOT HEMOSTASIS (ARGON PLASMA COAGULATION/BICAP);  Surgeon: Cleotis Nipper, MD;  Location: [facility] ENDOSCOPY;  Service: Endoscopy;  Laterality: N/A;  . Tibia im nail insertion Left 12/03/2012    Procedure: INTRAMEDULLARY (IM) NAIL TIBIAL;  Surgeon: Mauri Pole, MD;  Location: [facility] ORS;  Service: Orthopedics;  Laterality: Left;    History  Smoking status  . Never Smoker   Smokeless tobacco  . Never Used    History  Alcohol Use No    Family History  Problem Relation Age of Onset  . Parkinsonism Mother   . Hypertension Father     Review of Systems: The review of systems is per the HPI.  All other systems were reviewed and are negative.  Physical Exam: BP 176/88  Pulse 68  Ht 5\' 4"  (1.626 m)  Wt 136 lb  14.4 oz (62.097 kg)  BMI 23.49 kg/m2 Patient is very pleasant and in no acute distress. Skin is warm and dry. Color is normal.  HEENT is unremarkable. Normocephalic/atraumatic. PERRL. Sclera are nonicteric. Neck is supple. No masses. No JVD. Lungs are clear. Cardiac exam shows a regular rate and rhythm. Aortic valve sounds ok. Abdomen is soft. Extremities are without edema. Gait and ROM are intact. No gross neurologic deficits noted.  LABORATORY DATA:  PENDING  Lab Results  Component Value Date   WBC 5.3 07/18/2013   HGB 11.5* 07/18/2013   HCT 34.1* 07/18/2013   PLT 246.0 07/18/2013   GLUCOSE 98 07/18/2013   CHOL 196 07/18/2013   TRIG 78.0 07/18/2013   HDL 62.90 07/18/2013   LDLCALC 118* 07/18/2013   ALT 15 03/18/2009   AST 24 03/18/2009   NA 138 07/18/2013   K 4.0 07/18/2013   CL 103 07/18/2013   CREATININE 0.8 07/18/2013   BUN 12 07/18/2013   CO2 28 07/18/2013   TSH 1.71 07/18/2013   INR 1.7* 03/20/2009   HGBA1C  Value: 5.8 (NOTE) The ADA recommends the following therapeutic goal for glycemic control related to Hgb A1c measurement: Goal of therapy: <6.5 Hgb A1c  Reference: American Diabetes Association: Clinical Practice Recommendations 2010, Diabetes Care, 2010, 33: (Suppl  1). 03/18/2009   Echo Study Conclusions from October 2014  Study Conclusions  - Left ventricle: The cavity size was normal. Wall thickness was increased in a pattern of mild LVH. Systolic function was normal. The estimated ejection fraction was in the range of 60% to 65%. Doppler parameters are consistent with abnormal left ventricular relaxation (grade 1 diastolic dysfunction). - Aortic valve: Aortic valve prosthesis opens well. - Pulmonary arteries: PA peak pressure: 54mm Hg (S).     Assessment / Plan: 1.S/p aortic valve and aortic root using a 21-mm Medtronic Freestyle aortic root heart valve prosthesis with reimplantation of the coronary arteries, resection and grafting of ascending aortic aneurysm using a 28-mm  Hemashield tube graft - from 2010 - doing well. Echo was OK. I will follow up in 6 months.  2. HTN poorly controlled. Will continue metoprolol and losartan. Resume HCTZ 25 mg daily. Needs to monitor BP closer at home. Avoid salt.  3. Depression

## 2014-07-24 ENCOUNTER — Other Ambulatory Visit: Payer: Self-pay | Admitting: Internal Medicine

## 2014-07-24 DIAGNOSIS — R0989 Other specified symptoms and signs involving the circulatory and respiratory systems: Secondary | ICD-10-CM

## 2014-08-09 ENCOUNTER — Ambulatory Visit
Admission: RE | Admit: 2014-08-09 | Discharge: 2014-08-09 | Disposition: A | Discharge status: Home / Self Care | Payer: Medicare Other | Source: Ambulatory Visit | Attending: Internal Medicine | Admitting: Internal Medicine

## 2014-08-09 DIAGNOSIS — R0989 Other specified symptoms and signs involving the circulatory and respiratory systems: Secondary | ICD-10-CM

## 2014-08-14 ENCOUNTER — Other Ambulatory Visit: Payer: Self-pay | Admitting: Internal Medicine

## 2014-08-14 DIAGNOSIS — D3002 Benign neoplasm of left kidney: Secondary | ICD-10-CM

## 2014-10-19 ENCOUNTER — Ambulatory Visit (INDEPENDENT_AMBULATORY_CARE_PROVIDER_SITE_OTHER): Payer: Self-pay | Admitting: Cardiology

## 2014-10-19 ENCOUNTER — Encounter: Payer: Self-pay | Admitting: Cardiology

## 2014-10-19 VITALS — BP 144/78 | HR 74 | Ht 63.0 in | Wt 139.3 lb

## 2014-10-19 DIAGNOSIS — I10 Essential (primary) hypertension: Secondary | ICD-10-CM

## 2014-10-19 DIAGNOSIS — I351 Nonrheumatic aortic (valve) insufficiency: Secondary | ICD-10-CM

## 2014-10-19 DIAGNOSIS — I712 Thoracic aortic aneurysm, without rupture, unspecified: Secondary | ICD-10-CM

## 2014-10-19 DIAGNOSIS — Z954 Presence of other heart-valve replacement: Secondary | ICD-10-CM

## 2014-10-19 DIAGNOSIS — Z952 Presence of prosthetic heart valve: Secondary | ICD-10-CM

## 2014-10-19 MED ORDER — LOSARTAN POTASSIUM-HCTZ 100-12.5 MG PO TABS: 1.0000 | ORAL_TABLET | Freq: Every day | ORAL | Status: DC

## 2014-10-19 NOTE — Patient Instructions (Addendum)
Continue your current therapy  I will see you in 6 months.   

## 2014-10-19 NOTE — Progress Notes (Signed)
[name] [name] Date of Birth: August 21, 1930 Medical Record #[id number]  History of Present Illness: [name] [name] is seen back today for follow up AV disease. She had bioprosthetic aortic valve replacement with aortic root grafting in June of 2010 by Dr. Cyndia Bent for a large thoracic aneurysm and AI. Other issues include HTN, stress, anxiety/depression. No known CAD per prior coronary CT - she was never cathed prior to her surgery in 2010.   On follow up today she denies any cardiac complaints. No SOB, chest pain, or palpitations. Echo in October 2014 was unremarkable. Her BP meds were changed and she is now taking losartan HCT.  Current Outpatient Prescriptions  Medication Sig Dispense Refill  . ALPRAZolam (XANAX) 0.5 MG tablet Take 0.5 mg by mouth 3 (three) times daily as needed for anxiety.     . Ascorbic Acid (VITAMIN C) 100 MG tablet Take 100 mg by mouth daily.    . cholecalciferol (VITAMIN D) 1000 UNITS tablet Take 1,000 Units by mouth daily.    . metoprolol succinate (TOPROL-XL) 100 MG 24 hr tablet Take 100 mg by mouth daily. Take with or immediately following a meal.    . sertraline (ZOLOFT) 100 MG tablet Take 100 mg by mouth daily.      . traMADol (ULTRAM) 50 MG tablet Take 1-2 tablets (50-100 mg total) by mouth every 6 (six) hours as needed for pain. 60 tablet 0  . losartan-hydrochlorothiazide (HYZAAR) 100-12.5 MG per tablet Take 1 tablet by mouth daily.     No current facility-administered medications for this visit.    Allergies  Allergen Reactions  . Codeine Nausea And Vomiting    Past Medical History  Diagnosis Date  . Hypertension   . Anemia   . Hypercholesterolemia   . Cancer     colon  . Paroxysmal atrial fibrillation   . Depression with anxiety   . Aortic aneurysm, thoracic   . Aortic insufficiency   . Thrombocytopenia     Postoperative thrombocytopenia  . Acute blood loss anemia     Postoperative  . S/P AVR (aortic valve replacement) and aortoplasty  09/30/2012    Past Surgical History  Procedure Laterality Date  . Colon surgery  1973    for colon cancer  . Aortic valve replacement  03/20/2009    mild aortic stenosis & mod to severe A1, lt verticular wall thickness was normal & lt ventricular function was estimated to be 55 to 60%,no mitral valve regugitation or stenosis  . Colonoscopy  12/29/2011    Procedure: COLONOSCOPY;  Surgeon: Cleotis Nipper, MD;  Location: [facility] ENDOSCOPY;  Service: Endoscopy;  Laterality: N/A;  . Hot hemostasis  12/29/2011    Procedure: HOT HEMOSTASIS (ARGON PLASMA COAGULATION/BICAP);  Surgeon: Cleotis Nipper, MD;  Location: [facility] ENDOSCOPY;  Service: Endoscopy;  Laterality: N/A;  . Rotator cuff repair      Right Shoulder  . Colonoscopy  08/30/2012    Procedure: COLONOSCOPY;  Surgeon: Cleotis Nipper, MD;  Location: [facility] ENDOSCOPY;  Service: Endoscopy;  Laterality: N/A;  . Hot hemostasis  08/30/2012    Procedure: HOT HEMOSTASIS (ARGON PLASMA COAGULATION/BICAP);  Surgeon: Cleotis Nipper, MD;  Location: [facility] ENDOSCOPY;  Service: Endoscopy;  Laterality: N/A;  . Tibia im nail insertion Left 12/03/2012    Procedure: INTRAMEDULLARY (IM) NAIL TIBIAL;  Surgeon: Mauri Pole, MD;  Location: [facility] ORS;  Service: Orthopedics;  Laterality: Left;    History  Smoking status  . Never Smoker   Smokeless  tobacco  . Never Used    History  Alcohol Use No    Family History  Problem Relation Age of Onset  . Parkinsonism Mother   . Hypertension Father     Review of Systems: The review of systems is per the HPI.  All other systems were reviewed and are negative.  Physical Exam: BP 144/78 mmHg  Pulse 74  Ht 5\' 3"  (1.6 m)  Wt 139 lb 4.8 oz (63.186 kg)  BMI 24.68 kg/m2 Patient is very pleasant and in no acute distress. Skin is warm and dry. Color is normal.  HEENT is unremarkable. Normocephalic/atraumatic. PERRL. Sclera are nonicteric. Neck is supple. No masses. No JVD. Lungs are clear. Cardiac exam shows a regular rate  and rhythm. Aortic valve sounds ok. Abdomen is soft. Extremities are without edema. Gait and ROM are intact. No gross neurologic deficits noted.  LABORATORY DATA:  PENDING  Lab Results  Component Value Date   WBC 5.3 07/18/2013   HGB 11.5* 07/18/2013   HCT 34.1* 07/18/2013   PLT 246.0 07/18/2013   GLUCOSE 98 07/18/2013   CHOL 196 07/18/2013   TRIG 78.0 07/18/2013   HDL 62.90 07/18/2013   LDLCALC 118* 07/18/2013   ALT 15 03/18/2009   AST 24 03/18/2009   NA 138 07/18/2013   K 4.0 07/18/2013   CL 103 07/18/2013   CREATININE 0.8 07/18/2013   BUN 12 07/18/2013   CO2 28 07/18/2013   TSH 1.71 07/18/2013   INR 1.7* 03/20/2009   HGBA1C  03/18/2009    5.8 (NOTE) The ADA recommends the following therapeutic goal for glycemic control related to Hgb A1c measurement: Goal of therapy: <6.5 Hgb A1c  Reference: American Diabetes Association: Clinical Practice Recommendations 2010, Diabetes Care, 2010, 33: (Suppl  1).   Echo Study Conclusions from October 2014  Study Conclusions  - Left ventricle: The cavity size was normal. Wall thickness was increased in a pattern of mild LVH. Systolic function was normal. The estimated ejection fraction was in the range of 60% to 65%. Doppler parameters are consistent with abnormal left ventricular relaxation (grade 1 diastolic dysfunction). - Aortic valve: Aortic valve prosthesis opens well. - Pulmonary arteries: PA peak pressure: 3mm Hg (S).     Assessment / Plan: 1.S/p aortic valve and aortic root using a 21-mm Medtronic Freestyle aortic root heart valve prosthesis with reimplantation of the coronary arteries, resection and grafting of ascending aortic aneurysm using a 28-mm Hemashield tube graft - from 2010 - doing well. Echo was OK. I will follow up in 6 months.  2. HTN improved control related to HCTZ. Will continue current meds.. Avoid salt.  3. Depression

## 2014-12-24 ENCOUNTER — Ambulatory Visit: Payer: Medicare Other | Admitting: Cardiology

## 2015-02-08 ENCOUNTER — Other Ambulatory Visit: Payer: Self-pay | Admitting: Gastroenterology

## 2015-02-18 ENCOUNTER — Other Ambulatory Visit: Payer: Medicare Other

## 2015-03-19 ENCOUNTER — Encounter: Payer: Self-pay | Admitting: Cardiology

## 2015-04-18 ENCOUNTER — Ambulatory Visit: Payer: Medicare Other | Admitting: Cardiology

## 2015-08-12 ENCOUNTER — Other Ambulatory Visit: Payer: Self-pay | Admitting: *Deleted

## 2015-11-28 DIAGNOSIS — I10 Essential (primary) hypertension: Secondary | ICD-10-CM | POA: Diagnosis not present

## 2015-11-28 DIAGNOSIS — F411 Generalized anxiety disorder: Secondary | ICD-10-CM | POA: Diagnosis not present

## 2015-12-02 ENCOUNTER — Ambulatory Visit (INDEPENDENT_AMBULATORY_CARE_PROVIDER_SITE_OTHER): Payer: Medicare Other | Admitting: Cardiology

## 2015-12-02 ENCOUNTER — Encounter: Payer: Self-pay | Admitting: Cardiology

## 2015-12-02 VITALS — BP 142/84 | HR 99 | Ht 63.0 in | Wt 133.8 lb

## 2015-12-02 DIAGNOSIS — I10 Essential (primary) hypertension: Secondary | ICD-10-CM | POA: Diagnosis not present

## 2015-12-02 DIAGNOSIS — I712 Thoracic aortic aneurysm, without rupture, unspecified: Secondary | ICD-10-CM

## 2015-12-02 DIAGNOSIS — Z952 Presence of prosthetic heart valve: Secondary | ICD-10-CM

## 2015-12-02 DIAGNOSIS — Z954 Presence of other heart-valve replacement: Secondary | ICD-10-CM

## 2015-12-02 DIAGNOSIS — E7800 Pure hypercholesterolemia, unspecified: Secondary | ICD-10-CM

## 2015-12-02 NOTE — Patient Instructions (Signed)
Continue your current therapy  I will see you in one year   

## 2015-12-02 NOTE — Progress Notes (Signed)
[name] [name] Date of Birth: Jan 24, 1930 Medical Record [id number]  History of Present Illness: [name] [name] is seen back today for follow up AV disease. She had bioprosthetic aortic valve replacement with aortic root grafting in June of 2010 by Dr. Cyndia Bent for a large thoracic aneurysm and AI. Other issues include HTN,  anxiety/depression. No known CAD per prior coronary CT - she was never cathed prior to her surgery in 2010.   On follow up today she denies any cardiac complaints. No SOB, chest pain, or palpitations. Echo in October 2014 was unremarkable. Her BP meds were changed and is now on Avapro and amlodipine. HCTZ and metoprolol were stopped. Losartan was switched to irbesartan.   Current Outpatient Prescriptions  Medication Sig Dispense Refill  . ALPRAZolam (XANAX) 0.5 MG tablet Take 0.5 mg by mouth 3 (three) times daily as needed for anxiety.     Marland Kitchen amLODipine (NORVASC) 5 MG tablet Take 1 tablet by mouth daily.  0  . Ascorbic Acid (VITAMIN C) 100 MG tablet Take 100 mg by mouth daily.    . cholecalciferol (VITAMIN D) 1000 UNITS tablet Take 1,000 Units by mouth daily.    . irbesartan (AVAPRO) 300 MG tablet Take 1 tablet by mouth daily.  0  . traMADol (ULTRAM) 50 MG tablet Take 1-2 tablets (50-100 mg total) by mouth every 6 (six) hours as needed for pain. 60 tablet 0   No current facility-administered medications for this visit.    Allergies  Allergen Reactions  . Codeine Nausea And Vomiting    Past Medical History  Diagnosis Date  . Hypertension   . Anemia   . Hypercholesterolemia   . Cancer ([place])     colon  . Paroxysmal atrial fibrillation (HCC)   . Depression with anxiety   . Aortic aneurysm, thoracic ([place])   . Aortic insufficiency   . Thrombocytopenia (HCC)     Postoperative thrombocytopenia  . Acute blood loss anemia     Postoperative  . S/P AVR (aortic valve replacement) and aortoplasty 09/30/2012    Past Surgical History  Procedure Laterality Date   . Colon surgery  1973    for colon cancer  . Aortic valve replacement  03/20/2009    mild aortic stenosis & mod to severe A1, lt verticular wall thickness was normal & lt ventricular function was estimated to be 55 to 60%,no mitral valve regugitation or stenosis  . Colonoscopy  12/29/2011    Procedure: COLONOSCOPY;  Surgeon: Cleotis Nipper, MD;  Location: [facility] ENDOSCOPY;  Service: Endoscopy;  Laterality: N/A;  . Hot hemostasis  12/29/2011    Procedure: HOT HEMOSTASIS (ARGON PLASMA COAGULATION/BICAP);  Surgeon: Cleotis Nipper, MD;  Location: [facility] ENDOSCOPY;  Service: Endoscopy;  Laterality: N/A;  . Rotator cuff repair      Right Shoulder  . Colonoscopy  08/30/2012    Procedure: COLONOSCOPY;  Surgeon: Cleotis Nipper, MD;  Location: [facility] ENDOSCOPY;  Service: Endoscopy;  Laterality: N/A;  . Hot hemostasis  08/30/2012    Procedure: HOT HEMOSTASIS (ARGON PLASMA COAGULATION/BICAP);  Surgeon: Cleotis Nipper, MD;  Location: [facility] ENDOSCOPY;  Service: Endoscopy;  Laterality: N/A;  . Tibia im nail insertion Left 12/03/2012    Procedure: INTRAMEDULLARY (IM) NAIL TIBIAL;  Surgeon: Mauri Pole, MD;  Location: [facility] ORS;  Service: Orthopedics;  Laterality: Left;    History  Smoking status  . Never Smoker   Smokeless tobacco  . Never Used    History  Alcohol Use No  Family History  Problem Relation Age of Onset  . Parkinsonism Mother   . Hypertension Father     Review of Systems: The review of systems is per the HPI.  All other systems were reviewed and are negative.  Physical Exam: BP 142/84 mmHg  Pulse 99  Ht 5\' 3"  (1.6 m)  Wt 60.691 kg (133 lb 12.8 oz)  BMI 23.71 kg/m2 Patient is very pleasant and in no acute distress. Skin is warm and dry. Color is normal.  HEENT is unremarkable. Normocephalic/atraumatic. PERRL. Sclera are nonicteric. Neck is supple. No masses. No JVD. Lungs are clear. Cardiac exam shows a regular rate and rhythm. Aortic valve sounds ok. No murmur or gallop. Abdomen is  soft. Extremities are without edema. Gait and ROM are intact. No gross neurologic deficits noted.  LABORATORY DATA:   Echo Study Conclusions from October 2014  Study Conclusions  - Left ventricle: The cavity size was normal. Wall thickness was increased in a pattern of mild LVH. Systolic function was normal. The estimated ejection fraction was in the range of 60% to 65%. Doppler parameters are consistent with abnormal left ventricular relaxation (grade 1 diastolic dysfunction). - Aortic valve: Aortic valve prosthesis opens well. - Pulmonary arteries: PA peak pressure: 65mm Hg (S).   Ecg today shows NSR PACs. Rate 99. ST -T changes consistent with inferolateral ischemia.   Assessment / Plan: 1.S/p aortic valve and aortic root using a 21-mm Medtronic Freestyle aortic root heart valve prosthesis with reimplantation of the coronary arteries, resection and grafting of ascending aortic aneurysm using a 28-mm Hemashield tube graft 2010. She is asymptomatic.  I will follow up in one year. Labs followed by primary care.   2. HTN improved control on current medical regimen.   3. Depression

## 2015-12-11 DIAGNOSIS — H02403 Unspecified ptosis of bilateral eyelids: Secondary | ICD-10-CM | POA: Diagnosis not present

## 2015-12-16 DIAGNOSIS — I10 Essential (primary) hypertension: Secondary | ICD-10-CM | POA: Diagnosis not present

## 2015-12-24 DIAGNOSIS — F3341 Major depressive disorder, recurrent, in partial remission: Secondary | ICD-10-CM | POA: Diagnosis not present

## 2015-12-24 DIAGNOSIS — I10 Essential (primary) hypertension: Secondary | ICD-10-CM | POA: Diagnosis not present

## 2015-12-24 DIAGNOSIS — F411 Generalized anxiety disorder: Secondary | ICD-10-CM | POA: Diagnosis not present

## 2015-12-24 DIAGNOSIS — R350 Frequency of micturition: Secondary | ICD-10-CM | POA: Diagnosis not present

## 2015-12-26 DIAGNOSIS — K5901 Slow transit constipation: Secondary | ICD-10-CM | POA: Diagnosis not present

## 2015-12-26 DIAGNOSIS — Z86010 Personal history of colonic polyps: Secondary | ICD-10-CM | POA: Diagnosis not present

## 2015-12-31 DIAGNOSIS — D485 Neoplasm of uncertain behavior of skin: Secondary | ICD-10-CM | POA: Diagnosis not present

## 2015-12-31 DIAGNOSIS — I10 Essential (primary) hypertension: Secondary | ICD-10-CM | POA: Diagnosis not present

## 2015-12-31 DIAGNOSIS — J069 Acute upper respiratory infection, unspecified: Secondary | ICD-10-CM | POA: Diagnosis not present

## 2015-12-31 DIAGNOSIS — H02403 Unspecified ptosis of bilateral eyelids: Secondary | ICD-10-CM | POA: Diagnosis not present

## 2016-01-17 DIAGNOSIS — H02403 Unspecified ptosis of bilateral eyelids: Secondary | ICD-10-CM | POA: Diagnosis not present

## 2016-01-21 DIAGNOSIS — F330 Major depressive disorder, recurrent, mild: Secondary | ICD-10-CM | POA: Diagnosis not present

## 2016-02-13 DIAGNOSIS — H02401 Unspecified ptosis of right eyelid: Secondary | ICD-10-CM | POA: Diagnosis not present

## 2016-02-13 DIAGNOSIS — H02403 Unspecified ptosis of bilateral eyelids: Secondary | ICD-10-CM | POA: Diagnosis not present

## 2016-02-13 DIAGNOSIS — H02402 Unspecified ptosis of left eyelid: Secondary | ICD-10-CM | POA: Diagnosis not present

## 2016-02-25 DIAGNOSIS — F411 Generalized anxiety disorder: Secondary | ICD-10-CM | POA: Diagnosis not present

## 2016-03-24 DIAGNOSIS — F411 Generalized anxiety disorder: Secondary | ICD-10-CM | POA: Diagnosis not present

## 2016-03-30 DIAGNOSIS — E559 Vitamin D deficiency, unspecified: Secondary | ICD-10-CM | POA: Diagnosis not present

## 2016-03-30 DIAGNOSIS — I10 Essential (primary) hypertension: Secondary | ICD-10-CM | POA: Diagnosis not present

## 2016-03-30 DIAGNOSIS — Z0001 Encounter for general adult medical examination with abnormal findings: Secondary | ICD-10-CM | POA: Diagnosis not present

## 2016-04-02 DIAGNOSIS — Z0000 Encounter for general adult medical examination without abnormal findings: Secondary | ICD-10-CM | POA: Diagnosis not present

## 2016-04-02 DIAGNOSIS — I10 Essential (primary) hypertension: Secondary | ICD-10-CM | POA: Diagnosis not present

## 2016-04-02 DIAGNOSIS — E559 Vitamin D deficiency, unspecified: Secondary | ICD-10-CM | POA: Diagnosis not present

## 2016-04-02 DIAGNOSIS — F329 Major depressive disorder, single episode, unspecified: Secondary | ICD-10-CM | POA: Diagnosis not present

## 2016-04-07 DIAGNOSIS — F411 Generalized anxiety disorder: Secondary | ICD-10-CM | POA: Diagnosis not present

## 2016-04-07 DIAGNOSIS — I10 Essential (primary) hypertension: Secondary | ICD-10-CM | POA: Diagnosis not present

## 2016-04-07 DIAGNOSIS — F3341 Major depressive disorder, recurrent, in partial remission: Secondary | ICD-10-CM | POA: Diagnosis not present

## 2016-05-05 ENCOUNTER — Emergency Department (HOSPITAL_COMMUNITY)
Admission: EM | Admit: 2016-05-05 | Discharge: 2016-05-05 | Disposition: A | Discharge status: Home / Self Care | Payer: Medicare Other | Attending: Emergency Medicine | Admitting: Emergency Medicine

## 2016-05-05 ENCOUNTER — Encounter (HOSPITAL_COMMUNITY): Payer: Self-pay

## 2016-05-05 DIAGNOSIS — R110 Nausea: Secondary | ICD-10-CM

## 2016-05-05 DIAGNOSIS — Z79899 Other long term (current) drug therapy: Secondary | ICD-10-CM | POA: Insufficient documentation

## 2016-05-05 DIAGNOSIS — E7800 Pure hypercholesterolemia, unspecified: Secondary | ICD-10-CM | POA: Diagnosis not present

## 2016-05-05 DIAGNOSIS — Z79891 Long term (current) use of opiate analgesic: Secondary | ICD-10-CM | POA: Insufficient documentation

## 2016-05-05 DIAGNOSIS — I10 Essential (primary) hypertension: Secondary | ICD-10-CM | POA: Insufficient documentation

## 2016-05-05 DIAGNOSIS — R42 Dizziness and giddiness: Secondary | ICD-10-CM | POA: Diagnosis present

## 2016-05-05 DIAGNOSIS — E876 Hypokalemia: Secondary | ICD-10-CM | POA: Insufficient documentation

## 2016-05-05 DIAGNOSIS — Z859 Personal history of malignant neoplasm, unspecified: Secondary | ICD-10-CM | POA: Insufficient documentation

## 2016-05-05 DIAGNOSIS — F329 Major depressive disorder, single episode, unspecified: Secondary | ICD-10-CM | POA: Diagnosis not present

## 2016-05-05 LAB — CBC
HEMATOCRIT: 34.4 % — AB (ref 36.0–46.0)
HEMOGLOBIN: 11.3 g/dL — AB (ref 12.0–15.0)
MCH: 28.3 pg (ref 26.0–34.0)
MCHC: 32.8 g/dL (ref 30.0–36.0)
MCV: 86 fL (ref 78.0–100.0)
Platelets: 223 10*3/uL (ref 150–400)
RBC: 4 MIL/uL (ref 3.87–5.11)
RDW: 13.7 % (ref 11.5–15.5)
WBC: 5.5 10*3/uL (ref 4.0–10.5)

## 2016-05-05 LAB — URINALYSIS, ROUTINE W REFLEX MICROSCOPIC
Bilirubin Urine: NEGATIVE
GLUCOSE, UA: NEGATIVE mg/dL
KETONES UR: NEGATIVE mg/dL
Nitrite: NEGATIVE
PH: 5.5 (ref 5.0–8.0)
Protein, ur: NEGATIVE mg/dL
SPECIFIC GRAVITY, URINE: 1.015 (ref 1.005–1.030)

## 2016-05-05 LAB — HEPATIC FUNCTION PANEL
ALBUMIN: 4.3 g/dL (ref 3.5–5.0)
ALK PHOS: 65 U/L (ref 38–126)
ALT: 15 U/L (ref 14–54)
AST: 25 U/L (ref 15–41)
BILIRUBIN TOTAL: 0.7 mg/dL (ref 0.3–1.2)
Total Protein: 7.4 g/dL (ref 6.5–8.1)

## 2016-05-05 LAB — URINE MICROSCOPIC-ADD ON

## 2016-05-05 LAB — BASIC METABOLIC PANEL
ANION GAP: 9 (ref 5–15)
BUN: 10 mg/dL (ref 6–20)
CHLORIDE: 103 mmol/L (ref 101–111)
CO2: 22 mmol/L (ref 22–32)
Calcium: 9 mg/dL (ref 8.9–10.3)
Creatinine, Ser: 0.74 mg/dL (ref 0.44–1.00)
GFR calc Af Amer: >60 mL/min (ref >60)
GFR calc non Af Amer: >60 mL/min (ref >60)
GLUCOSE: 139 mg/dL — AB (ref 65–99)
POTASSIUM: 3.2 mmol/L — AB (ref 3.5–5.1)
Sodium: 134 mmol/L — ABNORMAL LOW (ref 135–145)

## 2016-05-05 LAB — CBG MONITORING, ED: Glucose-Capillary: 163 mg/dL — ABNORMAL HIGH (ref 65–99)

## 2016-05-05 LAB — I-STAT TROPONIN, ED: TROPONIN I, POC: 0 ng/mL (ref 0.00–0.08)

## 2016-05-05 MED ORDER — POTASSIUM CHLORIDE CRYS ER 20 MEQ PO TBCR
40.0000 meq | EXTENDED_RELEASE_TABLET | Freq: Once | ORAL | Status: AC
  Administered 2016-05-05: 40 meq via ORAL
  Filled 2016-05-05: qty 2

## 2016-05-05 MED ORDER — SODIUM CHLORIDE 0.9 % IV BOLUS (SEPSIS)
1000.0000 mL | Freq: Once | INTRAVENOUS | Status: AC
  Administered 2016-05-05: 1000 mL via INTRAVENOUS

## 2016-05-05 MED ORDER — METOCLOPRAMIDE HCL 10 MG PO TABS: 10.0000 mg | ORAL_TABLET | Freq: Four times a day (QID) | ORAL | 0 refills | Status: DC | PRN

## 2016-05-05 MED ORDER — METOCLOPRAMIDE HCL 5 MG/ML IJ SOLN
10.0000 mg | Freq: Once | INTRAMUSCULAR | Status: AC
  Administered 2016-05-05: 10 mg via INTRAVENOUS
  Filled 2016-05-05: qty 2

## 2016-05-05 NOTE — ED Provider Notes (Signed)
  Face-to-face evaluation   History: She complains of dizziness and nausea for a couple of days. She thought she was dehydrated or have a UTI.  Physical exam: Alert, elderly female. She is taking fluids easily. She is taking potassium pills without problems. No respiratory distress. Moves all extremities equally.  Medical screening examination/treatment/procedure(s) were conducted as a shared visit with non-physician practitioner(s) and myself.  I personally evaluated the patient during the encounter   Daleen Bo, MD 05/08/16 1254

## 2016-05-05 NOTE — ED Provider Notes (Signed)
[facility] DEPT Provider Note   CSN: [id number] Arrival date & time: 05/05/16  1801  First Provider Contact:  First MD Initiated Contact with Patient 05/05/16 1900     History   Chief Complaint Chief Complaint  Patient presents with  . Dizziness  . Nausea   HPI  [name] [name] is an 82 y.o. female with history of paroxysmal afib, HTN, aortic valve replacement who presents to the ED for evaluation of dizziness and nausea. She states she was in her usual state of health until this morning when she woke up feeling nauseated. She states she also felt dizzy for about an hour. She cannot further characterize the dizziness. She states she has continued to feel intermittently nauseated today but denies emesis. Denies abdominal pain or diarrhea. Denies chest pain, fever, chills, shortness of breath, weakness, or numbness. She states she has no dysuria. She has frequent urination but this is baseline.   Past Medical History:  Diagnosis Date  . Acute blood loss anemia    Postoperative  . Anemia   . Aortic aneurysm, thoracic ([place])   . Aortic insufficiency   . Cancer ([place])    colon  . Depression with anxiety   . Hypercholesterolemia   . Hypertension   . Paroxysmal atrial fibrillation (HCC)   . S/P AVR (aortic valve replacement) and aortoplasty 09/30/2012  . Thrombocytopenia ([place])    Postoperative thrombocytopenia    Patient Active Problem List   Diagnosis Date Noted  . Acute blood loss anemia 03/06/2013  . Anxiety state, unspecified 03/06/2013  . Depression 03/06/2013  . Unspecified constipation 03/06/2013  . Fracture tibia/fibula 12/02/2012  . Hypokalemia 12/02/2012  . S/P AVR (aortic valve replacement) and aortoplasty 09/30/2012  . HTN (hypertension) 09/30/2012  . Hypertension   . Hypercholesterolemia   . Paroxysmal atrial fibrillation (HCC)   . Aortic aneurysm, thoracic ([place])   . Aortic insufficiency     Past Surgical History:  Procedure Laterality Date  . AORTIC  VALVE REPLACEMENT  03/20/2009   mild aortic stenosis & mod to severe A1, lt verticular wall thickness was normal & lt ventricular function was estimated to be 55 to 60%,no mitral valve regugitation or stenosis  . COLON SURGERY  1973   for colon cancer  . COLONOSCOPY  12/29/2011   Procedure: COLONOSCOPY;  Surgeon: Cleotis Nipper, MD;  Location: [facility] ENDOSCOPY;  Service: Endoscopy;  Laterality: N/A;  . COLONOSCOPY  08/30/2012   Procedure: COLONOSCOPY;  Surgeon: Cleotis Nipper, MD;  Location: [facility] ENDOSCOPY;  Service: Endoscopy;  Laterality: N/A;  . HOT HEMOSTASIS  12/29/2011   Procedure: HOT HEMOSTASIS (ARGON PLASMA COAGULATION/BICAP);  Surgeon: Cleotis Nipper, MD;  Location: [facility] ENDOSCOPY;  Service: Endoscopy;  Laterality: N/A;  . HOT HEMOSTASIS  08/30/2012   Procedure: HOT HEMOSTASIS (ARGON PLASMA COAGULATION/BICAP);  Surgeon: Cleotis Nipper, MD;  Location: [facility] ENDOSCOPY;  Service: Endoscopy;  Laterality: N/A;  . ROTATOR CUFF REPAIR     Right Shoulder  . TIBIA IM NAIL INSERTION Left 12/03/2012   Procedure: INTRAMEDULLARY (IM) NAIL TIBIAL;  Surgeon: Mauri Pole, MD;  Location: [facility] ORS;  Service: Orthopedics;  Laterality: Left;    OB History    No data available       Home Medications    Prior to Admission medications   Medication Sig Start Date End Date Taking? Authorizing Provider  ALPRAZolam Duanne Moron) 0.5 MG tablet Take 0.5 mg by mouth 3 (three) times daily as needed for anxiety.     Historical  Provider, MD  amLODipine (NORVASC) 5 MG tablet Take 1 tablet by mouth daily. 11/12/15   Historical Provider, MD  Ascorbic Acid (VITAMIN C) 100 MG tablet Take 100 mg by mouth daily.    Historical Provider, MD  cholecalciferol (VITAMIN D) 1000 UNITS tablet Take 1,000 Units by mouth daily.    Historical Provider, MD  irbesartan (AVAPRO) 300 MG tablet Take 1 tablet by mouth daily. 11/12/15   Historical Provider, MD  traMADol (ULTRAM) 50 MG tablet Take 1-2 tablets (50-100 mg total) by mouth every 6  (six) hours as needed for pain. 12/05/12   Paralee Cancel, MD    Family History Family History  Problem Relation Age of Onset  . Parkinsonism Mother   . Hypertension Father     Social History Social History  Substance Use Topics  . Smoking status: Never Smoker  . Smokeless tobacco: Never Used  . Alcohol use No     Allergies   Codeine   Review of Systems Review of Systems 10 Systems reviewed and are negative for acute change except as noted in the HPI.   Physical Exam Updated Vital Signs BP 168/69 (BP Location: Left Arm)   Pulse 81   Temp 98.5 F (36.9 C)   Resp 20   SpO2 97%   Physical Exam  Constitutional: She is oriented to person, place, and time.  HENT:  Right Ear: External ear normal.  Left Ear: External ear normal.  Nose: Nose normal.  Mouth/Throat: No oropharyngeal exudate.  MM dry  Eyes: Conjunctivae and EOM are normal. Pupils are equal, round, and reactive to light.  Neck: Normal range of motion. Neck supple.  Cardiovascular: Normal rate, regular rhythm, normal heart sounds and intact distal pulses.   Pulmonary/Chest: Effort normal and breath sounds normal. No respiratory distress. She has no wheezes. She has no rales.  Abdominal: Soft. Bowel sounds are normal. She exhibits no distension. There is no tenderness. There is no rebound and no guarding.  Musculoskeletal: She exhibits no edema.  Neurological: She is alert and oriented to person, place, and time. No cranial nerve deficit.  Normal finger to nose No pronator drift Moves all extremities well  Skin: Skin is warm and dry.  Psychiatric: She has a normal mood and affect.  Nursing note and vitals reviewed.    ED Treatments / Results  Labs (all labs ordered are listed, but only abnormal results are displayed) Labs Reviewed  BASIC METABOLIC PANEL - Abnormal; Notable for the following:       Result Value   Sodium 134 (*)    Potassium 3.2 (*)    Glucose, Bld 139 (*)    All other components  within normal limits  CBC - Abnormal; Notable for the following:    Hemoglobin 11.3 (*)    HCT 34.4 (*)    All other components within normal limits  URINALYSIS, ROUTINE W REFLEX MICROSCOPIC (NOT AT [facility]) - Abnormal; Notable for the following:    APPearance CLOUDY (*)    Hgb urine dipstick MODERATE (*)    Leukocytes, UA SMALL (*)    All other components within normal limits  URINE MICROSCOPIC-ADD ON - Abnormal; Notable for the following:    Squamous Epithelial / LPF 6-30 (*)    Bacteria, UA FEW (*)    All other components within normal limits  HEPATIC FUNCTION PANEL - Abnormal; Notable for the following:    Bilirubin, Direct <0.1 (*)    All other components within normal limits  CBG MONITORING,  ED - Abnormal; Notable for the following:    Glucose-Capillary 163 (*)    All other components within normal limits  URINE CULTURE  I-STAT TROPOININ, ED    EKG  EKG Interpretation  Date/Time:  Tuesday May 05 2016 18:21:19 EDT Ventricular Rate:  76 PR Interval:    QRS Duration: 78 QT Interval:  412 QTC Calculation: 464 R Axis:   85 Text Interpretation:  Sinus rhythm Borderline right axis deviation Borderline T abnormalities, diffuse leads since last tracing no significant change Confirmed by Eulis Foster  MD, ELLIOTT [id number]) on 05/05/2016 6:44:53 PM       Radiology No results found.  Procedures Procedures (including critical care time)  Medications Ordered in ED Medications  sodium chloride 0.9 % bolus 1,000 mL (1,000 mLs Intravenous New Bag/Given 05/05/16 1932)  potassium chloride SA (K-DUR,KLOR-CON) CR tablet 40 mEq (not administered)  metoCLOPramide (REGLAN) injection 10 mg (10 mg Intravenous Given 05/05/16 1933)     Initial Impression / Assessment and Plan / ED Course  I have reviewed the triage vital signs and the nursing notes.  Pertinent labs & imaging results that were available during my care of the patient were reviewed by me and considered in my medical decision making  (see chart for details).  Labs with a mild hypokalemia which we corrected here. Fluids and reglan given with improvement in pt symptoms. Exam and lab findings otherwise benign and reassuring. Will d/c home with rx for reglan. Instructed close f/u with PCP. ER return precautions given.  Final Clinical Impressions(s) / ED Diagnoses   Final diagnoses:  Nausea  Hypokalemia    New Prescriptions New Prescriptions   METOCLOPRAMIDE (REGLAN) 10 MG TABLET    Take 1 tablet (10 mg total) by mouth every 6 (six) hours as needed for nausea.     Anne Ng, PA-C 05/05/16 2028    Daleen Bo, MD 05/08/16 [phone]

## 2016-05-05 NOTE — ED Triage Notes (Signed)
Pt here with dizziness and nausea since last night.  Denies fever.  No burning with urination.  No cough.  No vomiting.  Pt denies chest pain.  Decreased appetite with the nausea.

## 2016-05-05 NOTE — Discharge Instructions (Signed)
Your labs today were reassuring. Your potassium was a little bit low. We gave you some supplements here. Please follow up with your primary care provider as soon as possible. Please have him re-check your metabolic panel. I will also give you a prescription for Reglan to take as needed for nausea. Return to the ER for new or worsening symptoms.

## 2016-05-06 DIAGNOSIS — R110 Nausea: Secondary | ICD-10-CM | POA: Diagnosis not present

## 2016-05-06 DIAGNOSIS — E876 Hypokalemia: Secondary | ICD-10-CM | POA: Diagnosis not present

## 2016-05-06 DIAGNOSIS — F411 Generalized anxiety disorder: Secondary | ICD-10-CM | POA: Diagnosis not present

## 2016-05-07 LAB — URINE CULTURE

## 2016-05-11 DIAGNOSIS — R110 Nausea: Secondary | ICD-10-CM | POA: Diagnosis not present

## 2016-05-11 DIAGNOSIS — R6883 Chills (without fever): Secondary | ICD-10-CM | POA: Diagnosis not present

## 2016-05-11 DIAGNOSIS — E876 Hypokalemia: Secondary | ICD-10-CM | POA: Diagnosis not present

## 2016-05-11 DIAGNOSIS — R350 Frequency of micturition: Secondary | ICD-10-CM | POA: Diagnosis not present

## 2016-05-14 DIAGNOSIS — F411 Generalized anxiety disorder: Secondary | ICD-10-CM | POA: Diagnosis not present

## 2016-05-20 DIAGNOSIS — F411 Generalized anxiety disorder: Secondary | ICD-10-CM | POA: Diagnosis not present

## 2016-05-20 DIAGNOSIS — M545 Low back pain: Secondary | ICD-10-CM | POA: Diagnosis not present

## 2016-05-27 DIAGNOSIS — R3915 Urgency of urination: Secondary | ICD-10-CM | POA: Diagnosis not present

## 2016-05-27 DIAGNOSIS — N898 Other specified noninflammatory disorders of vagina: Secondary | ICD-10-CM | POA: Diagnosis not present

## 2016-06-24 DIAGNOSIS — Z23 Encounter for immunization: Secondary | ICD-10-CM | POA: Diagnosis not present

## 2016-06-24 DIAGNOSIS — F411 Generalized anxiety disorder: Secondary | ICD-10-CM | POA: Diagnosis not present

## 2016-06-24 DIAGNOSIS — N898 Other specified noninflammatory disorders of vagina: Secondary | ICD-10-CM | POA: Diagnosis not present

## 2016-07-02 DIAGNOSIS — M545 Low back pain: Secondary | ICD-10-CM | POA: Diagnosis not present

## 2016-07-02 DIAGNOSIS — M5136 Other intervertebral disc degeneration, lumbar region: Secondary | ICD-10-CM | POA: Diagnosis not present

## 2016-07-24 DIAGNOSIS — F411 Generalized anxiety disorder: Secondary | ICD-10-CM | POA: Diagnosis not present

## 2016-07-28 DIAGNOSIS — M5136 Other intervertebral disc degeneration, lumbar region: Secondary | ICD-10-CM | POA: Diagnosis not present

## 2016-07-31 DIAGNOSIS — F411 Generalized anxiety disorder: Secondary | ICD-10-CM | POA: Diagnosis not present

## 2016-08-12 DIAGNOSIS — F411 Generalized anxiety disorder: Secondary | ICD-10-CM | POA: Diagnosis not present

## 2016-09-01 DIAGNOSIS — L292 Pruritus vulvae: Secondary | ICD-10-CM | POA: Diagnosis not present

## 2016-09-01 DIAGNOSIS — L940 Localized scleroderma [morphea]: Secondary | ICD-10-CM | POA: Diagnosis not present

## 2016-09-01 DIAGNOSIS — Z124 Encounter for screening for malignant neoplasm of cervix: Secondary | ICD-10-CM | POA: Diagnosis not present

## 2016-09-17 DIAGNOSIS — Z86010 Personal history of colonic polyps: Secondary | ICD-10-CM | POA: Diagnosis not present

## 2016-09-17 DIAGNOSIS — K5901 Slow transit constipation: Secondary | ICD-10-CM | POA: Diagnosis not present

## 2016-10-06 DIAGNOSIS — L900 Lichen sclerosus et atrophicus: Secondary | ICD-10-CM | POA: Diagnosis not present

## 2016-10-08 DIAGNOSIS — F411 Generalized anxiety disorder: Secondary | ICD-10-CM | POA: Diagnosis not present

## 2016-10-13 DIAGNOSIS — F411 Generalized anxiety disorder: Secondary | ICD-10-CM | POA: Diagnosis not present

## 2016-10-27 DIAGNOSIS — I10 Essential (primary) hypertension: Secondary | ICD-10-CM | POA: Diagnosis not present

## 2016-10-27 DIAGNOSIS — F329 Major depressive disorder, single episode, unspecified: Secondary | ICD-10-CM | POA: Diagnosis not present

## 2016-11-23 DIAGNOSIS — H52223 Regular astigmatism, bilateral: Secondary | ICD-10-CM | POA: Diagnosis not present

## 2016-11-26 DIAGNOSIS — L298 Other pruritus: Secondary | ICD-10-CM | POA: Diagnosis not present

## 2016-11-26 DIAGNOSIS — F411 Generalized anxiety disorder: Secondary | ICD-10-CM | POA: Diagnosis not present

## 2016-11-29 NOTE — Progress Notes (Signed)
[name] [name] Date of Birth: 1929/10/21 Medical Record [id number]  History of Present Illness: [name] [name] is seen back today for follow up AV disease. She had bioprosthetic aortic valve replacement with aortic root grafting in June of 2010 by Dr. Cyndia Bent for a large thoracic aneurysm and AI. Other issues include HTN,  anxiety/depression. No known CAD per prior coronary CT - she was never cathed prior to her surgery in 2010.   On follow up today she denies any cardiac complaints. No SOB, chest pain, or palpitations. Echo in October 2014 was unremarkable. She does note some issues with depression and "nerves". Appetite is poor and she has lost 8 lbs. No other medical issues. She is fairly sedentary.  Current Outpatient Prescriptions  Medication Sig Dispense Refill  . acetaminophen (TYLENOL) 500 MG tablet Take 500 mg by mouth every 6 (six) hours as needed for mild pain, moderate pain, fever or headache.    Marland Kitchen amLODipine (NORVASC) 5 MG tablet Take 1 tablet by mouth daily.  0  . escitalopram (LEXAPRO) 10 MG tablet Take 10 mg by mouth daily.  1  . irbesartan (AVAPRO) 300 MG tablet Take 1 tablet by mouth daily.  0  . LORazepam (ATIVAN) 0.5 MG tablet Take 0.5 mg by mouth every 8 (eight) hours.    . meclizine (ANTIVERT) 25 MG tablet Take 12.5-25 mg by mouth 2 (two) times daily as needed for nausea.    . metoCLOPramide (REGLAN) 10 MG tablet Take 1 tablet (10 mg total) by mouth every 6 (six) hours as needed for nausea. 30 tablet 0  . traMADol (ULTRAM) 50 MG tablet   0   No current facility-administered medications for this visit.     Allergies  Allergen Reactions  . Codeine Nausea And Vomiting    Past Medical History:  Diagnosis Date  . Acute blood loss anemia    Postoperative  . Anemia   . Aortic aneurysm, thoracic ([place])   . Aortic insufficiency   . Cancer ([place])    colon  . Depression with anxiety   . Hypercholesterolemia   . Hypertension   . Paroxysmal atrial fibrillation  (H[facility])   . S/P AVR (aortic valve replacement) and aortoplasty 09/30/2012  . Thrombocytopenia ([place])    Postoperative thrombocytopenia    Past Surgical History:  Procedure Laterality Date  . AORTIC VALVE REPLACEMENT  03/20/2009   mild aortic stenosis & mod to severe A1, lt verticular wall thickness was normal & lt ventricular function was estimated to be 55 to 60%,no mitral valve regugitation or stenosis  . COLON SURGERY  1973   for colon cancer  . COLONOSCOPY  12/29/2011   Procedure: COLONOSCOPY;  Surgeon: Cleotis Nipper, MD;  Location: [facility] ENDOSCOPY;  Service: Endoscopy;  Laterality: N/A;  . COLONOSCOPY  08/30/2012   Procedure: COLONOSCOPY;  Surgeon: Cleotis Nipper, MD;  Location: [facility] ENDOSCOPY;  Service: Endoscopy;  Laterality: N/A;  . HOT HEMOSTASIS  12/29/2011   Procedure: HOT HEMOSTASIS (ARGON PLASMA COAGULATION/BICAP);  Surgeon: Cleotis Nipper, MD;  Location: [facility] ENDOSCOPY;  Service: Endoscopy;  Laterality: N/A;  . HOT HEMOSTASIS  08/30/2012   Procedure: HOT HEMOSTASIS (ARGON PLASMA COAGULATION/BICAP);  Surgeon: Cleotis Nipper, MD;  Location: [facility] ENDOSCOPY;  Service: Endoscopy;  Laterality: N/A;  . ROTATOR CUFF REPAIR     Right Shoulder  . TIBIA IM NAIL INSERTION Left 12/03/2012   Procedure: INTRAMEDULLARY (IM) NAIL TIBIAL;  Surgeon: Mauri Pole, MD;  Location: [facility] ORS;  Service: Orthopedics;  Laterality: Left;    History  Smoking Status  . Never Smoker  Smokeless Tobacco  . Never Used    History  Alcohol Use No    Family History  Problem Relation Age of Onset  . Parkinsonism Mother   . Hypertension Father     Review of Systems: The review of systems is per the HPI.  All other systems were reviewed and are negative.  Physical Exam: BP 130/68   Pulse 70   Ht 5\' 3"  (1.6 m)   Wt 125 lb 12.8 oz (57.1 kg)   BMI 22.28 kg/m  Patient is very pleasant and in no acute distress. Skin is warm and dry. Color is normal.  HEENT is unremarkable. Normocephalic/atraumatic.  PERRL. Sclera are nonicteric. Neck is supple. No masses. No JVD. Lungs are clear. Cardiac exam shows a regular rate and rhythm. Aortic valve sounds ok. No murmur or gallop. Abdomen is soft. Extremities are without edema. Gait and ROM are intact. No gross neurologic deficits noted.  LABORATORY DATA:    Lab Results  Component Value Date   WBC 5.5 05/05/2016   HGB 11.3 (L) 05/05/2016   HCT 34.4 (L) 05/05/2016   PLT 223 05/05/2016   GLUCOSE 139 (H) 05/05/2016   CHOL 196 07/18/2013   TRIG 78.0 07/18/2013   HDL 62.90 07/18/2013   LDLCALC 118 (H) 07/18/2013   ALT 15 05/05/2016   AST 25 05/05/2016   NA 134 (L) 05/05/2016   K 3.2 (L) 05/05/2016   CL 103 05/05/2016   CREATININE 0.74 05/05/2016   BUN 10 05/05/2016   CO2 22 05/05/2016   TSH 1.71 07/18/2013   INR 1.7 (H) 03/20/2009   HGBA1C  03/18/2009    5.8 (NOTE) The ADA recommends the following therapeutic goal for glycemic control related to Hgb A1c measurement: Goal of therapy: <6.5 Hgb A1c  Reference: American Diabetes Association: Clinical Practice Recommendations 2010, Diabetes Care, 2010, 33: (Suppl  1).   Labs dated 03/30/16: cholesterol 178, triglyderides 73, HDL 66, LDL 97.  Echo Study Conclusions from October 2014  Study Conclusions  - Left ventricle: The cavity size was normal. Wall thickness was increased in a pattern of mild LVH. Systolic function was normal. The estimated ejection fraction was in the range of 60% to 65%. Doppler parameters are consistent with abnormal left ventricular relaxation (grade 1 diastolic dysfunction). - Aortic valve: Aortic valve prosthesis opens well. - Pulmonary arteries: PA peak pressure: 58mm Hg (S).   Assessment / Plan: 1.S/p aortic valve and aortic root using a 21-mm Medtronic Freestyle aortic root heart valve prosthesis with reimplantation of the coronary arteries, resection and grafting of ascending aortic aneurysm using a 28-mm Hemashield tube graft 2010. She is asymptomatic. Exam  is unremarkbable.  I will follow up in one year. Encourage increased activity.  2. HTN improved control on current medical regimen.   3. Depression

## 2016-12-01 ENCOUNTER — Encounter: Payer: Self-pay | Admitting: Cardiology

## 2016-12-01 ENCOUNTER — Ambulatory Visit (INDEPENDENT_AMBULATORY_CARE_PROVIDER_SITE_OTHER): Payer: Medicare Other | Admitting: Cardiology

## 2016-12-01 VITALS — BP 130/68 | HR 70 | Ht 63.0 in | Wt 125.8 lb

## 2016-12-01 DIAGNOSIS — E7800 Pure hypercholesterolemia, unspecified: Secondary | ICD-10-CM | POA: Diagnosis not present

## 2016-12-01 DIAGNOSIS — Z952 Presence of prosthetic heart valve: Secondary | ICD-10-CM

## 2016-12-01 DIAGNOSIS — I712 Thoracic aortic aneurysm, without rupture, unspecified: Secondary | ICD-10-CM

## 2016-12-01 DIAGNOSIS — I10 Essential (primary) hypertension: Secondary | ICD-10-CM | POA: Diagnosis not present

## 2016-12-01 NOTE — Patient Instructions (Addendum)
Continue your current therapy  Increase your activity  I will see you in one year. 

## 2016-12-04 DIAGNOSIS — L298 Other pruritus: Secondary | ICD-10-CM | POA: Diagnosis not present

## 2016-12-04 DIAGNOSIS — Z862 Personal history of diseases of the blood and blood-forming organs and certain disorders involving the immune mechanism: Secondary | ICD-10-CM | POA: Diagnosis not present

## 2016-12-04 DIAGNOSIS — E559 Vitamin D deficiency, unspecified: Secondary | ICD-10-CM | POA: Diagnosis not present

## 2016-12-04 DIAGNOSIS — D649 Anemia, unspecified: Secondary | ICD-10-CM | POA: Diagnosis not present

## 2016-12-04 DIAGNOSIS — I10 Essential (primary) hypertension: Secondary | ICD-10-CM | POA: Diagnosis not present

## 2016-12-22 DIAGNOSIS — E559 Vitamin D deficiency, unspecified: Secondary | ICD-10-CM | POA: Diagnosis not present

## 2016-12-22 DIAGNOSIS — I10 Essential (primary) hypertension: Secondary | ICD-10-CM | POA: Diagnosis not present

## 2016-12-22 DIAGNOSIS — D649 Anemia, unspecified: Secondary | ICD-10-CM | POA: Diagnosis not present

## 2016-12-22 DIAGNOSIS — F419 Anxiety disorder, unspecified: Secondary | ICD-10-CM | POA: Diagnosis not present

## 2016-12-25 DIAGNOSIS — L900 Lichen sclerosus et atrophicus: Secondary | ICD-10-CM | POA: Diagnosis not present

## 2016-12-25 DIAGNOSIS — B373 Candidiasis of vulva and vagina: Secondary | ICD-10-CM | POA: Diagnosis not present

## 2017-01-18 DIAGNOSIS — F419 Anxiety disorder, unspecified: Secondary | ICD-10-CM | POA: Diagnosis not present

## 2017-01-19 DIAGNOSIS — K5901 Slow transit constipation: Secondary | ICD-10-CM | POA: Diagnosis not present

## 2017-01-19 DIAGNOSIS — Z86010 Personal history of colonic polyps: Secondary | ICD-10-CM | POA: Diagnosis not present

## 2017-02-02 DIAGNOSIS — F419 Anxiety disorder, unspecified: Secondary | ICD-10-CM | POA: Diagnosis not present

## 2017-02-04 DIAGNOSIS — F419 Anxiety disorder, unspecified: Secondary | ICD-10-CM | POA: Diagnosis not present

## 2017-02-20 DIAGNOSIS — M1712 Unilateral primary osteoarthritis, left knee: Secondary | ICD-10-CM | POA: Diagnosis not present

## 2017-02-20 DIAGNOSIS — M25562 Pain in left knee: Secondary | ICD-10-CM | POA: Diagnosis not present

## 2017-02-20 DIAGNOSIS — G8929 Other chronic pain: Secondary | ICD-10-CM | POA: Diagnosis not present

## 2017-02-20 DIAGNOSIS — M5136 Other intervertebral disc degeneration, lumbar region: Secondary | ICD-10-CM | POA: Diagnosis not present

## 2017-04-23 DIAGNOSIS — K5901 Slow transit constipation: Secondary | ICD-10-CM | POA: Diagnosis not present

## 2017-05-21 DIAGNOSIS — K5901 Slow transit constipation: Secondary | ICD-10-CM | POA: Diagnosis not present

## 2017-05-31 ENCOUNTER — Other Ambulatory Visit: Payer: Self-pay | Admitting: Physician Assistant

## 2017-05-31 DIAGNOSIS — R194 Change in bowel habit: Secondary | ICD-10-CM

## 2017-05-31 DIAGNOSIS — Z85038 Personal history of other malignant neoplasm of large intestine: Secondary | ICD-10-CM

## 2017-06-03 DIAGNOSIS — Z0000 Encounter for general adult medical examination without abnormal findings: Secondary | ICD-10-CM | POA: Diagnosis not present

## 2017-06-03 DIAGNOSIS — M810 Age-related osteoporosis without current pathological fracture: Secondary | ICD-10-CM | POA: Diagnosis not present

## 2017-06-10 ENCOUNTER — Other Ambulatory Visit: Payer: Medicare Other

## 2017-06-10 DIAGNOSIS — I10 Essential (primary) hypertension: Secondary | ICD-10-CM | POA: Diagnosis not present

## 2017-06-15 ENCOUNTER — Ambulatory Visit
Admission: RE | Admit: 2017-06-15 | Discharge: 2017-06-15 | Disposition: A | Discharge status: Home / Self Care | Payer: Medicare Other | Source: Ambulatory Visit | Attending: Physician Assistant | Admitting: Physician Assistant

## 2017-06-15 DIAGNOSIS — K5732 Diverticulitis of large intestine without perforation or abscess without bleeding: Secondary | ICD-10-CM | POA: Diagnosis not present

## 2017-06-15 DIAGNOSIS — R194 Change in bowel habit: Secondary | ICD-10-CM

## 2017-06-15 DIAGNOSIS — Z85038 Personal history of other malignant neoplasm of large intestine: Secondary | ICD-10-CM

## 2017-06-16 DIAGNOSIS — F339 Major depressive disorder, recurrent, unspecified: Secondary | ICD-10-CM | POA: Diagnosis not present

## 2017-06-16 DIAGNOSIS — Z0000 Encounter for general adult medical examination without abnormal findings: Secondary | ICD-10-CM | POA: Diagnosis not present

## 2017-06-16 DIAGNOSIS — H02403 Unspecified ptosis of bilateral eyelids: Secondary | ICD-10-CM | POA: Diagnosis not present

## 2017-06-16 DIAGNOSIS — D3002 Benign neoplasm of left kidney: Secondary | ICD-10-CM | POA: Diagnosis not present

## 2017-06-18 DIAGNOSIS — Z86010 Personal history of colonic polyps: Secondary | ICD-10-CM | POA: Diagnosis not present

## 2017-06-18 DIAGNOSIS — K5901 Slow transit constipation: Secondary | ICD-10-CM | POA: Diagnosis not present

## 2017-06-18 DIAGNOSIS — Z85038 Personal history of other malignant neoplasm of large intestine: Secondary | ICD-10-CM | POA: Diagnosis not present

## 2017-06-18 DIAGNOSIS — R933 Abnormal findings on diagnostic imaging of other parts of digestive tract: Secondary | ICD-10-CM | POA: Diagnosis not present

## 2017-07-02 DIAGNOSIS — R933 Abnormal findings on diagnostic imaging of other parts of digestive tract: Secondary | ICD-10-CM | POA: Diagnosis not present

## 2017-07-02 DIAGNOSIS — D126 Benign neoplasm of colon, unspecified: Secondary | ICD-10-CM | POA: Diagnosis not present

## 2017-07-08 DIAGNOSIS — D126 Benign neoplasm of colon, unspecified: Secondary | ICD-10-CM | POA: Diagnosis not present

## 2017-07-26 ENCOUNTER — Emergency Department (HOSPITAL_COMMUNITY)
Admission: EM | Admit: 2017-07-26 | Discharge: 2017-07-26 | Disposition: A | Discharge status: Other Acute Inpatient Hospital | Payer: Medicare Other

## 2017-07-27 ENCOUNTER — Telehealth: Payer: Self-pay | Admitting: Cardiology

## 2017-07-27 DIAGNOSIS — D1800 Hemangioma unspecified site: Secondary | ICD-10-CM | POA: Diagnosis not present

## 2017-07-27 DIAGNOSIS — I4891 Unspecified atrial fibrillation: Secondary | ICD-10-CM | POA: Diagnosis not present

## 2017-07-27 DIAGNOSIS — F411 Generalized anxiety disorder: Secondary | ICD-10-CM | POA: Diagnosis not present

## 2017-07-27 DIAGNOSIS — M79602 Pain in left arm: Secondary | ICD-10-CM | POA: Diagnosis not present

## 2017-07-27 NOTE — Telephone Encounter (Signed)
Received records from [facility] for appointment on 07/30/17 with Dr Martinique.  Records put with Dr Doug Sou schedule for 07/30/17. lp

## 2017-07-28 NOTE — Progress Notes (Signed)
[name] [name] Date of Birth: 1930-09-19 Medical Record #[id number]  History of Present Illness: [name] [name] is seen for evaluation of an abnormal Ecg- ? new onset atrial fibrillation.  She had bioprosthetic aortic valve replacement with aortic root grafting in June of 2010 by Dr. Cyndia Bent for a large thoracic aneurysm and AI. Other issues include HTN,  anxiety/depression. No known CAD per prior coronary CT - she was never cathed prior to her surgery in 2010.   She was seen recently by Dr. Shelia Media October 16/18. Ecg done and interpreted as Afib.  She denied any SOB, chest pain, or palpitations. She did have a recent complaint of pain in left shoulder that lasted a couuple of hours then resolved. Echo in October 2014 was unremarkable. She does note she has bad "nerves".  She is sedentary. No prior history of CVA, TIA, or bleeding.   Current Outpatient Prescriptions  Medication Sig Dispense Refill  . acetaminophen (TYLENOL) 500 MG tablet Take 500 mg by mouth every 6 (six) hours as needed for mild pain, moderate pain, fever or headache.    Marland Kitchen amLODipine (NORVASC) 5 MG tablet Take 1 tablet by mouth daily.  0  . DiazePAM (VALIUM PO) Take 5 mg by mouth daily.    . irbesartan (AVAPRO) 300 MG tablet Take 1 tablet by mouth daily.  0  . meclizine (ANTIVERT) 25 MG tablet Take 12.5-25 mg by mouth 2 (two) times daily as needed for nausea.    . metoCLOPramide (REGLAN) 10 MG tablet Take 1 tablet (10 mg total) by mouth every 6 (six) hours as needed for nausea. 30 tablet 0  . traMADol (ULTRAM) 50 MG tablet   0   No current facility-administered medications for this visit.     Allergies  Allergen Reactions  . Codeine Nausea And Vomiting    Past Medical History:  Diagnosis Date  . Acute blood loss anemia    Postoperative  . Anemia   . Aortic aneurysm, thoracic ([place])   . Aortic insufficiency   . Cancer ([place])    colon  . Depression with anxiety   . Hypercholesterolemia   . Hypertension   .  Paroxysmal atrial fibrillation (HCC)   . S/P AVR (aortic valve replacement) and aortoplasty 09/30/2012  . Thrombocytopenia ([place])    Postoperative thrombocytopenia    Past Surgical History:  Procedure Laterality Date  . AORTIC VALVE REPLACEMENT  03/20/2009   mild aortic stenosis & mod to severe A1, lt verticular wall thickness was normal & lt ventricular function was estimated to be 55 to 60%,no mitral valve regugitation or stenosis  . COLON SURGERY  1973   for colon cancer  . COLONOSCOPY  12/29/2011   Procedure: COLONOSCOPY;  Surgeon: Cleotis Nipper, MD;  Location: [facility] ENDOSCOPY;  Service: Endoscopy;  Laterality: N/A;  . COLONOSCOPY  08/30/2012   Procedure: COLONOSCOPY;  Surgeon: Cleotis Nipper, MD;  Location: [facility] ENDOSCOPY;  Service: Endoscopy;  Laterality: N/A;  . HOT HEMOSTASIS  12/29/2011   Procedure: HOT HEMOSTASIS (ARGON PLASMA COAGULATION/BICAP);  Surgeon: Cleotis Nipper, MD;  Location: [name] ENDOSCOPY;  Service: Endoscopy;  Laterality: N/A;  . HOT HEMOSTASIS  08/30/2012   Procedure: HOT HEMOSTASIS (ARGON PLASMA COAGULATION/BICAP);  Surgeon: Cleotis Nipper, MD;  Location: [facility] ENDOSCOPY;  Service: Endoscopy;  Laterality: N/A;  . ROTATOR CUFF REPAIR     Right Shoulder  . TIBIA IM NAIL INSERTION Left 12/03/2012   Procedure: INTRAMEDULLARY (IM) NAIL TIBIAL;  Surgeon: Mauri Pole, MD;  Location: [facility] ORS;  Service: Orthopedics;  Laterality: Left;    History  Smoking Status  . Never Smoker  Smokeless Tobacco  . Never Used    History  Alcohol Use No    Family History  Problem Relation Age of Onset  . Parkinsonism Mother   . Hypertension Father     Review of Systems: The review of systems is per the HPI.  All other systems were reviewed and are negative.  Physical Exam: BP (!) 144/70   Pulse 86   Ht 5\' 3"  (1.6 m)   Wt 128 lb 9.6 oz (58.3 kg)   LMP  (LMP Unknown)   BMI 22.78 kg/m  GENERAL:  Well appearing, elderly WF in NAD HEENT:  PERRL, EOMI, sclera are clear.  Oropharynx is clear. NECK:  No jugular venous distention, carotid upstroke brisk and symmetric, no bruits, no thyromegaly or adenopathy LUNGS:  Clear to auscultation bilaterally CHEST:  Unremarkable HEART:  RRR with occ extrasystole,  PMI not displaced or sustained,S1 and S2 within normal limits, no S3, no S4: no clicks, no rubs, soft 1/6 systolic murmur LSB ABD:  Soft, nontender. BS +, no masses or bruits. No hepatomegaly, no splenomegaly EXT:  2 + pulses throughout, no edema, no cyanosis no clubbing SKIN:  Warm and dry.  No rashes NEURO:  Alert and oriented x 3. Cranial nerves II through XII intact. PSYCH:  Cognitively intact    LABORATORY DATA:    Lab Results  Component Value Date   WBC 5.5 05/05/2016   HGB 11.3 (L) 05/05/2016   HCT 34.4 (L) 05/05/2016   PLT 223 05/05/2016   GLUCOSE 139 (H) 05/05/2016   CHOL 196 07/18/2013   TRIG 78.0 07/18/2013   HDL 62.90 07/18/2013   LDLCALC 118 (H) 07/18/2013   ALT 15 05/05/2016   AST 25 05/05/2016   NA 134 (L) 05/05/2016   K 3.2 (L) 05/05/2016   CL 103 05/05/2016   CREATININE 0.74 05/05/2016   BUN 10 05/05/2016   CO2 22 05/05/2016   TSH 1.71 07/18/2013   INR 1.7 (H) 03/20/2009   HGBA1C  03/18/2009    5.8 (NOTE) The ADA recommends the following therapeutic goal for glycemic control related to Hgb A1c measurement: Goal of therapy: <6.5 Hgb A1c  Reference: American Diabetes Association: Clinical Practice Recommendations 2010, Diabetes Care, 2010, 33: (Suppl  1).   Labs dated 03/30/16: cholesterol 178, triglyderides 73, HDL 66, LDL 97.  Dated 06/10/17: cholesterol 193, triglycerides 49. HDL 77, LDL 106. Dated 07/27/17: normal CBC and CMET.   Ecg reviewed from 07/27/17: I interpret this to show NSR with PACs. Rate 73. Nonspecific ST abnormality.   Ecg today shows NSR rate 75. Nonspecific TWA. I have personally reviewed and interpreted this study.   Assessment / Plan: 1.S/p aortic valve and aortic root using a 21-mm Medtronic  Freestyle aortic root heart valve prosthesis with reimplantation of the coronary arteries, resection and grafting of ascending aortic aneurysm using a 28-mm Hemashield tube graft 2010. She did have recent left shoulder pain but otherwise is asymptomatic.   Exam is unremarkbable.  We will update Echo since last done in 2014.   2. HTN under fair control on current medical regimen.   3.  ? Afib. I have reviewed Ecgs from 10/16 and today. I think these both show NSR. P wave are quite small but present. No further treatment warranted.

## 2017-07-30 ENCOUNTER — Encounter: Payer: Self-pay | Admitting: Cardiology

## 2017-07-30 ENCOUNTER — Ambulatory Visit (INDEPENDENT_AMBULATORY_CARE_PROVIDER_SITE_OTHER): Payer: Medicare Other | Admitting: Cardiology

## 2017-07-30 VITALS — BP 144/70 | HR 86 | Ht 63.0 in | Wt 128.6 lb

## 2017-07-30 DIAGNOSIS — E7800 Pure hypercholesterolemia, unspecified: Secondary | ICD-10-CM | POA: Diagnosis not present

## 2017-07-30 DIAGNOSIS — I10 Essential (primary) hypertension: Secondary | ICD-10-CM | POA: Diagnosis not present

## 2017-07-30 DIAGNOSIS — I712 Thoracic aortic aneurysm, without rupture, unspecified: Secondary | ICD-10-CM

## 2017-07-30 DIAGNOSIS — Z952 Presence of prosthetic heart valve: Secondary | ICD-10-CM

## 2017-07-30 DIAGNOSIS — I491 Atrial premature depolarization: Secondary | ICD-10-CM

## 2017-07-30 NOTE — Patient Instructions (Signed)
We will schedule you for an Echocardiogram  I will tentatively schedule you for an office visit in 6 months.  We will get a copy of your Ecg from Dr. Shelia Media to review.

## 2017-08-02 ENCOUNTER — Encounter: Payer: Self-pay | Admitting: Cardiology

## 2017-08-12 ENCOUNTER — Ambulatory Visit (INDEPENDENT_AMBULATORY_CARE_PROVIDER_SITE_OTHER): Payer: Medicare Other | Admitting: Emergency Medicine

## 2017-08-12 ENCOUNTER — Other Ambulatory Visit (HOSPITAL_COMMUNITY): Payer: Medicare Other

## 2017-08-12 ENCOUNTER — Ambulatory Visit (INDEPENDENT_AMBULATORY_CARE_PROVIDER_SITE_OTHER): Payer: Medicare Other

## 2017-08-12 ENCOUNTER — Encounter: Payer: Self-pay | Admitting: Emergency Medicine

## 2017-08-12 VITALS — BP 112/62 | HR 111 | Temp 100.0°F | Resp 16 | Ht 61.5 in | Wt 127.0 lb

## 2017-08-12 DIAGNOSIS — R079 Chest pain, unspecified: Secondary | ICD-10-CM

## 2017-08-12 DIAGNOSIS — R0600 Dyspnea, unspecified: Secondary | ICD-10-CM | POA: Diagnosis not present

## 2017-08-12 DIAGNOSIS — F411 Generalized anxiety disorder: Secondary | ICD-10-CM | POA: Diagnosis not present

## 2017-08-12 DIAGNOSIS — R0602 Shortness of breath: Secondary | ICD-10-CM | POA: Diagnosis not present

## 2017-08-12 NOTE — Patient Instructions (Addendum)
IF you received an x-ray today, you will receive an invoice from [facility]. Please contact [facility] at [phone] with questions or concerns regarding your invoice.   IF you received labwork today, you will receive an invoice from [facility]. Please contact [facility] at [phone] with questions or concerns regarding your invoice.   Our billing staff will not be able to assist you with questions regarding bills from these companies.  You will be contacted with the lab results as soon as they are available. The fastest way to get your results is to activate your My Chart account. Instructions are located on the last page of this paperwork. If you have not heard from [place] regarding the results in 2 weeks, please contact this office.     Angina Pectoris Angina pectoris is a very bad feeling in the chest, neck, or arm. Your doctor may call it angina. There are four types of angina. Angina is caused by a lack of blood in the middle and thickest layer of the heart wall (myocardium). Angina may feel like a crushing or squeezing pain in the chest. It may feel like tightness or heavy pressure in the chest. Some people say it feels like gas, heartburn, or indigestion. Some people have symptoms other than pain. These include:  Shortness of breath.  Cold sweats.  Feeling sick to your stomach (nausea).  Feeling light-headed.  Many women have chest discomfort and some of the other symptoms. However, women often have different symptoms, such as:  Feeling tired (fatigue).  Feeling nervous for no reason.  Feeling weak for no reason.  Dizziness or fainting.  Women may have angina without any symptoms. Follow these instructions at home:  Take medicines only as told by your doctor.  Take care of other health issues as told by your doctor. These include: ? High blood pressure (hypertension). ? Diabetes.  Follow a heart-healthy diet. Your doctor can help you to choose  healthy food options and make changes.  Talk to your doctor to learn more about healthy cooking methods and use them. These include: ? Roasting. ? Grilling. ? Broiling. ? Baking. ? Poaching. ? Steaming. ? Stir-frying.  Follow an exercise program approved by your doctor.  Keep a healthy weight. Lose weight as told by your doctor.  Rest when you are tired.  Learn to manage stress.  Do not use any tobacco, such as cigarettes, chewing tobacco, or electronic cigarettes. If you need help quitting, ask your doctor.  If you drink alcohol, and your doctor says it is okay, limit yourself to no more than 1 drink per day. One drink equals 12 ounces of beer, 5 ounces of wine, or 1 ounces of hard liquor.  Stop illegal drug use.  Keep all follow-up visits as told by your doctor. This is important. Do not take these medicines unless your doctor says that you can:  Nonsteroidal anti-inflammatory drugs (NSAIDs). These include: ? Ibuprofen. ? Naproxen. ? Celecoxib.  Vitamin supplements that have vitamin A, vitamin E, or both.  Hormone therapy that contains estrogen with or without progestin.  Get help right away if:  You have pain in your chest, neck, arm, jaw, stomach, or back that: ? Lasts more than a few minutes. ? Comes back. ? Does not get better after you take medicine under your tongue (sublingual nitroglycerin).  You have any of these symptoms for no reason: ? Gas, heartburn, or indigestion. ? Sweating a lot. ? Shortness of breath or trouble breathing. ?  Feeling sick to your stomach or throwing up. ? Feeling more tired than usual. ? Feeling nervous or worrying more than usual. ? Feeling weak. ? Diarrhea.  You are suddenly dizzy or light-headed.  You faint or pass out. These symptoms may be an emergency. Do not wait to see if the symptoms will go away. Get medical help right away. Call your local emergency services (911 in the [place] Do not drive yourself to the  hospital. This information is not intended to replace advice given to you by your health care provider. Make sure you discuss any questions you have with your health care provider. Document Released: 03/16/2008 Document Revised: 03/05/2016 Document Reviewed: 01/30/2014 Elsevier Interactive Patient Education  2017 [employer].

## 2017-08-12 NOTE — Progress Notes (Signed)
[name] [name] 83 y.o.   Chief Complaint  Patient presents with  . Shortness of Breath    per patient when taking deep breaths chest hurt 08/11/2017    HISTORY OF PRESENT ILLNESS: This is a 83 y.o. female complaining of chest discomfort and SOB since last night. No associated symptoms. States chest hurts when she takes deep breath; she gets anxious and develops dyspnea sensation. On and off.   HPI   Prior to Admission medications   Medication Sig Start Date End Date Taking? Authorizing Provider  acetaminophen (TYLENOL) 500 MG tablet Take 500 mg by mouth every 6 (six) hours as needed for mild pain, moderate pain, fever or headache.   Yes [provider]  amLODipine (NORVASC) 5 MG tablet Take 1 tablet by mouth daily. 11/12/15  Yes [provider]  DiazePAM (VALIUM PO) Take 5 mg by mouth daily.   Yes [provider]  irbesartan (AVAPRO) 300 MG tablet Take 1 tablet by mouth daily. 11/12/15  Yes [provider]  meclizine (ANTIVERT) 25 MG tablet Take 12.5-25 mg by mouth 2 (two) times daily as needed for nausea.   Yes [provider]  metoCLOPramide (REGLAN) 10 MG tablet Take 1 tablet (10 mg total) by mouth every 6 (six) hours as needed for nausea. 05/05/16  Yes Sam, Serena Y, PA-C  traMADol Veatrice Bourbon) 50 MG tablet  10/19/16  Yes [provider]    Allergies  Allergen Reactions  . Codeine Nausea And Vomiting    Patient Active Problem List   Diagnosis Date Noted  . Acute blood loss anemia 03/06/2013  . Anxiety state, unspecified 03/06/2013  . Depression 03/06/2013  . Unspecified constipation 03/06/2013  . Fracture tibia/fibula 12/02/2012  . Hypokalemia 12/02/2012  . S/P AVR (aortic valve replacement) and aortoplasty 09/30/2012  . HTN (hypertension) 09/30/2012  . Hypertension   . Hypercholesterolemia   . Paroxysmal atrial fibrillation (HCC)   . Aortic aneurysm, thoracic ([place])   . Aortic insufficiency     Past Medical History:    Diagnosis Date  . Acute blood loss anemia    Postoperative  . Anemia   . Aortic aneurysm, thoracic ([place])   . Aortic insufficiency   . Cancer ([place])    colon  . Depression with anxiety   . Hypercholesterolemia   . Hypertension   . Paroxysmal atrial fibrillation (HCC)   . S/P AVR (aortic valve replacement) and aortoplasty 09/30/2012  . Thrombocytopenia ([place])    Postoperative thrombocytopenia    Past Surgical History:  Procedure Laterality Date  . AORTIC VALVE REPLACEMENT  03/20/2009   mild aortic stenosis & mod to severe A1, lt verticular wall thickness was normal & lt ventricular function was estimated to be 55 to 60%,no mitral valve regugitation or stenosis  . COLON SURGERY  1973   for colon cancer  . COLONOSCOPY  12/29/2011   Procedure: COLONOSCOPY;  Surgeon: Cleotis Nipper, MD;  Location: [facility] ENDOSCOPY;  Service: Endoscopy;  Laterality: N/A;  . COLONOSCOPY  08/30/2012   Procedure: COLONOSCOPY;  Surgeon: Cleotis Nipper, MD;  Location: [facility] ENDOSCOPY;  Service: Endoscopy;  Laterality: N/A;  . HOT HEMOSTASIS  12/29/2011   Procedure: HOT HEMOSTASIS (ARGON PLASMA COAGULATION/BICAP);  Surgeon: Cleotis Nipper, MD;  Location: [facility] ENDOSCOPY;  Service: Endoscopy;  Laterality: N/A;  . HOT HEMOSTASIS  08/30/2012   Procedure: HOT HEMOSTASIS (ARGON PLASMA COAGULATION/BICAP);  Surgeon: Cleotis Nipper, MD;  Location: [facility] ENDOSCOPY;  Service: Endoscopy;  Laterality: N/A;  . ROTATOR CUFF  REPAIR     Right Shoulder  . TIBIA IM NAIL INSERTION Left 12/03/2012   Procedure: INTRAMEDULLARY (IM) NAIL TIBIAL;  Surgeon: Mauri Pole, MD;  Location: [facility] ORS;  Service: Orthopedics;  Laterality: Left;    Social History   Social History  . Marital status: Widowed    Spouse name: N/A  . Number of children: 0  . Years of education: N/A   Occupational History  . drug store worker Retired   Social History Main Topics  . Smoking status: Never Smoker  . Smokeless tobacco: Never Used  . Alcohol use No   . Drug use: No  . Sexual activity: No   Other Topics Concern  . Not on file   Social History Narrative  . No narrative on file    Family History  Problem Relation Age of Onset  . Parkinsonism Mother   . Hypertension Father      Review of Systems  Constitutional: Negative for chills and fever.  HENT: Negative.  Negative for sore throat.   Eyes: Negative for discharge and redness.  Respiratory: Positive for cough and shortness of breath. Negative for hemoptysis and wheezing.   Cardiovascular: Positive for chest pain. Negative for palpitations and leg swelling.  Gastrointestinal: Negative for abdominal pain, diarrhea, nausea and vomiting.  Genitourinary: Negative for dysuria and hematuria.  Musculoskeletal: Negative for back pain, myalgias and neck pain.  Skin: Negative.  Negative for rash.  Neurological: Negative.   Endo/Heme/Allergies: Negative.   Psychiatric/Behavioral: The patient is nervous/anxious.   All other systems reviewed and are negative.  Vitals:   08/12/17 1559  BP: 112/62  Pulse: (!) 111  Resp: 16  Temp: 100 F (37.8 C)  SpO2: 96%     Physical Exam  Constitutional: She is oriented to person, place, and time. She appears well-developed and well-nourished.  HENT:  Head: Normocephalic and atraumatic.  Nose: Nose normal.  Mouth/Throat: Oropharynx is clear and moist.  Eyes: Pupils are equal, round, and reactive to light. Conjunctivae and EOM are normal.  Neck: Normal range of motion. Neck supple. No JVD present. No thyromegaly present.  Cardiovascular: Normal rate, regular rhythm and normal heart sounds.   Repeat HR at bedside: 82 and regular  Pulmonary/Chest: Effort normal and breath sounds normal.  Abdominal: Soft. Bowel sounds are normal.  Musculoskeletal: Normal range of motion.  Lymphadenopathy:    She has no cervical adenopathy.  Neurological: She is alert and oriented to person, place, and time. No sensory deficit. She exhibits normal muscle  tone.  Skin: Skin is warm and dry. Capillary refill takes less than 2 seconds. No rash noted.  Psychiatric: She has a normal mood and affect. Her behavior is normal.  Vitals reviewed.  NSR with VR84 No acute ischemic changes PR normal QRS normal compared with prior EKG's; no changes. No acute ischemic changes.  Dg Chest 2 View  Result Date: 08/12/2017 CLINICAL DATA:  Shortness of breath.  Chest pain. EXAM: CHEST  2 VIEW COMPARISON:  Two-view chest x-ray 02/14/2015. FINDINGS: The heart size is normal. Lungs are hyperinflated. There is no edema or effusion. No focal airspace disease is present. Aortic atherosclerosis is again seen. The visualized soft tissues and bony thorax are unremarkable. IMPRESSION: 1. No acute cardiopulmonary disease or significant interval change. 2. Stable hyperinflation of both lungs. 3. Aortic atherosclerosis. Electronically Signed   By: San Morelle M.D.   On: 08/12/2017 16:40    ASSESSMENT & PLAN: Pt clinically stable. No red flag findings. It  doesn't appear to be an acute coronary event. Caretaker in the room with patient. Advised to go to ER if clinical picture changes or symptoms worsen. Patient feels well enough to go home and prefers to do so. Stable with proper support. [name] [name] was seen today for shortness of breath.  Diagnoses and all orders for this visit:  Chest pain, unspecified type -     EKG 12-Lead -     DG Chest 2 View; Future  Anxiety state  Dyspnea, unspecified type    Patient Instructions       IF you received an x-ray today, you will receive an invoice from [facility]. Please contact [facility] at [phone] with questions or concerns regarding your invoice.   IF you received labwork today, you will receive an invoice from [facility]. Please contact [facility] at [phone] with questions or concerns regarding your invoice.   Our billing staff will not be able to assist you with questions regarding bills from  these companies.  You will be contacted with the lab results as soon as they are available. The fastest way to get your results is to activate your My Chart account. Instructions are located on the last page of this paperwork. If you have not heard from [place] regarding the results in 2 weeks, please contact this office.     Angina Pectoris Angina pectoris is a very bad feeling in the chest, neck, or arm. Your doctor may call it angina. There are four types of angina. Angina is caused by a lack of blood in the middle and thickest layer of the heart wall (myocardium). Angina may feel like a crushing or squeezing pain in the chest. It may feel like tightness or heavy pressure in the chest. Some people say it feels like gas, heartburn, or indigestion. Some people have symptoms other than pain. These include:  Shortness of breath.  Cold sweats.  Feeling sick to your stomach (nausea).  Feeling light-headed.  Many women have chest discomfort and some of the other symptoms. However, women often have different symptoms, such as:  Feeling tired (fatigue).  Feeling nervous for no reason.  Feeling weak for no reason.  Dizziness or fainting.  Women may have angina without any symptoms. Follow these instructions at home:  Take medicines only as told by your doctor.  Take care of other health issues as told by your doctor. These include: ? High blood pressure (hypertension). ? Diabetes.  Follow a heart-healthy diet. Your doctor can help you to choose healthy food options and make changes.  Talk to your doctor to learn more about healthy cooking methods and use them. These include: ? Roasting. ? Grilling. ? Broiling. ? Baking. ? Poaching. ? Steaming. ? Stir-frying.  Follow an exercise program approved by your doctor.  Keep a healthy weight. Lose weight as told by your doctor.  Rest when you are tired.  Learn to manage stress.  Do not use any tobacco, such as cigarettes, chewing  tobacco, or electronic cigarettes. If you need help quitting, ask your doctor.  If you drink alcohol, and your doctor says it is okay, limit yourself to no more than 1 drink per day. One drink equals 12 ounces of beer, 5 ounces of wine, or 1 ounces of hard liquor.  Stop illegal drug use.  Keep all follow-up visits as told by your doctor. This is important. Do not take these medicines unless your doctor says that you can:  Nonsteroidal anti-inflammatory drugs (NSAIDs). These include: ?  Ibuprofen. ? Naproxen. ? Celecoxib.  Vitamin supplements that have vitamin A, vitamin E, or both.  Hormone therapy that contains estrogen with or without progestin.  Get help right away if:  You have pain in your chest, neck, arm, jaw, stomach, or back that: ? Lasts more than a few minutes. ? Comes back. ? Does not get better after you take medicine under your tongue (sublingual nitroglycerin).  You have any of these symptoms for no reason: ? Gas, heartburn, or indigestion. ? Sweating a lot. ? Shortness of breath or trouble breathing. ? Feeling sick to your stomach or throwing up. ? Feeling more tired than usual. ? Feeling nervous or worrying more than usual. ? Feeling weak. ? Diarrhea.  You are suddenly dizzy or light-headed.  You faint or pass out. These symptoms may be an emergency. Do not wait to see if the symptoms will go away. Get medical help right away. Call your local emergency services (911 in the [place] Do not drive yourself to the hospital. This information is not intended to replace advice given to you by your health care provider. Make sure you discuss any questions you have with your health care provider. Document Released: 03/16/2008 Document Revised: 03/05/2016 Document Reviewed: 01/30/2014 Elsevier Interactive Patient Education  2017 Elsevier Inc.     Agustina Caroli, MD [facility]

## 2017-08-19 ENCOUNTER — Ambulatory Visit (HOSPITAL_COMMUNITY): Payer: Medicare Other | Attending: Cardiology

## 2017-08-19 ENCOUNTER — Other Ambulatory Visit: Payer: Self-pay

## 2017-08-19 DIAGNOSIS — I10 Essential (primary) hypertension: Secondary | ICD-10-CM | POA: Diagnosis not present

## 2017-08-19 DIAGNOSIS — I712 Thoracic aortic aneurysm, without rupture, unspecified: Secondary | ICD-10-CM

## 2017-08-19 DIAGNOSIS — Z952 Presence of prosthetic heart valve: Secondary | ICD-10-CM | POA: Diagnosis not present

## 2017-08-19 DIAGNOSIS — E7800 Pure hypercholesterolemia, unspecified: Secondary | ICD-10-CM

## 2017-08-26 DIAGNOSIS — F411 Generalized anxiety disorder: Secondary | ICD-10-CM | POA: Diagnosis not present

## 2017-09-10 DIAGNOSIS — F330 Major depressive disorder, recurrent, mild: Secondary | ICD-10-CM | POA: Diagnosis not present

## 2017-09-15 DIAGNOSIS — J069 Acute upper respiratory infection, unspecified: Secondary | ICD-10-CM | POA: Diagnosis not present

## 2017-10-28 DIAGNOSIS — F339 Major depressive disorder, recurrent, unspecified: Secondary | ICD-10-CM | POA: Diagnosis not present

## 2017-11-08 DIAGNOSIS — F411 Generalized anxiety disorder: Secondary | ICD-10-CM | POA: Diagnosis not present

## 2017-11-08 DIAGNOSIS — F339 Major depressive disorder, recurrent, unspecified: Secondary | ICD-10-CM | POA: Diagnosis not present

## 2017-11-22 NOTE — Progress Notes (Signed)
[name] [name] Date of Birth: August 17, 1930 Medical Record #[id number]  History of Present Illness: [name] [name] is seen for follow up valvular heart disease.   She is s/p bioprosthetic aortic valve replacement with aortic root grafting in June of 2010 by Dr. Cyndia Bent for a large thoracic aneurysm and AI. Other issues include HTN,  anxiety/depression. No known CAD per prior coronary CT - she was never cathed prior to her surgery in 2010. Echo in October 2014 and again in November 2018 was unremarkable.  On follow up today she is doing very well. No chest pain or dyspnea. No palpitations or edema. Notes varicose veins in her legs.  No prior history of CVA, TIA, or bleeding.   Current Outpatient Medications  Medication Sig Dispense Refill  . acetaminophen (TYLENOL) 500 MG tablet Take 500 mg by mouth every 6 (six) hours as needed for mild pain, moderate pain, fever or headache.    Marland Kitchen amLODipine (NORVASC) 5 MG tablet Take 1 tablet by mouth daily.  0  . DiazePAM (VALIUM PO) Take 5 mg by mouth daily.    . irbesartan (AVAPRO) 300 MG tablet Take 1 tablet by mouth daily.  0  . meclizine (ANTIVERT) 25 MG tablet Take 12.5-25 mg by mouth 2 (two) times daily as needed for nausea.    . metoCLOPramide (REGLAN) 10 MG tablet Take 1 tablet (10 mg total) by mouth every 6 (six) hours as needed for nausea. 30 tablet 0  . traMADol (ULTRAM) 50 MG tablet   0   No current facility-administered medications for this visit.     Allergies  Allergen Reactions  . Codeine Nausea And Vomiting    Past Medical History:  Diagnosis Date  . Acute blood loss anemia    Postoperative  . Anemia   . Aortic aneurysm, thoracic ([place])   . Aortic insufficiency   . Cancer ([place])    colon  . Depression with anxiety   . Hypercholesterolemia   . Hypertension   . Paroxysmal atrial fibrillation (HCC)   . S/P AVR (aortic valve replacement) and aortoplasty 09/30/2012  . Thrombocytopenia ([place])    Postoperative thrombocytopenia     Past Surgical History:  Procedure Laterality Date  . AORTIC VALVE REPLACEMENT  03/20/2009   mild aortic stenosis & mod to severe A1, lt verticular wall thickness was normal & lt ventricular function was estimated to be 55 to 60%,no mitral valve regugitation or stenosis  . COLON SURGERY  1973   for colon cancer  . COLONOSCOPY  12/29/2011   Procedure: COLONOSCOPY;  Surgeon: Cleotis Nipper, MD;  Location: [facility] ENDOSCOPY;  Service: Endoscopy;  Laterality: N/A;  . COLONOSCOPY  08/30/2012   Procedure: COLONOSCOPY;  Surgeon: Cleotis Nipper, MD;  Location: [facility] ENDOSCOPY;  Service: Endoscopy;  Laterality: N/A;  . HOT HEMOSTASIS  12/29/2011   Procedure: HOT HEMOSTASIS (ARGON PLASMA COAGULATION/BICAP);  Surgeon: Cleotis Nipper, MD;  Location: [facility] ENDOSCOPY;  Service: Endoscopy;  Laterality: N/A;  . HOT HEMOSTASIS  08/30/2012   Procedure: HOT HEMOSTASIS (ARGON PLASMA COAGULATION/BICAP);  Surgeon: Cleotis Nipper, MD;  Location: [facility] ENDOSCOPY;  Service: Endoscopy;  Laterality: N/A;  . ROTATOR CUFF REPAIR     Right Shoulder  . TIBIA IM NAIL INSERTION Left 12/03/2012   Procedure: INTRAMEDULLARY (IM) NAIL TIBIAL;  Surgeon: Mauri Pole, MD;  Location: [facility] ORS;  Service: Orthopedics;  Laterality: Left;    Social History   Tobacco Use  Smoking Status Never Smoker  Smokeless Tobacco Never  Used    Social History   Substance and Sexual Activity  Alcohol Use No    Family History  Problem Relation Age of Onset  . Parkinsonism Mother   . Hypertension Father     Review of Systems: The review of systems is per the HPI.  All other systems were reviewed and are negative.  Physical Exam: LMP  (LMP Unknown)  GENERAL:  Well appearing HEENT:  PERRL, EOMI, sclera are clear. Oropharynx is clear. NECK:  No jugular venous distention, carotid upstroke brisk and symmetric, no bruits, no thyromegaly or adenopathy LUNGS:  Clear to auscultation bilaterally CHEST:  Unremarkable HEART:  RRR,  PMI not  displaced or sustained,S1 and S2 within normal limits, no S3, no S4: no clicks, no rubs, soft SEM RUSB.  ABD:  Soft, nontender. BS +, no masses or bruits. No hepatomegaly, no splenomegaly EXT:  2 + pulses throughout, no edema, no cyanosis no clubbing SKIN:  Warm and dry.  No rashes. Varicose veins. NEURO:  Alert and oriented x 3. Cranial nerves II through XII intact. PSYCH:  Cognitively intact      LABORATORY DATA:    Lab Results  Component Value Date   WBC 5.5 05/05/2016   HGB 11.3 (L) 05/05/2016   HCT 34.4 (L) 05/05/2016   PLT 223 05/05/2016   GLUCOSE 139 (H) 05/05/2016   CHOL 196 07/18/2013   TRIG 78.0 07/18/2013   HDL 62.90 07/18/2013   LDLCALC 118 (H) 07/18/2013   ALT 15 05/05/2016   AST 25 05/05/2016   NA 134 (L) 05/05/2016   K 3.2 (L) 05/05/2016   CL 103 05/05/2016   CREATININE 0.74 05/05/2016   BUN 10 05/05/2016   CO2 22 05/05/2016   TSH 1.71 07/18/2013   INR 1.7 (H) 03/20/2009   HGBA1C  03/18/2009    5.8 (NOTE) The ADA recommends the following therapeutic goal for glycemic control related to Hgb A1c measurement: Goal of therapy: <6.5 Hgb A1c  Reference: American Diabetes Association: Clinical Practice Recommendations 2010, Diabetes Care, 2010, 33: (Suppl  1).   Labs dated 03/30/16: cholesterol 178, triglyderides 73, HDL 66, LDL 97.  Dated 06/10/17: cholesterol 193, triglycerides 49. HDL 77, LDL 106. Dated 07/27/17: normal CBC and CMET.   Echo 08/19/17: Study Conclusions  - Left ventricle: The cavity size was normal. Wall thickness was   increased in a pattern of mild LVH. Systolic function was normal.   The estimated ejection fraction was in the range of 60% to 65%.   Wall motion was normal; there were no regional wall motion   abnormalities. Doppler parameters are consistent with abnormal   left ventricular relaxation (grade 1 diastolic dysfunction). - Aortic valve: There was a bioprosthetic aortic valve. There was   no stenosis. There was no regurgitation.  Mean gradient (S): 8 mm   Hg. - Mitral valve: There was trivial regurgitation. - Left atrium: The atrium was mildly dilated. - Right ventricle: The cavity size was normal. Systolic function   was normal. - Right atrium: The atrium was mildly dilated. - Tricuspid valve: Peak RV-RA gradient (S): 15 mm Hg. - Pulmonary arteries: PA peak pressure: 18 mm Hg (S). - Inferior vena cava: The vessel was normal in size. The   respirophasic diameter changes were in the normal range (>= 50%),   consistent with normal central venous pressure.  Impressions:  - Normal LV size with mild LV hypertrophy. EF 60-65%. Normal RV   size and systolic function. Bioprosthetic aortic valve with  normal function.   Assessment / Plan: 1.S/p aortic valve and aortic root using a 21-mm Medtronic Freestyle aortic root heart valve prosthesis with reimplantation of the coronary arteries, resection and grafting of ascending aortic aneurysm using a 28-mm Hemashield tube graft 2010. Echo in November 2018 showed good prosthetic valve function.   2. HTN under good control  3.  PACs asymptomatic.

## 2017-11-23 DIAGNOSIS — H52223 Regular astigmatism, bilateral: Secondary | ICD-10-CM | POA: Diagnosis not present

## 2017-11-26 ENCOUNTER — Ambulatory Visit: Payer: Medicare Other | Admitting: Cardiology

## 2017-11-26 ENCOUNTER — Encounter: Payer: Self-pay | Admitting: Cardiology

## 2017-11-26 VITALS — BP 140/70 | HR 96 | Ht 63.0 in | Wt 128.4 lb

## 2017-11-26 DIAGNOSIS — Z952 Presence of prosthetic heart valve: Secondary | ICD-10-CM

## 2017-11-26 DIAGNOSIS — I712 Thoracic aortic aneurysm, without rupture, unspecified: Secondary | ICD-10-CM

## 2017-11-26 DIAGNOSIS — I10 Essential (primary) hypertension: Secondary | ICD-10-CM

## 2017-11-26 DIAGNOSIS — I491 Atrial premature depolarization: Secondary | ICD-10-CM

## 2017-11-26 NOTE — Patient Instructions (Signed)
Continue your current therapy  I will see you one year.   

## 2017-12-03 DIAGNOSIS — R1013 Epigastric pain: Secondary | ICD-10-CM | POA: Diagnosis not present

## 2017-12-03 DIAGNOSIS — K5900 Constipation, unspecified: Secondary | ICD-10-CM | POA: Diagnosis not present

## 2017-12-03 DIAGNOSIS — Z86010 Personal history of colonic polyps: Secondary | ICD-10-CM | POA: Diagnosis not present

## 2017-12-23 DIAGNOSIS — I10 Essential (primary) hypertension: Secondary | ICD-10-CM | POA: Diagnosis not present

## 2017-12-23 DIAGNOSIS — H8110 Benign paroxysmal vertigo, unspecified ear: Secondary | ICD-10-CM | POA: Diagnosis not present

## 2017-12-23 DIAGNOSIS — F339 Major depressive disorder, recurrent, unspecified: Secondary | ICD-10-CM | POA: Diagnosis not present

## 2017-12-23 DIAGNOSIS — F419 Anxiety disorder, unspecified: Secondary | ICD-10-CM | POA: Diagnosis not present

## 2017-12-27 DIAGNOSIS — F419 Anxiety disorder, unspecified: Secondary | ICD-10-CM | POA: Diagnosis not present

## 2017-12-27 DIAGNOSIS — I10 Essential (primary) hypertension: Secondary | ICD-10-CM | POA: Diagnosis not present

## 2017-12-27 DIAGNOSIS — F339 Major depressive disorder, recurrent, unspecified: Secondary | ICD-10-CM | POA: Diagnosis not present

## 2017-12-28 ENCOUNTER — Other Ambulatory Visit: Payer: Self-pay | Admitting: Gastroenterology

## 2017-12-28 DIAGNOSIS — R1013 Epigastric pain: Secondary | ICD-10-CM

## 2017-12-31 ENCOUNTER — Other Ambulatory Visit: Payer: Self-pay | Admitting: Gastroenterology

## 2017-12-31 ENCOUNTER — Ambulatory Visit
Admission: RE | Admit: 2017-12-31 | Discharge: 2017-12-31 | Disposition: A | Discharge status: Home / Self Care | Payer: Medicare Other | Source: Ambulatory Visit | Attending: Gastroenterology | Admitting: Gastroenterology

## 2017-12-31 DIAGNOSIS — K449 Diaphragmatic hernia without obstruction or gangrene: Secondary | ICD-10-CM | POA: Diagnosis not present

## 2017-12-31 DIAGNOSIS — R1013 Epigastric pain: Secondary | ICD-10-CM

## 2018-01-13 DIAGNOSIS — F419 Anxiety disorder, unspecified: Secondary | ICD-10-CM | POA: Diagnosis not present

## 2018-01-13 DIAGNOSIS — F339 Major depressive disorder, recurrent, unspecified: Secondary | ICD-10-CM | POA: Diagnosis not present

## 2018-01-18 DIAGNOSIS — F419 Anxiety disorder, unspecified: Secondary | ICD-10-CM | POA: Diagnosis not present

## 2018-01-21 DIAGNOSIS — F419 Anxiety disorder, unspecified: Secondary | ICD-10-CM | POA: Diagnosis not present

## 2018-01-21 DIAGNOSIS — F339 Major depressive disorder, recurrent, unspecified: Secondary | ICD-10-CM | POA: Diagnosis not present

## 2018-01-21 DIAGNOSIS — R110 Nausea: Secondary | ICD-10-CM | POA: Diagnosis not present

## 2018-01-22 ENCOUNTER — Emergency Department (HOSPITAL_BASED_OUTPATIENT_CLINIC_OR_DEPARTMENT_OTHER): Payer: Medicare Other

## 2018-01-22 ENCOUNTER — Emergency Department (HOSPITAL_BASED_OUTPATIENT_CLINIC_OR_DEPARTMENT_OTHER)
Admission: EM | Admit: 2018-01-22 | Discharge: 2018-01-22 | Disposition: A | Discharge status: Home / Self Care | Payer: Medicare Other | Attending: Emergency Medicine | Admitting: Emergency Medicine

## 2018-01-22 ENCOUNTER — Other Ambulatory Visit: Payer: Self-pay

## 2018-01-22 ENCOUNTER — Encounter (HOSPITAL_BASED_OUTPATIENT_CLINIC_OR_DEPARTMENT_OTHER): Payer: Self-pay

## 2018-01-22 DIAGNOSIS — J449 Chronic obstructive pulmonary disease, unspecified: Secondary | ICD-10-CM | POA: Diagnosis not present

## 2018-01-22 DIAGNOSIS — R630 Anorexia: Secondary | ICD-10-CM | POA: Diagnosis not present

## 2018-01-22 DIAGNOSIS — Z954 Presence of other heart-valve replacement: Secondary | ICD-10-CM | POA: Insufficient documentation

## 2018-01-22 DIAGNOSIS — Z85038 Personal history of other malignant neoplasm of large intestine: Secondary | ICD-10-CM | POA: Insufficient documentation

## 2018-01-22 DIAGNOSIS — R110 Nausea: Secondary | ICD-10-CM | POA: Insufficient documentation

## 2018-01-22 DIAGNOSIS — I10 Essential (primary) hypertension: Secondary | ICD-10-CM | POA: Diagnosis not present

## 2018-01-22 LAB — COMPREHENSIVE METABOLIC PANEL
ALK PHOS: 56 U/L (ref 38–126)
ALT: 17 U/L (ref 14–54)
AST: 28 U/L (ref 15–41)
Albumin: 4.2 g/dL (ref 3.5–5.0)
Anion gap: 10 (ref 5–15)
BUN: 17 mg/dL (ref 6–20)
CALCIUM: 9.1 mg/dL (ref 8.9–10.3)
CHLORIDE: 102 mmol/L (ref 101–111)
CO2: 21 mmol/L — AB (ref 22–32)
CREATININE: 0.83 mg/dL (ref 0.44–1.00)
GFR calc Af Amer: >60 mL/min (ref >60)
GFR calc non Af Amer: >60 mL/min (ref >60)
GLUCOSE: 116 mg/dL — AB (ref 65–99)
Potassium: 3.4 mmol/L — ABNORMAL LOW (ref 3.5–5.1)
SODIUM: 133 mmol/L — AB (ref 135–145)
Total Bilirubin: 0.7 mg/dL (ref 0.3–1.2)
Total Protein: 7.3 g/dL (ref 6.5–8.1)

## 2018-01-22 LAB — CBC WITH DIFFERENTIAL/PLATELET
Basophils Absolute: 0 10*3/uL (ref 0.0–0.1)
Basophils Relative: 1 %
EOS ABS: 0 10*3/uL (ref 0.0–0.7)
EOS PCT: 1 %
HCT: 34.5 % — ABNORMAL LOW (ref 36.0–46.0)
Hemoglobin: 11.5 g/dL — ABNORMAL LOW (ref 12.0–15.0)
LYMPHS ABS: 0.7 10*3/uL (ref 0.7–4.0)
LYMPHS PCT: 17 %
MCH: 29 pg (ref 26.0–34.0)
MCHC: 33.3 g/dL (ref 30.0–36.0)
MCV: 87.1 fL (ref 78.0–100.0)
MONO ABS: 0.4 10*3/uL (ref 0.1–1.0)
MONOS PCT: 8 %
Neutro Abs: 3.1 10*3/uL (ref 1.7–7.7)
Neutrophils Relative %: 73 %
PLATELETS: 199 10*3/uL (ref 150–400)
RBC: 3.96 MIL/uL (ref 3.87–5.11)
RDW: 13.9 % (ref 11.5–15.5)
WBC: 4.3 10*3/uL (ref 4.0–10.5)

## 2018-01-22 LAB — URINALYSIS, ROUTINE W REFLEX MICROSCOPIC
BILIRUBIN URINE: NEGATIVE
GLUCOSE, UA: NEGATIVE mg/dL
HGB URINE DIPSTICK: NEGATIVE
Ketones, ur: NEGATIVE mg/dL
Leukocytes, UA: NEGATIVE
Nitrite: NEGATIVE
PROTEIN: NEGATIVE mg/dL
Specific Gravity, Urine: <1.005 — ABNORMAL LOW (ref 1.005–1.030)
pH: 7 (ref 5.0–8.0)

## 2018-01-22 LAB — LIPASE, BLOOD: Lipase: 42 U/L (ref 11–51)

## 2018-01-22 LAB — TROPONIN I: Troponin I: <0.03 ng/mL (ref <0.03)

## 2018-01-22 MED ORDER — ONDANSETRON 4 MG PO TBDP: 4.0000 mg | ORAL_TABLET | Freq: Three times a day (TID) | ORAL | 0 refills | Status: DC | PRN

## 2018-01-22 MED ORDER — SODIUM CHLORIDE 0.9 % IV BOLUS
500.0000 mL | Freq: Once | INTRAVENOUS | Status: AC
  Administered 2018-01-22: 500 mL via INTRAVENOUS

## 2018-01-22 MED ORDER — ONDANSETRON HCL 4 MG/2ML IJ SOLN
4.0000 mg | Freq: Once | INTRAMUSCULAR | Status: AC
  Administered 2018-01-22: 4 mg via INTRAVENOUS
  Filled 2018-01-22: qty 2

## 2018-01-22 NOTE — ED Notes (Signed)
Pt drank all of her coke.

## 2018-01-22 NOTE — Discharge Instructions (Signed)
You have been seen in the Emergency Department (ED) today for nausea.  Your work up today has not shown a clear cause for your symptoms. You have been prescribed Zofran; please use as prescribed as needed for your nausea.  Follow up with your doctor as soon as possible regarding today?s emergent visit and your symptoms of nausea.   Return to the ED if you develop abdominal, bloody vomiting, bloody diarrhea, if you are unable to tolerate fluids due to vomiting, or if you develop other symptoms that concern you.

## 2018-01-22 NOTE — ED Notes (Signed)
Pt made aware of need for urine specimen. Pt transported to XR at this time.

## 2018-01-22 NOTE — ED Provider Notes (Signed)
Emergency Department Provider Note   I have reviewed the triage vital signs and the nursing notes.   HISTORY  Chief Complaint Nausea   HPI [name] [name] is a 84 y.o. female with PMH of aortic insufficiency, anemia, PAF, HLD, and HTN presents to the emergency department for evaluation of persistent nausea over the past 5 days.  The patient has not had any episodes of vomiting or diarrhea.  She denies any abdominal pain, back pain, dysuria, or chest pain.  No recent changes to medications. No sick contacts.  No fevers or chills.  Patient denies any coughing.  She has been eating and drinking less because of the nausea.    Past Medical History:  Diagnosis Date  . Acute blood loss anemia    Postoperative  . Anemia   . Aortic aneurysm, thoracic ([place])   . Aortic insufficiency   . Cancer ([place])    colon  . Depression with anxiety   . Hypercholesterolemia   . Hypertension   . Paroxysmal atrial fibrillation (H[facility])   . S/P AVR (aortic valve replacement) and aortoplasty 09/30/2012  . Thrombocytopenia ([place])    Postoperative thrombocytopenia    Patient Active Problem List   Diagnosis Date Noted  . Chest pain 08/12/2017  . Dyspnea 08/12/2017  . Acute blood loss anemia 03/06/2013  . Anxiety state 03/06/2013  . Depression 03/06/2013  . Unspecified constipation 03/06/2013  . Fracture tibia/fibula 12/02/2012  . Hypokalemia 12/02/2012  . S/P AVR (aortic valve replacement) and aortoplasty 09/30/2012  . HTN (hypertension) 09/30/2012  . Hypertension   . Hypercholesterolemia   . Paroxysmal atrial fibrillation (HCC)   . Aortic aneurysm, thoracic ([place])   . Aortic insufficiency     Past Surgical History:  Procedure Laterality Date  . AORTIC VALVE REPLACEMENT  03/20/2009   mild aortic stenosis & mod to severe A1, lt verticular wall thickness was normal & lt ventricular function was estimated to be 55 to 60%,no mitral valve regugitation or stenosis  . COLON SURGERY  1973   for colon  cancer  . COLONOSCOPY  12/29/2011   Procedure: COLONOSCOPY;  Surgeon: Cleotis Nipper, MD;  Location: [facility] ENDOSCOPY;  Service: Endoscopy;  Laterality: N/A;  . COLONOSCOPY  08/30/2012   Procedure: COLONOSCOPY;  Surgeon: Cleotis Nipper, MD;  Location: [facility] ENDOSCOPY;  Service: Endoscopy;  Laterality: N/A;  . HOT HEMOSTASIS  12/29/2011   Procedure: HOT HEMOSTASIS (ARGON PLASMA COAGULATION/BICAP);  Surgeon: Cleotis Nipper, MD;  Location: [facility] ENDOSCOPY;  Service: Endoscopy;  Laterality: N/A;  . HOT HEMOSTASIS  08/30/2012   Procedure: HOT HEMOSTASIS (ARGON PLASMA COAGULATION/BICAP);  Surgeon: Cleotis Nipper, MD;  Location: [facility] ENDOSCOPY;  Service: Endoscopy;  Laterality: N/A;  . ROTATOR CUFF REPAIR     Right Shoulder  . TIBIA IM NAIL INSERTION Left 12/03/2012   Procedure: INTRAMEDULLARY (IM) NAIL TIBIAL;  Surgeon: Mauri Pole, MD;  Location: [facility] ORS;  Service: Orthopedics;  Laterality: Left;    Current Outpatient Rx  . Order #: [id number] Class: Historical Med  . Order #: [id number] Class: Historical Med  . Order #: [id number] Class: Historical Med  . Order #: [id number] Class: Historical Med  . Order #: [id number] Class: Historical Med  . Order #: [id number] Class: Print  . Order #: [id number] Class: Print  . Order #: [id number] Class: Historical Med    Allergies Codeine  Family History  Problem Relation Age of Onset  . Parkinsonism Mother   . Hypertension Father     Social History Social History   Tobacco Use  .  Smoking status: Never Smoker  . Smokeless tobacco: Never Used  Substance Use Topics  . Alcohol use: No  . Drug use: No    Review of Systems  Constitutional: No fever/chills. Positive decreased appetite.  Eyes: No visual changes. ENT: No sore throat. Cardiovascular: Denies chest pain. Respiratory: Denies shortness of breath. Gastrointestinal: No abdominal pain. Positive nausea, no vomiting.  No diarrhea.  No constipation. Genitourinary: Negative for dysuria. Musculoskeletal:  Negative for back pain. Skin: Negative for rash. Neurological: Negative for headaches, focal weakness or numbness.  10-point ROS otherwise negative.  ____________________________________________   PHYSICAL EXAM:  VITAL SIGNS: ED Triage Vitals  Enc Vitals Group     BP 01/22/18 1036 (!) 157/70     Pulse Rate 01/22/18 1034 81     Resp 01/22/18 1034 16     Temp 01/22/18 1034 98.2 F (36.8 C)     Temp Source 01/22/18 1034 Oral     SpO2 01/22/18 1034 95 %     Weight 01/22/18 1034 125 lb (56.7 kg)     Height 01/22/18 1034 5' (1.524 m)     Pain Score 01/22/18 1034 0   Constitutional: Alert and oriented. Well appearing and in no acute distress. Eyes: Conjunctivae are normal.  Head: Atraumatic. Nose: No congestion/rhinnorhea. Mouth/Throat: Mucous membranes are moist.  Neck: No stridor.  Cardiovascular: Normal rate, regular rhythm. Good peripheral circulation. Grossly normal heart sounds.   Respiratory: Normal respiratory effort.  No retractions. Lungs CTAB. Gastrointestinal: Soft and nontender. No distention.  Musculoskeletal: No lower extremity tenderness nor edema. No gross deformities of extremities. Neurologic:  Normal speech and language. No gross focal neurologic deficits are appreciated.  Skin:  Skin is warm, dry and intact. No rash noted.  ____________________________________________   LABS (all labs ordered are listed, but only abnormal results are displayed)  Labs Reviewed  COMPREHENSIVE METABOLIC PANEL - Abnormal; Notable for the following components:      Result Value   Sodium 133 (*)    Potassium 3.4 (*)    CO2 21 (*)    Glucose, Bld 116 (*)    All other components within normal limits  CBC WITH DIFFERENTIAL/PLATELET - Abnormal; Notable for the following components:   Hemoglobin 11.5 (*)    HCT 34.5 (*)    All other components within normal limits  URINALYSIS, ROUTINE W REFLEX MICROSCOPIC - Abnormal; Notable for the following components:   Color, Urine STRAW  (*)    Specific Gravity, Urine <1.005 (*)    All other components within normal limits  LIPASE, BLOOD  TROPONIN I   ____________________________________________  EKG   EKG Interpretation  Date/Time:  Saturday January 22 2018 10:39:23 EDT Ventricular Rate:  74 PR Interval:    QRS Duration: 91 QT Interval:  423 QTC Calculation: 470 R Axis:   86 Text Interpretation:  Sinus rhythm Borderline right axis deviation Borderline T abnormalities, anterior leads Baseline wander in lead(s) V3 V4 No STEMI  Confirmed by Nanda Quinton [phone]) on 01/22/2018 10:54:14 AM Also confirmed by Nanda Quinton [phone]), editor Laurena Spies [phone])  on 01/22/2018 12:32:56 PM       ____________________________________________  RADIOLOGY  Dg Abdomen Acute W/chest  Result Date: 01/22/2018 CLINICAL DATA:  Nausea, fatigue, and decreased appetite for 4 days, history of colon cancer, AVR, hypertension, paroxysmal atrial fibrillation EXAM: DG ABDOMEN ACUTE W/ 1V CHEST COMPARISON:  Abdominal radiograph 12/31/2017, chest radiograph 08/12/2017 FINDINGS: Normal heart size post median sternotomy. Calcified tortuous thoracic aorta. Mediastinal contours and pulmonary vascularity otherwise  stable, with slightly prominent RIGHT paratracheal soft tissues unchanged likely related to tortuous innominate vessel. No mass effect upon trachea. Emphysematous and minimal bronchitic changes consistent with COPD. No acute infiltrate, pleural effusion, or pneumothorax. Nonobstructive bowel gas pattern. No bowel dilatation, bowel wall thickening or free air. Calcified uterine leiomyoma in pelvis. Osseous demineralization with marked dextroconvex lumbar scoliosis. Mild degenerative changes at the shoulders and hips. IMPRESSION: No acute abdominal findings. Calcified uterine leiomyoma. COPD changes without infiltrate. Electronically Signed   By: Lavonia Dana M.D.   On: 01/22/2018 11:29     ____________________________________________   PROCEDURES  Procedure(s) performed:   Procedures  None ____________________________________________   INITIAL IMPRESSION / ASSESSMENT AND PLAN / ED COURSE  Pertinent labs & imaging results that were available during my care of the patient were reviewed by me and considered in my medical decision making (see chart for details).  Patient presents to the emergency department for evaluation of persistent nausea over the past 5 days.  She denies any abdominal pain or chest pain.  A low suspicion for atypical ACS.  EKG reviewed and compared to priors which seems similar.  The patient does appear mildly dehydrated.  Plan for gentle IV fluids along with Zofran.  Plan to obtain urine analysis along with chest/abdomen x-ray.  The patient does describe some constipation with the vomiting but states her constipation is no worse than normal.   Labs and imaging reviewed with no acute findings. Patient feeling much better after IVF and Zofran. Will discharge home with Zofran and plan for PCP follow up.   At this time, I do not feel there is any life-threatening condition present. I have reviewed and discussed all results (EKG, imaging, lab, urine as appropriate), exam findings with patient. I have reviewed nursing notes and appropriate previous records.  I feel the patient is safe to be discharged home without further emergent workup. Discussed usual and customary return precautions. Patient and family (if present) verbalize understanding and are comfortable with this plan.  Patient will follow-up with their primary care provider. If they do not have a primary care provider, information for follow-up has been provided to them. All questions have been answered.  ____________________________________________  FINAL CLINICAL IMPRESSION(S) / ED DIAGNOSES  Final diagnoses:  Nausea     MEDICATIONS GIVEN DURING THIS VISIT:  Medications  sodium  chloride 0.9 % bolus 500 mL (0 mLs Intravenous Stopped 01/22/18 1118)  ondansetron (ZOFRAN) injection 4 mg (4 mg Intravenous Given 01/22/18 1050)     NEW OUTPATIENT MEDICATIONS STARTED DURING THIS VISIT:  Discharge Medication List as of 01/22/2018 12:17 PM    START taking these medications   Details  ondansetron (ZOFRAN ODT) 4 MG disintegrating tablet Take 1 tablet (4 mg total) by mouth every 8 (eight) hours as needed for nausea or vomiting., Starting Sat 01/22/2018, Print        Note:  This document was prepared using Dragon voice recognition software and may include unintentional dictation errors.  Nanda Quinton, MD Emergency Medicine    Long, Wonda Olds, MD 01/22/18 Kathyrn Drown

## 2018-01-22 NOTE — ED Notes (Signed)
ED Provider at bedside. 

## 2018-01-22 NOTE — ED Triage Notes (Signed)
Pt reports nausea x 4 days. No vomiting but feels like she could. Fatigue with decreased appetite.  No pain.

## 2018-01-22 NOTE — ED Notes (Signed)
Pt given coke at this time.

## 2018-01-24 DIAGNOSIS — R110 Nausea: Secondary | ICD-10-CM | POA: Diagnosis not present

## 2018-01-24 DIAGNOSIS — Z86010 Personal history of colonic polyps: Secondary | ICD-10-CM | POA: Diagnosis not present

## 2018-01-24 DIAGNOSIS — K5900 Constipation, unspecified: Secondary | ICD-10-CM | POA: Diagnosis not present

## 2018-01-24 DIAGNOSIS — R1013 Epigastric pain: Secondary | ICD-10-CM | POA: Diagnosis not present

## 2018-02-18 DIAGNOSIS — M542 Cervicalgia: Secondary | ICD-10-CM | POA: Diagnosis not present

## 2018-02-22 DIAGNOSIS — M5134 Other intervertebral disc degeneration, thoracic region: Secondary | ICD-10-CM | POA: Diagnosis not present

## 2018-02-22 DIAGNOSIS — M9902 Segmental and somatic dysfunction of thoracic region: Secondary | ICD-10-CM | POA: Diagnosis not present

## 2018-02-22 DIAGNOSIS — M9901 Segmental and somatic dysfunction of cervical region: Secondary | ICD-10-CM | POA: Diagnosis not present

## 2018-02-22 DIAGNOSIS — M5032 Other cervical disc degeneration, mid-cervical region, unspecified level: Secondary | ICD-10-CM | POA: Diagnosis not present

## 2018-04-18 DIAGNOSIS — M5134 Other intervertebral disc degeneration, thoracic region: Secondary | ICD-10-CM | POA: Diagnosis not present

## 2018-04-18 DIAGNOSIS — M9902 Segmental and somatic dysfunction of thoracic region: Secondary | ICD-10-CM | POA: Diagnosis not present

## 2018-04-18 DIAGNOSIS — M5032 Other cervical disc degeneration, mid-cervical region, unspecified level: Secondary | ICD-10-CM | POA: Diagnosis not present

## 2018-04-18 DIAGNOSIS — M9901 Segmental and somatic dysfunction of cervical region: Secondary | ICD-10-CM | POA: Diagnosis not present

## 2018-04-27 DIAGNOSIS — M5134 Other intervertebral disc degeneration, thoracic region: Secondary | ICD-10-CM | POA: Diagnosis not present

## 2018-04-27 DIAGNOSIS — M9901 Segmental and somatic dysfunction of cervical region: Secondary | ICD-10-CM | POA: Diagnosis not present

## 2018-04-27 DIAGNOSIS — M5032 Other cervical disc degeneration, mid-cervical region, unspecified level: Secondary | ICD-10-CM | POA: Diagnosis not present

## 2018-04-27 DIAGNOSIS — M9902 Segmental and somatic dysfunction of thoracic region: Secondary | ICD-10-CM | POA: Diagnosis not present

## 2018-05-18 DIAGNOSIS — M542 Cervicalgia: Secondary | ICD-10-CM | POA: Diagnosis not present

## 2018-05-18 DIAGNOSIS — D1801 Hemangioma of skin and subcutaneous tissue: Secondary | ICD-10-CM | POA: Diagnosis not present

## 2018-06-06 DIAGNOSIS — F419 Anxiety disorder, unspecified: Secondary | ICD-10-CM | POA: Diagnosis not present

## 2018-06-06 DIAGNOSIS — F329 Major depressive disorder, single episode, unspecified: Secondary | ICD-10-CM | POA: Diagnosis not present

## 2018-06-06 DIAGNOSIS — I6529 Occlusion and stenosis of unspecified carotid artery: Secondary | ICD-10-CM | POA: Diagnosis not present

## 2018-06-06 DIAGNOSIS — M419 Scoliosis, unspecified: Secondary | ICD-10-CM | POA: Diagnosis not present

## 2018-06-06 DIAGNOSIS — M542 Cervicalgia: Secondary | ICD-10-CM | POA: Diagnosis not present

## 2018-06-06 DIAGNOSIS — G8929 Other chronic pain: Secondary | ICD-10-CM | POA: Diagnosis not present

## 2018-06-06 DIAGNOSIS — Z9181 History of falling: Secondary | ICD-10-CM | POA: Diagnosis not present

## 2018-06-06 DIAGNOSIS — I10 Essential (primary) hypertension: Secondary | ICD-10-CM | POA: Diagnosis not present

## 2018-06-17 DIAGNOSIS — E559 Vitamin D deficiency, unspecified: Secondary | ICD-10-CM | POA: Diagnosis not present

## 2018-06-17 DIAGNOSIS — R110 Nausea: Secondary | ICD-10-CM | POA: Diagnosis not present

## 2018-06-17 DIAGNOSIS — I10 Essential (primary) hypertension: Secondary | ICD-10-CM | POA: Diagnosis not present

## 2018-06-17 DIAGNOSIS — F339 Major depressive disorder, recurrent, unspecified: Secondary | ICD-10-CM | POA: Diagnosis not present

## 2018-06-17 DIAGNOSIS — M545 Low back pain: Secondary | ICD-10-CM | POA: Diagnosis not present

## 2018-06-23 DIAGNOSIS — F329 Major depressive disorder, single episode, unspecified: Secondary | ICD-10-CM | POA: Diagnosis not present

## 2018-06-23 DIAGNOSIS — I10 Essential (primary) hypertension: Secondary | ICD-10-CM | POA: Diagnosis not present

## 2018-06-23 DIAGNOSIS — Z0000 Encounter for general adult medical examination without abnormal findings: Secondary | ICD-10-CM | POA: Diagnosis not present

## 2018-06-23 DIAGNOSIS — E559 Vitamin D deficiency, unspecified: Secondary | ICD-10-CM | POA: Diagnosis not present

## 2018-07-27 DIAGNOSIS — F418 Other specified anxiety disorders: Secondary | ICD-10-CM | POA: Diagnosis not present

## 2018-08-05 DIAGNOSIS — F339 Major depressive disorder, recurrent, unspecified: Secondary | ICD-10-CM | POA: Diagnosis not present

## 2018-08-09 ENCOUNTER — Encounter (INDEPENDENT_AMBULATORY_CARE_PROVIDER_SITE_OTHER): Payer: Self-pay | Admitting: Orthopaedic Surgery

## 2018-08-09 ENCOUNTER — Ambulatory Visit (INDEPENDENT_AMBULATORY_CARE_PROVIDER_SITE_OTHER): Payer: Medicare Other | Admitting: Orthopaedic Surgery

## 2018-08-09 ENCOUNTER — Ambulatory Visit (INDEPENDENT_AMBULATORY_CARE_PROVIDER_SITE_OTHER): Payer: Medicare Other

## 2018-08-09 VITALS — BP 151/84 | HR 86 | Ht 64.0 in | Wt 126.0 lb

## 2018-08-09 DIAGNOSIS — M542 Cervicalgia: Secondary | ICD-10-CM | POA: Diagnosis not present

## 2018-08-09 NOTE — Progress Notes (Signed)
Office Visit Note   Patient: [name] [name]           Date of Birth: Nov 18, 1929           MRN: [id number] Visit Date: 08/09/2018              Requested by: Deland Pretty, MD [address] [place], [place] [address] PCP: Deland Pretty, MD   Assessment & Plan: Visit Diagnoses:  1. Neck pain     Plan: Patient has advanced spondylosis and is developed autofusion at C3-4 and C4-5.  She looks like she is almost fused at C5-6 and has some anterolisthesis at both C6-7 and C7-T1.  No radicular symptoms no myelopathic changes.  We will place her in a soft collar she can increase her Aleve to 2 twice a day for about a week and then cut back down and continue using the Tylenol.  Collar will be used intermittently in office follow-up if she has persistent symptoms.  Follow-Up Instructions: No follow-ups on file.   Orders:  Orders Placed This Encounter  Procedures  . XR Cervical Spine 2 or 3 views   No orders of the defined types were placed in this encounter.     Procedures: No procedures performed   Clinical Data: No additional findings.   Subjective: Chief Complaint  Patient presents with  . Neck - Pain    HPI 84 year old female seen with chronic neck pain times greater than 4 months.  She has pain when she turns her neck.  She been treated with home health physical therapy also a chiropractor.  She states she gets symptom relief for a day or so and then recurrence of symptoms.  She is used some Aleve subtherapeutic dose also plain Tylenol.  No history of neck surgery.  No myelopathic symptoms she is amatory without any walking aids.  She is here with a church friend who states the patient has been like her "second mother"  Review of Systems positive for aortic valve replacement thoracic aneurysm surgery.  Last echo showed good ejection fraction.  Positive for hypertension, depression, anxiety.  Tib-fib fracture in the past.  Positive for cervical spondylosis.  14  point review of systems otherwise negative is a pertains HPI.  No fever chills no bowel bladder symptoms associated.   Objective: Vital Signs: BP (!) 151/84   Pulse 86   Ht 5\' 4"  (1.626 m)   Wt 126 lb (57.2 kg)   LMP  (LMP Unknown)   BMI 21.63 kg/m   Physical Exam  Constitutional: She is oriented to person, place, and time. She appears well-developed.  HENT:  Head: Normocephalic.  Right Ear: External ear normal.  Left Ear: External ear normal.  Eyes: Pupils are equal, round, and reactive to light.  Neck: No tracheal deviation present. No thyromegaly present.  Cardiovascular: Normal rate.  Pulmonary/Chest: Effort normal.  Abdominal: Soft.  Neurological: She is alert and oriented to person, place, and time.  Skin: Skin is warm and dry.  Psychiatric: She has a normal mood and affect. Her behavior is normal.    Ortho Exam healed median sternotomy.  Some brachial plexus tenderness no lymphadenopathy.  50% limitation of cervical range of motion.  She has some pain with extension.  Upper extremity reflexes are 1+ and symmetrical no weakness of biceps triceps wrist flexion-extension.  Specialty Comments:  No specialty comments available.  Imaging: No results found.   PMFS History: Patient Active Problem List   Diagnosis Date Noted  .  Chest pain 08/12/2017  . Dyspnea 08/12/2017  . Acute blood loss anemia 03/06/2013  . Anxiety state 03/06/2013  . Depression 03/06/2013  . Unspecified constipation 03/06/2013  . Fracture tibia/fibula 12/02/2012  . Hypokalemia 12/02/2012  . S/P AVR (aortic valve replacement) and aortoplasty 09/30/2012  . HTN (hypertension) 09/30/2012  . Hypertension   . Hypercholesterolemia   . Paroxysmal atrial fibrillation (HCC)   . Aortic aneurysm, thoracic ([place])   . Aortic insufficiency    Past Medical History:  Diagnosis Date  . Acute blood loss anemia    Postoperative  . Anemia   . Aortic aneurysm, thoracic ([place])   . Aortic insufficiency   .  Cancer ([place])    colon  . Depression with anxiety   . Hypercholesterolemia   . Hypertension   . Paroxysmal atrial fibrillation (HCC)   . S/P AVR (aortic valve replacement) and aortoplasty 09/30/2012  . Thrombocytopenia ([facility])    Postoperative thrombocytopenia    Family History  Problem Relation Age of Onset  . Parkinsonism Mother   . Hypertension Father     Past Surgical History:  Procedure Laterality Date  . AORTIC VALVE REPLACEMENT  03/20/2009   mild aortic stenosis & mod to severe A1, lt verticular wall thickness was normal & lt ventricular function was estimated to be 55 to 60%,no mitral valve regugitation or stenosis  . COLON SURGERY  1973   for colon cancer  . COLONOSCOPY  12/29/2011   Procedure: COLONOSCOPY;  Surgeon: Cleotis Nipper, MD;  Location: [facility] ENDOSCOPY;  Service: Endoscopy;  Laterality: N/A;  . COLONOSCOPY  08/30/2012   Procedure: COLONOSCOPY;  Surgeon: Cleotis Nipper, MD;  Location: [facility] ENDOSCOPY;  Service: Endoscopy;  Laterality: N/A;  . HOT HEMOSTASIS  12/29/2011   Procedure: HOT HEMOSTASIS (ARGON PLASMA COAGULATION/BICAP);  Surgeon: Cleotis Nipper, MD;  Location[facility] ENDOSCOPY;  Service: Endoscopy;  Laterality: N/A;  . HOT HEMOSTASIS  08/30/2012   Procedure: HOT HEMOSTASIS (ARGON PLASMA COAGULATION/BICAP);  Surgeon: Cleotis Nipper, MD;  Location: [facility] ENDOSCOPY;  Service: Endoscopy;  Laterality: N/A;  . ROTATOR CUFF REPAIR     Right Shoulder  . TIBIA IM NAIL INSERTION Left 12/03/2012   Procedure: INTRAMEDULLARY (IM) NAIL TIBIAL;  Surgeon: Mauri Pole, MD;  Location: [facility] ORS;  Service: Orthopedics;  Laterality: Left;   Social History   Occupational History  . Occupation: Youth worker: RETIRED  Tobacco Use  . Smoking status: Never Smoker  . Smokeless tobacco: Never Used  Substance and Sexual Activity  . Alcohol use: No  . Drug use: No  . Sexual activity: Never

## 2018-08-12 DIAGNOSIS — M5030 Other cervical disc degeneration, unspecified cervical region: Secondary | ICD-10-CM | POA: Diagnosis not present

## 2018-08-12 DIAGNOSIS — M2550 Pain in unspecified joint: Secondary | ICD-10-CM | POA: Diagnosis not present

## 2018-08-12 DIAGNOSIS — M4120 Other idiopathic scoliosis, site unspecified: Secondary | ICD-10-CM | POA: Diagnosis not present

## 2018-08-12 DIAGNOSIS — M546 Pain in thoracic spine: Secondary | ICD-10-CM | POA: Diagnosis not present

## 2018-08-18 DIAGNOSIS — G8929 Other chronic pain: Secondary | ICD-10-CM | POA: Diagnosis not present

## 2018-08-18 DIAGNOSIS — I6529 Occlusion and stenosis of unspecified carotid artery: Secondary | ICD-10-CM | POA: Diagnosis not present

## 2018-08-18 DIAGNOSIS — I10 Essential (primary) hypertension: Secondary | ICD-10-CM | POA: Diagnosis not present

## 2018-08-18 DIAGNOSIS — M542 Cervicalgia: Secondary | ICD-10-CM | POA: Diagnosis not present

## 2018-11-08 DIAGNOSIS — F339 Major depressive disorder, recurrent, unspecified: Secondary | ICD-10-CM | POA: Diagnosis not present

## 2018-11-08 DIAGNOSIS — M542 Cervicalgia: Secondary | ICD-10-CM | POA: Diagnosis not present

## 2018-12-06 DIAGNOSIS — H35373 Puckering of macula, bilateral: Secondary | ICD-10-CM | POA: Diagnosis not present

## 2018-12-06 DIAGNOSIS — H353131 Nonexudative age-related macular degeneration, bilateral, early dry stage: Secondary | ICD-10-CM | POA: Diagnosis not present

## 2018-12-12 DIAGNOSIS — M542 Cervicalgia: Secondary | ICD-10-CM | POA: Diagnosis not present

## 2018-12-12 DIAGNOSIS — F339 Major depressive disorder, recurrent, unspecified: Secondary | ICD-10-CM | POA: Diagnosis not present

## 2018-12-23 DIAGNOSIS — M5030 Other cervical disc degeneration, unspecified cervical region: Secondary | ICD-10-CM | POA: Insufficient documentation

## 2019-02-06 DIAGNOSIS — F411 Generalized anxiety disorder: Secondary | ICD-10-CM | POA: Diagnosis not present

## 2019-04-13 DIAGNOSIS — F419 Anxiety disorder, unspecified: Secondary | ICD-10-CM | POA: Diagnosis not present

## 2019-04-13 DIAGNOSIS — L989 Disorder of the skin and subcutaneous tissue, unspecified: Secondary | ICD-10-CM | POA: Diagnosis not present

## 2019-04-17 DIAGNOSIS — F339 Major depressive disorder, recurrent, unspecified: Secondary | ICD-10-CM | POA: Diagnosis not present

## 2019-04-17 DIAGNOSIS — I10 Essential (primary) hypertension: Secondary | ICD-10-CM | POA: Diagnosis not present

## 2019-04-17 DIAGNOSIS — Z79899 Other long term (current) drug therapy: Secondary | ICD-10-CM | POA: Diagnosis not present

## 2019-04-17 DIAGNOSIS — F411 Generalized anxiety disorder: Secondary | ICD-10-CM | POA: Diagnosis not present

## 2019-04-18 DIAGNOSIS — Z79899 Other long term (current) drug therapy: Secondary | ICD-10-CM | POA: Diagnosis not present

## 2019-04-18 DIAGNOSIS — F339 Major depressive disorder, recurrent, unspecified: Secondary | ICD-10-CM | POA: Diagnosis not present

## 2019-04-18 DIAGNOSIS — I10 Essential (primary) hypertension: Secondary | ICD-10-CM | POA: Diagnosis not present

## 2019-04-18 DIAGNOSIS — F411 Generalized anxiety disorder: Secondary | ICD-10-CM | POA: Diagnosis not present

## 2019-04-25 ENCOUNTER — Telehealth: Payer: Self-pay | Admitting: Cardiology

## 2019-04-25 NOTE — Telephone Encounter (Signed)
LVM for patient to scheduled 1 yr followup with Dr. [place] tomorrow.

## 2019-04-26 ENCOUNTER — Telehealth: Payer: Self-pay | Admitting: Cardiology

## 2019-04-26 NOTE — Telephone Encounter (Signed)
04-26-19 Called patient, but, asked for remote access code and could not leave a VM. ST

## 2019-04-27 DIAGNOSIS — I10 Essential (primary) hypertension: Secondary | ICD-10-CM | POA: Diagnosis not present

## 2019-04-27 DIAGNOSIS — F411 Generalized anxiety disorder: Secondary | ICD-10-CM | POA: Diagnosis not present

## 2019-07-04 DIAGNOSIS — F411 Generalized anxiety disorder: Secondary | ICD-10-CM | POA: Diagnosis not present

## 2019-07-04 DIAGNOSIS — I10 Essential (primary) hypertension: Secondary | ICD-10-CM | POA: Diagnosis not present

## 2019-07-04 DIAGNOSIS — R413 Other amnesia: Secondary | ICD-10-CM | POA: Diagnosis not present

## 2019-07-11 DIAGNOSIS — R413 Other amnesia: Secondary | ICD-10-CM | POA: Diagnosis not present

## 2019-07-11 DIAGNOSIS — F411 Generalized anxiety disorder: Secondary | ICD-10-CM | POA: Diagnosis not present

## 2019-07-11 DIAGNOSIS — F339 Major depressive disorder, recurrent, unspecified: Secondary | ICD-10-CM | POA: Diagnosis not present

## 2019-07-11 DIAGNOSIS — I10 Essential (primary) hypertension: Secondary | ICD-10-CM | POA: Diagnosis not present

## 2019-07-19 DIAGNOSIS — R413 Other amnesia: Secondary | ICD-10-CM | POA: Diagnosis not present

## 2019-07-19 DIAGNOSIS — R109 Unspecified abdominal pain: Secondary | ICD-10-CM | POA: Diagnosis not present

## 2019-07-19 DIAGNOSIS — F418 Other specified anxiety disorders: Secondary | ICD-10-CM | POA: Diagnosis not present

## 2019-08-17 ENCOUNTER — Ambulatory Visit: Payer: Medicare Other | Admitting: Neurology

## 2020-01-25 NOTE — Progress Notes (Signed)
Virtual Visit via Telephone Note   This visit type was conducted due to national recommendations for restrictions regarding the COVID-19 Pandemic (e.g. social distancing) in an effort to limit this patient's exposure and mitigate transmission in our community.  Due to her co-morbid illnesses, this patient is at least at moderate risk for complications without adequate follow up.  This format is felt to be most appropriate for this patient at this time.  The patient did not have access to video technology/had technical difficulties with video requiring transitioning to audio format only (telephone).  All issues noted in this document were discussed and addressed.  No physical exam could be performed with this format.  Please refer to the patient's chart for her  consent to telehealth for [facility].   The patient was identified using 2 identifiers.  Date:  01/26/2020   ID:  [name] [name], DOB 1930-01-24, MRN [id number]  Patient Location: Home Provider Location: Home  PCP:  Deland Pretty, MD  Cardiologist:  Eyan Hagood Martinique MD Electrophysiologist:  None   Evaluation Performed:  Follow-Up Visit  Chief Complaint:  Follow up thoracic aneurysm  History of Present Illness:    [name] [name] is a 86 y.o. female with history of valvular heart disease.   She is s/p bioprosthetic aortic valve replacement with aortic root grafting in June of 2010 by Dr. Cyndia Bent for a large thoracic aneurysm and AI. Other issues include HTN,  anxiety/depression. No known CAD per prior coronary CT - she was never cathed prior to her surgery in 2010. Echo in October 2014 and again in November 2018 was unremarkable.   She was last seen 2 years ago. States she is doing well from a cardiac standpoint without chest pain or dyspnea. Has problems with depression and anxiety from living by herself. Really wants to set up a visit in person and to have lab work done.     The patient does not have symptoms concerning for  COVID-19 infection (fever, chills, cough, or new shortness of breath).    Past Medical History:  Diagnosis Date  . Acute blood loss anemia    Postoperative  . Anemia   . Aortic aneurysm, thoracic ([place])   . Aortic insufficiency   . Cancer ([place])    colon  . Depression with anxiety   . Hypercholesterolemia   . Hypertension   . Paroxysmal atrial fibrillation (HCC)   . S/P AVR (aortic valve replacement) and aortoplasty 09/30/2012  . Thrombocytopenia ([place])    Postoperative thrombocytopenia   Past Surgical History:  Procedure Laterality Date  . AORTIC VALVE REPLACEMENT  03/20/2009   mild aortic stenosis & mod to severe A1, lt verticular wall thickness was normal & lt ventricular function was estimated to be 55 to 60%,no mitral valve regugitation or stenosis  . COLON SURGERY  1973   for colon cancer  . COLONOSCOPY  12/29/2011   Procedure: COLONOSCOPY;  Surgeon: Cleotis Nipper, MD;  Location: [facility] ENDOSCOPY;  Service: Endoscopy;  Laterality: N/A;  . COLONOSCOPY  08/30/2012   Procedure: COLONOSCOPY;  Surgeon: Cleotis Nipper, MD;  Location: [facility] ENDOSCOPY;  Service: Endoscopy;  Laterality: N/A;  . HOT HEMOSTASIS  12/29/2011   Procedure: HOT HEMOSTASIS (ARGON PLASMA COAGULATION/BICAP);  Surgeon: Cleotis Nipper, MD;  Location: [facility] ENDOSCOPY;  Service: Endoscopy;  Laterality: N/A;  . HOT HEMOSTASIS  08/30/2012   Procedure: HOT HEMOSTASIS (ARGON PLASMA COAGULATION/BICAP);  Surgeon: Cleotis Nipper, MD;  Location: [facility] ENDOSCOPY;  Service: Endoscopy;  Laterality:  N/A;  . ROTATOR CUFF REPAIR     Right Shoulder  . TIBIA IM NAIL INSERTION Left 12/03/2012   Procedure: INTRAMEDULLARY (IM) NAIL TIBIAL;  Surgeon: Mauri Pole, MD;  Location: [facility] ORS;  Service: Orthopedics;  Laterality: Left;     Current Meds  Medication Sig  . acetaminophen (TYLENOL) 500 MG tablet Take 500 mg by mouth every 6 (six) hours as needed for mild pain, moderate pain, fever or headache.  Marland Kitchen amLODipine (NORVASC) 5 MG tablet  Take 1 tablet by mouth daily.  . irbesartan (AVAPRO) 300 MG tablet Take 1 tablet by mouth daily.  . traMADol (ULTRAM) 50 MG tablet Take by mouth every 6 (six) hours as needed.   . [DISCONTINUED] DiazePAM (VALIUM PO) Take 5 mg by mouth daily.  . [DISCONTINUED] meclizine (ANTIVERT) 25 MG tablet Take 12.5-25 mg by mouth 2 (two) times daily as needed for nausea.     Allergies:   Codeine   Social History   Tobacco Use  . Smoking status: Never Smoker  . Smokeless tobacco: Never Used  Substance Use Topics  . Alcohol use: No  . Drug use: No     Family Hx: The patient's family history includes Hypertension in her father; Parkinsonism in her mother.  ROS:   Please see the history of present illness.    All other systems reviewed and are negative.   Prior CV studies:   The following studies were reviewed today:  Echo 08/19/17: Study Conclusions  - Left ventricle: The cavity size was normal. Wall thickness was increased in a pattern of mild LVH. Systolic function was normal. The estimated ejection fraction was in the range of 60% to 65%. Wall motion was normal; there were no regional wall motion abnormalities. Doppler parameters are consistent with abnormal left ventricular relaxation (grade 1 diastolic dysfunction). - Aortic valve: There was a bioprosthetic aortic valve. There was no stenosis. There was no regurgitation. Mean gradient (S): 8 mm Hg. - Mitral valve: There was trivial regurgitation. - Left atrium: The atrium was mildly dilated. - Right ventricle: The cavity size was normal. Systolic function was normal. - Right atrium: The atrium was mildly dilated. - Tricuspid valve: Peak RV-RA gradient (S): 15 mm Hg. - Pulmonary arteries: PA peak pressure: 18 mm Hg (S). - Inferior vena cava: The vessel was normal in size. The respirophasic diameter changes were in the normal range (>= 50%), consistent with normal central venous pressure.  Impressions:   - Normal LV size with mild LV hypertrophy. EF 60-65%. Normal RV size and systolic function. Bioprosthetic aortic valve with normal function.   Labs/Other Tests and Data Reviewed:    EKG:  No ECG reviewed.  Recent Labs: No results found for requested labs within last 8760 hours.   Recent Lipid Panel Lab Results  Component Value Date/Time   CHOL 196 07/18/2013 10:57 AM   TRIG 78.0 07/18/2013 10:57 AM   HDL 62.90 07/18/2013 10:57 AM   CHOLHDL 3 07/18/2013 10:57 AM   LDLCALC 118 (H) 07/18/2013 10:57 AM   Labs dated 06/17/18: cholesterol 181, triglycerides 86, HDL 71. Hgb 11.8. BMET normal   Wt Readings from Last 3 Encounters:  08/09/18 126 lb (57.2 kg)  01/22/18 125 lb (56.7 kg)  11/26/17 128 lb 6.4 oz (58.2 kg)     Objective:    Vital Signs:  Ht 5\' 4"  (1.626 m)   LMP  (LMP Unknown)   BMI 21.63 kg/m    VITAL SIGNS:  reviewed  ASSESSMENT &  PLAN:    1. S/p aortic valve and aortic root using a 21-mm Medtronic Freestyle aortic root heart valve prosthesis with reimplantation of the coronary arteries, resection and grafting of ascending aortic aneurysm using a 28-mm Hemashield tube graft 2010. Echo in November 2018 showed good prosthetic valve function. She denies any current symptoms.  2. HTN  On irbesartan and amlodipine. Doesn't monitor BP at home. Will arrange for an in office visit to assess and check lab work.   3.  PACs asymptomatic.  COVID-19 Education: The signs and symptoms of COVID-19 were discussed with the patient and how to seek care for testing (follow up with PCP or arrange E-visit).  The importance of social distancing was discussed today.  Time:   Today, I have spent 10 minutes with the patient with telehealth technology discussing the above problems.     Medication Adjustments/Labs and Tests Ordered: Current medicines are reviewed at length with the patient today.  Concerns regarding medicines are outlined above.   Tests Ordered: No orders of  the defined types were placed in this encounter.   Medication Changes: No orders of the defined types were placed in this encounter.   Follow Up:  In Person in 4 week(s)  Signed, Christle Nolting Martinique, MD  01/26/2020 9:37 AM    [place]

## 2020-01-26 ENCOUNTER — Telehealth (INDEPENDENT_AMBULATORY_CARE_PROVIDER_SITE_OTHER): Payer: Medicare Other | Admitting: Cardiology

## 2020-01-26 ENCOUNTER — Telehealth: Payer: Self-pay

## 2020-01-26 ENCOUNTER — Encounter: Payer: Self-pay | Admitting: Cardiology

## 2020-01-26 VITALS — Ht 64.0 in

## 2020-01-26 DIAGNOSIS — I10 Essential (primary) hypertension: Secondary | ICD-10-CM

## 2020-01-26 DIAGNOSIS — Z952 Presence of prosthetic heart valve: Secondary | ICD-10-CM

## 2020-01-26 NOTE — Telephone Encounter (Signed)
  Patient Consent for Virtual Visit         [name] [name] has provided verbal consent on 01/26/2020 for a virtual visit (video or telephone).   CONSENT FOR VIRTUAL VISIT FOR:  [name] [name]  By participating in this virtual visit I agree to the following:  I hereby voluntarily request, consent and authorize [facility] and its employed or contracted physicians, physician assistants, nurse practitioners or other licensed health care professionals (the Practitioner), to provide me with telemedicine health care services (the "Services") as deemed necessary by the treating Practitioner. I acknowledge and consent to receive the Services by the Practitioner via telemedicine. I understand that the telemedicine visit will involve communicating with the Practitioner through live audiovisual communication technology and the disclosure of certain medical information by electronic transmission. I acknowledge that I have been given the opportunity to request an in-person assessment or other available alternative prior to the telemedicine visit and am voluntarily participating in the telemedicine visit.  I understand that I have the right to withhold or withdraw my consent to the use of telemedicine in the course of my care at any time, without affecting my right to future care or treatment, and that the Practitioner or I may terminate the telemedicine visit at any time. I understand that I have the right to inspect all information obtained and/or recorded in the course of the telemedicine visit and may receive copies of available information for a reasonable fee.  I understand that some of the potential risks of receiving the Services via telemedicine include:  Marland Kitchen Delay or interruption in medical evaluation due to technological equipment failure or disruption; . Information transmitted may not be sufficient (e.g. poor resolution of images) to allow for appropriate medical decision making by the  Practitioner; and/or  . In rare instances, security protocols could fail, causing a breach of personal health information.  Furthermore, I acknowledge that it is my responsibility to provide information about my medical history, conditions and care that is complete and accurate to the best of my ability. I acknowledge that Practitioner's advice, recommendations, and/or decision may be based on factors not within their control, such as incomplete or inaccurate data provided by me or distortions of diagnostic images or specimens that may result from electronic transmissions. I understand that the practice of medicine is not an exact science and that Practitioner makes no warranties or guarantees regarding treatment outcomes. I acknowledge that a copy of this consent can be made available to me via my patient portal ([facility]), or I can request a printed copy by calling the office of [facility].    I understand that my insurance will be billed for this visit.   I have read or had this consent read to me. . I understand the contents of this consent, which adequately explains the benefits and risks of the Services being provided via telemedicine.  . I have been provided ample opportunity to ask questions regarding this consent and the Services and have had my questions answered to my satisfaction. . I give my informed consent for the services to be provided through the use of telemedicine in my medical care

## 2020-01-30 ENCOUNTER — Telehealth: Payer: Self-pay | Admitting: Licensed Clinical Social Worker

## 2020-01-30 NOTE — Telephone Encounter (Signed)
CSW referred to assess patient for additional needs as she is no longer driving. CSW contacted patient who states that she lives alone and manages well independently. She has friends in the area that assist with transport to MD appointments. She denies any concerns with obtaining food from the grocery store as her friends assist with errands as well. Patient denies any needs at this time. CSW provided contact information in the event that patient needs assistance in the future. Patient grateful for the support and call. [name] [name], [place], [place]

## 2020-02-01 ENCOUNTER — Other Ambulatory Visit: Payer: Self-pay | Admitting: Internal Medicine

## 2020-02-01 DIAGNOSIS — I6529 Occlusion and stenosis of unspecified carotid artery: Secondary | ICD-10-CM | POA: Diagnosis not present

## 2020-02-01 DIAGNOSIS — I10 Essential (primary) hypertension: Secondary | ICD-10-CM | POA: Diagnosis not present

## 2020-02-01 DIAGNOSIS — M810 Age-related osteoporosis without current pathological fracture: Secondary | ICD-10-CM | POA: Diagnosis not present

## 2020-02-01 DIAGNOSIS — R413 Other amnesia: Secondary | ICD-10-CM | POA: Diagnosis not present

## 2020-02-01 DIAGNOSIS — I6523 Occlusion and stenosis of bilateral carotid arteries: Secondary | ICD-10-CM

## 2020-02-01 DIAGNOSIS — Z0000 Encounter for general adult medical examination without abnormal findings: Secondary | ICD-10-CM | POA: Diagnosis not present

## 2020-02-01 DIAGNOSIS — N390 Urinary tract infection, site not specified: Secondary | ICD-10-CM | POA: Diagnosis not present

## 2020-02-05 ENCOUNTER — Ambulatory Visit
Admission: RE | Admit: 2020-02-05 | Discharge: 2020-02-05 | Disposition: A | Discharge status: Home / Self Care | Payer: Medicare Other | Source: Ambulatory Visit | Attending: Internal Medicine | Admitting: Internal Medicine

## 2020-02-05 DIAGNOSIS — I6523 Occlusion and stenosis of bilateral carotid arteries: Secondary | ICD-10-CM

## 2020-02-06 ENCOUNTER — Other Ambulatory Visit: Payer: Self-pay

## 2020-02-06 ENCOUNTER — Ambulatory Visit: Payer: Medicare Other | Admitting: Sports Medicine

## 2020-02-06 ENCOUNTER — Encounter: Payer: Self-pay | Admitting: Sports Medicine

## 2020-02-06 VITALS — BP 148/77 | HR 77 | Temp 97.3°F | Resp 16

## 2020-02-06 DIAGNOSIS — B351 Tinea unguium: Secondary | ICD-10-CM | POA: Diagnosis not present

## 2020-02-06 DIAGNOSIS — L84 Corns and callosities: Secondary | ICD-10-CM

## 2020-02-06 DIAGNOSIS — I739 Peripheral vascular disease, unspecified: Secondary | ICD-10-CM

## 2020-02-06 DIAGNOSIS — M79675 Pain in left toe(s): Secondary | ICD-10-CM

## 2020-02-06 DIAGNOSIS — M79674 Pain in right toe(s): Secondary | ICD-10-CM | POA: Diagnosis not present

## 2020-02-06 NOTE — Progress Notes (Signed)
Subjective: [name] [name] is a 86 y.o. female patient seen today in office with complaint of mildly painful callus and thickened and elongated toenails; unable to trim. Patient denies history of Diabetes, Neuropathy, or Vascular disease but has issues with veins and swelling sometimes in legs. Reports that bunions hurt especially right can not go without shoes and has thick skin to area. Patient has no other pedal complaints at this time.   Patient assisted by daughter this visit.   Review of Systems  All other systems reviewed and are negative.   Patient Active Problem List   Diagnosis Date Noted  . DDD (degenerative disc disease), cervical 12/23/2018  . Chest pain 08/12/2017  . Dyspnea 08/12/2017  . Acute blood loss anemia 03/06/2013  . Anxiety state 03/06/2013  . Depression 03/06/2013  . Unspecified constipation 03/06/2013  . Fracture tibia/fibula 12/02/2012  . Hypokalemia 12/02/2012  . S/P AVR (aortic valve replacement) and aortoplasty 09/30/2012  . HTN (hypertension) 09/30/2012  . Hypertension   . Hypercholesterolemia   . Paroxysmal atrial fibrillation (HCC)   . Aortic aneurysm, thoracic ([place])   . Aortic insufficiency     Current Outpatient Medications on File Prior to Visit  Medication Sig Dispense Refill  . acetaminophen (TYLENOL) 500 MG tablet Take 500 mg by mouth every 6 (six) hours as needed for mild pain, moderate pain, fever or headache.    Marland Kitchen amLODipine (NORVASC) 5 MG tablet Take 1 tablet by mouth daily.  0  . escitalopram (LEXAPRO) 10 MG tablet escitalopram 10 mg tablet  TAKE 1 & 1 2 (ONE & ONE HALF) TABLETS BY MOUTH ONCE DAILY FOR 90 DAYS    . irbesartan (AVAPRO) 300 MG tablet Take 1 tablet by mouth daily.  0  . traMADol (ULTRAM) 50 MG tablet Take by mouth every 6 (six) hours as needed.   0   No current facility-administered medications on file prior to visit.    Allergies  Allergen Reactions  . Codeine Nausea And Vomiting    Objective: Physical  Exam  General: Well developed, nourished, no acute distress, awake, alert and oriented x 3  Vascular: Dorsalis pedis artery 1/4 bilateral, Posterior tibial artery 0/4 bilateral, skin temperature warm to warm proximal to distal bilateral lower extremities, moderate varicosities,no pedal hair present bilateral.  Neurological: Gross sensation present via light touch bilateral.   Dermatological: Skin is warm, dry, and supple bilateral, Nails 1-10 are tender, severely elongated, thick, and discolored with mild subungal debris, no webspace macerations present bilateral, no open lesions present bilateral, + callus/corns/hyperkeratotic tissue present sub met 1 bilateral. No signs of infection bilateral.  Musculoskeletal: Asymptomatic bunion boney deformities noted bilateral. Muscular strength within normal limits without painon range of motion. No pain with calf compression bilateral.  Assessment and Plan:  Problem List Items Addressed This Visit    None    Visit Diagnoses    Pain due to onychomycosis of toenails of both feet    -  Primary   Callus of foot       PVD (peripheral vascular disease) ([place])          -Examined patient.  -Discussed treatment options for painful mycotic nails. -Mechanically debrided callus sub met bilateral using sterile chisel blade and reduced mycotic nails with sterile nail nipper and dremel nail file without incident. (nails were severely elongated and took an extensive amount of time to soak and trim) -Dispensed tube foam for bunions  -Patient to return in 2.5 months for follow up evaluation or  sooner if symptoms worsen.  Landis Martins, DPM

## 2020-02-08 ENCOUNTER — Telehealth: Payer: Self-pay | Admitting: Cardiology

## 2020-02-08 NOTE — Telephone Encounter (Signed)
[name] [name] is calling stating [name] [name] just had labs and an EKG performed at Dr. Pennie Banter Office her PCP. She would like to know if [name] [name]'s appointment scheduled for 02/13/20 is still necessary since she has already had these performed or if the results can be sent over and used by Dr. Martinique instead of having them redone at the appointment. Please advise.

## 2020-02-08 NOTE — Telephone Encounter (Signed)
LM since it is pts home number .Marland Kitchen [place] is not listed on the [facility] but in [employer] as the pts friend.

## 2020-02-09 ENCOUNTER — Telehealth: Payer: Self-pay | Admitting: Cardiology

## 2020-02-09 NOTE — Progress Notes (Deleted)
[name] [name] Date of Birth: May 26, 1930 Medical Record [id number]  History of Present Illness: [name] [name] is seen for follow up valvular heart disease.   She is s/p bioprosthetic aortic valve replacement with aortic root grafting in June of 2010 by Dr. Cyndia Bent for a large thoracic aneurysm and AI. Other issues include HTN,  anxiety/depression. No known CAD per prior coronary CT - she was never cathed prior to her surgery in 2010. Echo in October 2014 and again in November 2018 was unremarkable.  On follow up today she is doing very well. No chest pain or dyspnea. No palpitations or edema. Notes varicose veins in her legs.  No prior history of CVA, TIA, or bleeding.   Current Outpatient Medications  Medication Sig Dispense Refill  . acetaminophen (TYLENOL) 500 MG tablet Take 500 mg by mouth every 6 (six) hours as needed for mild pain, moderate pain, fever or headache.    Marland Kitchen amLODipine (NORVASC) 5 MG tablet Take 1 tablet by mouth daily.  0  . escitalopram (LEXAPRO) 10 MG tablet escitalopram 10 mg tablet  TAKE 1 & 1 2 (ONE & ONE HALF) TABLETS BY MOUTH ONCE DAILY FOR 90 DAYS    . irbesartan (AVAPRO) 300 MG tablet Take 1 tablet by mouth daily.  0  . traMADol (ULTRAM) 50 MG tablet Take by mouth every 6 (six) hours as needed.   0   No current facility-administered medications for this visit.    Allergies  Allergen Reactions  . Codeine Nausea And Vomiting    Past Medical History:  Diagnosis Date  . Acute blood loss anemia    Postoperative  . Anemia   . Aortic aneurysm, thoracic ([place])   . Aortic insufficiency   . Cancer ([place])    colon  . Depression with anxiety   . Hypercholesterolemia   . Hypertension   . Paroxysmal atrial fibrillation (HCC)   . S/P AVR (aortic valve replacement) and aortoplasty 09/30/2012  . Thrombocytopenia ([place])    Postoperative thrombocytopenia    Past Surgical History:  Procedure Laterality Date  . AORTIC VALVE REPLACEMENT  03/20/2009   mild  aortic stenosis & mod to severe A1, lt verticular wall thickness was normal & lt ventricular function was estimated to be 55 to 60%,no mitral valve regugitation or stenosis  . COLON SURGERY  1973   for colon cancer  . COLONOSCOPY  12/29/2011   Procedure: COLONOSCOPY;  Surgeon: Cleotis Nipper, MD;  Location: [facility] ENDOSCOPY;  Service: Endoscopy;  Laterality: N/A;  . COLONOSCOPY  08/30/2012   Procedure: COLONOSCOPY;  Surgeon: Cleotis Nipper, MD;  Location: [facility] ENDOSCOPY;  Service: Endoscopy;  Laterality: N/A;  . HOT HEMOSTASIS  12/29/2011   Procedure: HOT HEMOSTASIS (ARGON PLASMA COAGULATION/BICAP);  Surgeon: Cleotis Nipper, MD;  Location: [facility] ENDOSCOPY;  Service: Endoscopy;  Laterality: N/A;  . HOT HEMOSTASIS  08/30/2012   Procedure: HOT HEMOSTASIS (ARGON PLASMA COAGULATION/BICAP);  Surgeon: Cleotis Nipper, MD;  Location: [facility] ENDOSCOPY;  Service: Endoscopy;  Laterality: N/A;  . ROTATOR CUFF REPAIR     Right Shoulder  . TIBIA IM NAIL INSERTION Left 12/03/2012   Procedure: INTRAMEDULLARY (IM) NAIL TIBIAL;  Surgeon: Mauri Pole, MD;  Location: [facility] ORS;  Service: Orthopedics;  Laterality: Left;    Social History   Tobacco Use  Smoking Status Never Smoker  Smokeless Tobacco Never Used    Social History   Substance and Sexual Activity  Alcohol Use No    Family History  Problem Relation Age of Onset  . Parkinsonism Mother   . Hypertension Father     Review of Systems: The review of systems is per the HPI.  All other systems were reviewed and are negative.  Physical Exam: LMP  (LMP Unknown)  GENERAL:  Well appearing HEENT:  PERRL, EOMI, sclera are clear. Oropharynx is clear. NECK:  No jugular venous distention, carotid upstroke brisk and symmetric, no bruits, no thyromegaly or adenopathy LUNGS:  Clear to auscultation bilaterally CHEST:  Unremarkable HEART:  RRR,  PMI not displaced or sustained,S1 and S2 within normal limits, no S3, no S4: no clicks, no rubs, soft SEM RUSB.   ABD:  Soft, nontender. BS +, no masses or bruits. No hepatomegaly, no splenomegaly EXT:  2 + pulses throughout, no edema, no cyanosis no clubbing SKIN:  Warm and dry.  No rashes. Varicose veins. NEURO:  Alert and oriented x 3. Cranial nerves II through XII intact. PSYCH:  Cognitively intact      LABORATORY DATA:    Lab Results  Component Value Date   WBC 4.3 01/22/2018   HGB 11.5 (L) 01/22/2018   HCT 34.5 (L) 01/22/2018   PLT 199 01/22/2018   GLUCOSE 116 (H) 01/22/2018   CHOL 196 07/18/2013   TRIG 78.0 07/18/2013   HDL 62.90 07/18/2013   LDLCALC 118 (H) 07/18/2013   ALT 17 01/22/2018   AST 28 01/22/2018   NA 133 (L) 01/22/2018   K 3.4 (L) 01/22/2018   CL 102 01/22/2018   CREATININE 0.83 01/22/2018   BUN 17 01/22/2018   CO2 21 (L) 01/22/2018   TSH 1.71 07/18/2013   INR 1.7 (H) 03/20/2009   HGBA1C  03/18/2009    5.8 (NOTE) The ADA recommends the following therapeutic goal for glycemic control related to Hgb A1c measurement: Goal of therapy: <6.5 Hgb A1c  Reference: American Diabetes Association: Clinical Practice Recommendations 2010, Diabetes Care, 2010, 33: (Suppl  1).   Labs dated 03/30/16: cholesterol 178, triglyderides 73, HDL 66, LDL 97.  Dated 06/10/17: cholesterol 193, triglycerides 49. HDL 77, LDL 106. Dated 07/27/17: normal CBC and CMET.  Dated 02/01/20: cholesterol 179, triglycerides 64, HDL 69, LDL 93.   Echo 08/19/17: Study Conclusions  - Left ventricle: The cavity size was normal. Wall thickness was   increased in a pattern of mild LVH. Systolic function was normal.   The estimated ejection fraction was in the range of 60% to 65%.   Wall motion was normal; there were no regional wall motion   abnormalities. Doppler parameters are consistent with abnormal   left ventricular relaxation (grade 1 diastolic dysfunction). - Aortic valve: There was a bioprosthetic aortic valve. There was   no stenosis. There was no regurgitation. Mean gradient (S): 8 mm    Hg. - Mitral valve: There was trivial regurgitation. - Left atrium: The atrium was mildly dilated. - Right ventricle: The cavity size was normal. Systolic function   was normal. - Right atrium: The atrium was mildly dilated. - Tricuspid valve: Peak RV-RA gradient (S): 15 mm Hg. - Pulmonary arteries: PA peak pressure: 18 mm Hg (S). - Inferior vena cava: The vessel was normal in size. The   respirophasic diameter changes were in the normal range (>= 50%),   consistent with normal central venous pressure.  Impressions:  - Normal LV size with mild LV hypertrophy. EF 60-65%. Normal RV   size and systolic function. Bioprosthetic aortic valve with   normal function.   Assessment / Plan: 1.S/p aortic  valve and aortic root using a 21-mm Medtronic Freestyle aortic root heart valve prosthesis with reimplantation of the coronary arteries, resection and grafting of ascending aortic aneurysm using a 28-mm Hemashield tube graft 2010. Echo in November 2018 showed good prosthetic valve function.   2. HTN under good control  3.  PACs asymptomatic.

## 2020-02-09 NOTE — Telephone Encounter (Signed)
[name] [name] is calling and is wanting cancel the pts appt because she is in [place] and will not be able to bring her. She is wondering if Dr Martinique received the labs and ekg from her pcp    Please call

## 2020-02-09 NOTE — Telephone Encounter (Signed)
Called and spoke with pt, notified that her friend Richmond Campbell was calling us to cancel her appt because [place] was going to be out of town and could not take her.  Pt did not seem to be aware that her friend was wanting her appt canceled. Reminded pt her appt is 02/13/20 at 11:00 am. Pt stated she should be able to find a ride there since she does not drive. Notified that if she is unable to to let us know and we may be able to make her appt a virtual visit. Pt verbalized understanding and was thankful for the call. Notified that we had not received her labs/ekg from her pcp yet but we would be able to call her PCP and have them faxed if needed. Pt verbalized understanding with no other questions or concerns at this time.

## 2020-02-13 ENCOUNTER — Ambulatory Visit: Payer: Medicare Other | Admitting: Cardiology

## 2020-02-16 NOTE — Telephone Encounter (Signed)
Duplicate. This matter has been addressed.

## 2020-02-18 ENCOUNTER — Telehealth: Payer: Self-pay | Admitting: Physician Assistant

## 2020-02-18 NOTE — Telephone Encounter (Signed)
Called to discuss the homebound vaccination initiative with the patient and/or caregiver.   She needs to think about it some more and would like [place] to call back in a week or so.  Angelena Form PA-C  [facility]

## 2020-02-18 NOTE — Telephone Encounter (Signed)
Called to discuss the homebound vaccination initiative with the patient and/or caregiver.   Patient interested but would like [place] to call her back when she is more awake.  Angelena Form PA-C  [facility]

## 2020-02-19 ENCOUNTER — Ambulatory Visit: Payer: Medicare Other | Admitting: Podiatry

## 2020-02-27 ENCOUNTER — Telehealth: Payer: Self-pay | Admitting: Physician Assistant

## 2020-02-27 NOTE — Telephone Encounter (Signed)
I connected by phone with [name] [name] and/or patient's caregiver on 02/27/2020 at 4:03 PM to discuss the potential vaccination through our Homebound vaccination initiative.   Prevaccination Checklist for COVID-19 Vaccines  1.  Are you feeling sick today? yes  2.  Have you ever received a dose of a COVID-19 vaccine?  no      If yes, which one? None   3.  Have you ever had an allergic reaction: (This would include a severe reaction [ e.g., anaphylaxis] that required treatment with epinephrine or EpiPen or that caused you to go to the hospital.  It would also include an allergic reaction that occurred within 4 hours that caused hives, swelling, or respiratory distress, including wheezing.) A.  A previous dose of COVID-19 vaccine. no  B.  A vaccine or injectable therapy that contains multiple components, one of which is a COVID-19 vaccine component, but it is not known which component elicited the immediate reaction. no  C.  Are you allergic to polyethylene glycol? no   4.  Have you ever had an allergic reaction to another vaccine (other than COVID-19 vaccine) or an injectable medication? (This would include a severe reaction [ e.g., anaphylaxis] that required treatment with epinephrine or EpiPen or that caused you to go to the hospital.  It would also include an allergic reaction that occurred within 4 hours that caused hives, swelling, or respiratory distress, including wheezing.)  no   5.  Have you ever had a severe allergic reaction (e.g., anaphylaxis) to something other than a component of the COVID-19 vaccine, or any vaccine or injectable medication?  This would include food, pet, venom, environmental, or oral medication allergies.  no   6.  Have you received any vaccine in the last 14 days? no   7.  Have you ever had a positive test for COVID-19 or has a doctor ever told you that you had COVID-19?  no   8.  Have you received passive antibody therapy (monoclonal antibodies or convalescent  serum) as a treatment for COVID-19? no   9.  Do you have a weakened immune system caused by something such as HIV infection or cancer or do you take immunosuppressive drugs or therapies?  no   10.  Do you have a bleeding disorder or are you taking a blood thinner? no   11.  Are you pregnant or breast-feeding? no   12.  Do you have dermal fillers? no   __________________   This patient is a 86 y.o. female that meets the FDA criteria to receive homebound vaccination. Patient or parent/caregiver understands they have the option to accept or refuse homebound vaccination.  Patient passed the pre-screening checklist and would like to proceed with homebound vaccination.  Based on questionnaire above, I recommend the patient be observed for 15 minutes.  There are a no other household members/caregivers who are also interested in receiving the vaccine.   I will send the patient's information to our scheduling team who will reach out to schedule the patient and potential caregiver/family members for homebound vaccination.    Angelena Form PA-C 02/27/2020 4:03 PM

## 2020-03-12 ENCOUNTER — Ambulatory Visit: Payer: Medicare Other | Attending: Critical Care Medicine

## 2020-03-12 DIAGNOSIS — Z23 Encounter for immunization: Secondary | ICD-10-CM

## 2020-03-12 NOTE — Progress Notes (Signed)
   Covid-19 Vaccination Clinic  Name:  [name] [name]    MRN: [id number] DOB: 09/22/1930  03/12/2020  [name] was observed post Covid-19 immunization for 15 minutes without incident. She was provided with Vaccine Information Sheet and instruction to access the V-Safe system.   [name] was instructed to call 911 with any severe reactions post vaccine: Marland Kitchen Difficulty breathing  . Swelling of face and throat  . A fast heartbeat  . A bad rash all over body  . Dizziness and weakness   Immunizations Administered    Name Date Dose VIS Date Route   Moderna COVID-19 Vaccine 03/12/2020 11:38 AM 0.5 mL 09/2019 Intramuscular   Manufacturer: Moderna   Lot: [id number]   [place][id number]

## 2020-03-19 DIAGNOSIS — F339 Major depressive disorder, recurrent, unspecified: Secondary | ICD-10-CM | POA: Diagnosis not present

## 2020-03-19 DIAGNOSIS — I10 Essential (primary) hypertension: Secondary | ICD-10-CM | POA: Diagnosis not present

## 2020-03-20 DIAGNOSIS — F418 Other specified anxiety disorders: Secondary | ICD-10-CM | POA: Diagnosis not present

## 2020-03-21 ENCOUNTER — Ambulatory Visit: Payer: Medicare Other | Admitting: Neurology

## 2020-03-21 ENCOUNTER — Encounter: Payer: Self-pay | Admitting: Neurology

## 2020-03-21 VITALS — BP 116/70 | HR 79 | Ht 64.0 in | Wt 127.0 lb

## 2020-03-21 DIAGNOSIS — F02818 Dementia in other diseases classified elsewhere, unspecified severity, with other behavioral disturbance: Secondary | ICD-10-CM

## 2020-03-21 DIAGNOSIS — I351 Nonrheumatic aortic (valve) insufficiency: Secondary | ICD-10-CM

## 2020-03-21 DIAGNOSIS — F0151 Vascular dementia with behavioral disturbance: Secondary | ICD-10-CM

## 2020-03-21 DIAGNOSIS — I711 Thoracic aortic aneurysm, ruptured, unspecified: Secondary | ICD-10-CM

## 2020-03-21 DIAGNOSIS — F01518 Vascular dementia, unspecified severity, with other behavioral disturbance: Secondary | ICD-10-CM

## 2020-03-21 DIAGNOSIS — Z952 Presence of prosthetic heart valve: Secondary | ICD-10-CM

## 2020-03-21 DIAGNOSIS — I480 Paroxysmal atrial fibrillation: Secondary | ICD-10-CM | POA: Diagnosis not present

## 2020-03-21 DIAGNOSIS — F0281 Dementia in other diseases classified elsewhere with behavioral disturbance: Secondary | ICD-10-CM

## 2020-03-21 DIAGNOSIS — G301 Alzheimer's disease with late onset: Secondary | ICD-10-CM

## 2020-03-21 NOTE — Progress Notes (Addendum)
Provider:  Larey Seat, M D  Referring Provider: Deland Pretty, MD Primary Care Physician:  Deland Pretty, MD  Chief Complaint  Patient presents with  . New Patient (Initial Visit)    Rm 11 with family friend [name] [name]- Here to discuss decrease in memory and worsening right hand numbness.      HPI:  [name] [name] is a 20 year- young female of caucasian ethnicity and seen on 03-21-2020 upon  referral from Dr. Shelia Media for 2 issues, memory loss ( known to have short term memory loss over the last 18-12 month ) and right hand numbness.   [name] [name] is accompanied today by her cousins spouse...  Her family has noted a decline in short-term memory maybe about the last 24 months.  There have been incidences where she forgot that they already went shopping for certain staples and she would want to go again getting the same items she already purchased. She reports sometimes entering her room and is not sure why she is or why she went there she sometimes searches for the names of people that are very familiar with her. About a year ago her family took over the balancing of the checkbook and financial affairs.she loses the dog leash, the purse, the keys.  There has been an increased need for increased need for assistance with active increased need for assistance with activities of daily living.she lives alone, has a dog and cats, and has no living relatives nearby. She lives alone in a home, has a reversed mortgage.  It is evident to her family that the patient had progressed over the last 12 months. She loves at the same address for 40 years, abut she has been unable to recognize the home from the outside or inside, about the last 6-9 month she loses money but reports people stole it. . She feels as if "people stay there overnight" and " this does feel different than my home'. Imposter syndrome - she stated many times there is another [place], [name] [name] or whoever is her visitor, and when  [name] [name] orders groceries to be delivered to [name] [name]'s home but when she calls and asks her about the deliveries , the patient is unbale to recall.   Review of Systems: Out of a complete 14 system review, the patient complains of only the following symptoms, and all other reviewed systems are negative.   Memory loss, THIS IS advanced dementia and not new onset- at least 2 years since onset.     Social History   Socioeconomic History  . Marital status: Widowed    Spouse name: Not on file  . Number of children: 0  . Years of education: Not on file  . Highest education level: Not on file  Occupational History  . Occupation: Youth worker: RETIRED  Tobacco Use  . Smoking status: Never Smoker  . Smokeless tobacco: Never Used  Substance and Sexual Activity  . Alcohol use: No  . Drug use: No  . Sexual activity: Never  Other Topics Concern  . Not on file  Social History Narrative  . Not on file   Social Determinants of Health   Financial Resource Strain:   . Difficulty of Paying Living Expenses:   Food Insecurity: No Food Insecurity  . Worried About Charity fundraiser in the Last Year: Never true  . Ran Out of Food in the Last Year: Never true  Transportation Needs: No  Transportation Needs  . Lack of Transportation (Medical): No  . Lack of Transportation (Non-Medical): No  Physical Activity:   . Days of Exercise per Week:   . Minutes of Exercise per Session:   Stress:   . Feeling of Stress :   Social Connections:   . Frequency of Communication with Friends and Family:   . Frequency of Social Gatherings with Friends and Family:   . Attends Religious Services:   . Active Member of Clubs or Organizations:   . Attends Archivist Meetings:   Marland Kitchen Marital Status:   Intimate Partner Violence:   . Fear of Current or Ex-Partner:   . Emotionally Abused:   Marland Kitchen Physically Abused:   . Sexually Abused:     Family History  Problem Relation Age of Onset   . Parkinsonism Mother   . Hypertension Father     Past Medical History:  Diagnosis Date  . Acute blood loss anemia    Postoperative  . Anemia   . Aortic aneurysm, thoracic ([place])   . Aortic insufficiency   . Cancer ([place])    colon  . Depression with anxiety   . Hypercholesterolemia   . Hypertension   . Paroxysmal atrial fibrillation ([facility])   . S/P AVR (aortic valve replacement) and aortoplasty 09/30/2012  . Thrombocytopenia ([place])    Postoperative thrombocytopenia    Past Surgical History:  Procedure Laterality Date  . AORTIC VALVE REPLACEMENT  03/20/2009   mild aortic stenosis & mod to severe A1, lt verticular wall thickness was normal & lt ventricular function was estimated to be 55 to 60%,no mitral valve regugitation or stenosis  . COLON SURGERY  1973   for colon cancer  . COLONOSCOPY  12/29/2011   Procedure: COLONOSCOPY;  Surgeon: Cleotis Nipper, MD;  Location: [facility] ENDOSCOPY;  Service: Endoscopy;  Laterality: N/A;  . COLONOSCOPY  08/30/2012   Procedure: COLONOSCOPY;  Surgeon: Cleotis Nipper, MD;  Location: [facility] ENDOSCOPY;  Service: Endoscopy;  Laterality: N/A;  . HOT HEMOSTASIS  12/29/2011   Procedure: HOT HEMOSTASIS (ARGON PLASMA COAGULATION/BICAP);  Surgeon: Cleotis Nipper, MD;  Location: [facility] ENDOSCOPY;  Service: Endoscopy;  Laterality: N/A;  . HOT HEMOSTASIS  08/30/2012   Procedure: HOT HEMOSTASIS (ARGON PLASMA COAGULATION/BICAP);  Surgeon: Cleotis Nipper, MD;  Location: [facility] ENDOSCOPY;  Service: Endoscopy;  Laterality: N/A;  . ROTATOR CUFF REPAIR     Right Shoulder  . TIBIA IM NAIL INSERTION Left 12/03/2012   Procedure: INTRAMEDULLARY (IM) NAIL TIBIAL;  Surgeon: Mauri Pole, MD;  Location: [facility] ORS;  Service: Orthopedics;  Laterality: Left;    Current Outpatient Medications  Medication Sig Dispense Refill  . acetaminophen (TYLENOL) 500 MG tablet Take 500 mg by mouth every 6 (six) hours as needed for mild pain, moderate pain, fever or headache.    Marland Kitchen amLODipine  (NORVASC) 5 MG tablet Take 1 tablet by mouth daily.  0  . escitalopram (LEXAPRO) 10 MG tablet escitalopram 10 mg tablet  TAKE 1 & 1 2 (ONE & ONE HALF) TABLETS BY MOUTH ONCE DAILY FOR 90 DAYS    . traMADol (ULTRAM) 50 MG tablet Take by mouth every 6 (six) hours as needed.   0  . irbesartan (AVAPRO) 300 MG tablet Take 1 tablet by mouth daily. (Patient not taking: Reported on 03/21/2020)  0   No current facility-administered medications for this visit.    Allergies as of 03/21/2020 - Review Complete 03/21/2020  Allergen Reaction Noted  . Codeine Nausea And Vomiting  03/05/2011    Vitals: BP 116/70   Pulse 79   Ht 5\' 4"  (1.626 m)   Wt 127 lb (57.6 kg)   LMP  (LMP Unknown)   SpO2 95%   BMI 21.80 kg/m  Last Weight:  Wt Readings from Last 1 Encounters:  03/21/20 127 lb (57.6 kg)   Last Height:   Ht Readings from Last 1 Encounters:  03/21/20 5\' 4"  (1.626 m)    Physical exam:  General: The patient is awake, alert and appears not in acute distress. The patient is well groomed. Head: Normocephalic, atraumatic. Neck is supple. Cardiovascular:  Regular rate and rhythm with a murmur, Respiratory: Lungs are clear to auscultation. Skin:  Without evidence of edema, or rash Trunk: BMI is* 21.8  Neurologic exam : The patient is awake and alert, but not oriented to place and time.  Memory subjective  described as ' forgetful"  There is a normal attention span & concentration ability. She is pleasant and cooperative - but forgets quickly what was asked of her.    mini-mental status exam:  MMSE - Mini Mental State Exam 03/21/2020  Orientation to time 1  Orientation to Place 3  Registration 3  Attention/ Calculation 1  Recall 0  Language- name 2 objects 2  Language- repeat 0  Language- follow 3 step command 3  Language- read & follow direction 1  Write a sentence 1  Copy design 0  Total score 15     Speech is fluent without  dysarthria, dysphonia but with  aphasia. Mood and affect are  appropriate.  Cranial nerves: Pupils are equal and briskly reactive to light.  Funduscopic exam with  evidence of cataract surgery . Extraocular movements  in vertical and horizontal planes not smooth -  Visual fields by finger perimetry are intact. Hearing to finger rub intact.   Facial sensation intact to fine touch.  Facial motor strength is symmetric and tongue and uvula move midline. Tongue protrusion into either cheek is normal. Shoulder shrug is normal.  Motor exam:   Elevated tone , shrunken muscle bulk and symmetric low  strength in all extremities. Very arthritic hands.  Sensory:  Fine touch, pinprick and vibration were tested in all extremities, reportedly  normal. Coordination: Rapid alternating movements in the fingers/hands were normal.  Finger-to-nose maneuver  normal without evidence of ataxia, dysmetria or tremor. Gait and station: Patient walks without assistive device and is able unassisted to climb up to the exam table. Strength within normal limits. Stance is stable and normal.  Deep tendon reflexes: in the  upper and lower extremities are symmetric and intact.   Assessment:  60 minutes visit - After physical and neurologic examination, review of laboratory studies, imaging, neurophysiology testing and pre-existing records, assessment is that of :  Progressed , late in life onset dementia- this is not an early stage and the history is suggestive of at least 7 years of symptoms.     [name] [name] has developed quite significant short-term memory memory loss but also some disorientation, and and sometimes some paranoid ideas of her house having been invaded or her money has been stolen.  She has not been placed on any of the memory support memory medications also her vital signs indicate that she could do to well with Namenda or Aricept.  I strongly urged to get a social work consult to arrange for a power of attorney, financial and medical.  A Mini-Mental status  examination of 15 out of 30 points  indicates a progressed dementia,  Most likely of the Alzheimer's type. Contributing are visual acuity loss, arthritis ROM limitations. There is a possible de differential Dx of vascular dementia , given heart valve disease and atrial fibrillation history.       PCP already ordered an MRI brain for her.   She is already seen by Dr. Nelva Bush for spinal arthritis  / DDD , and that's where her hand and arm numbness can be followed.     Plan:  Treatment plan and additional workup :  PCP needs to address social issues through social work - this patient is not save to continue to live alone without a designated caregiver visit daily.   I will not need another imaging study than the one already ordered by Dr Shelia Media.  I cannot find any labs or recent results. Not in EPIC.  CBC with differential was normal, TSH and B12 were ordered a lipid panel was also ordered but I do not see the results in the referral papers.  UA was negative.  Asencion Partridge Cristan Scherzer MD 03/21/2020

## 2020-03-21 NOTE — Patient Instructions (Signed)
Alzheimer Disease Caregiver Guide  Alzheimer disease causes a person to lose the ability to remember things and make decisions. A person who has Alzheimer disease may not be able to take care of himself or herself. He or she may need help with simple tasks. The tips below can help you care for the person. What kind of changes does this condition cause? This condition makes a person:  Forget things.  Feel confused.  Act differently.  Have different moods. These things get worse with time. Tips to help with symptoms  Be calm and patient.  Respond with a simple, short answer.  Avoid correcting the person in a negative way.  Try not to take things personally, even if the person forgets your name.  Do not argue with the person. This may make the person more upset. Tips to lessen frustration  Make appointments and do daily tasks when the person is at his or her best.  Take your time. Simple tasks may take longer. Allow plenty of time to complete tasks.  Limit choices for the person.  Involve the person in what you are doing.  Keep a daily routine.  Avoid new or crowded places, if possible.  Use simple words, short sentences, and a calm voice. Only give one direction at a time.  Buy clothes and shoes that are easy to put on and take off.  Organize medicines in a pillbox for each day of the week.  Keep a calendar in a central location to remind the person of meetings or other activities.  Let people help if they offer. Take a break when needed. Tips to prevent injury  Keep floors clear. Remove rugs, magazine racks, and floor lamps.  Keep hallways well-lit.  Put a handrail and non-slip mat in the bathtub or shower.  Put childproof locks on cabinets that have dangerous items in them. These items include medicine, alcohol, guns, toxic cleaning items, sharp tools, matches, and lighters.  Put locks on doors where the person cannot see or reach them. This helps the person  to not wander out of the house and get lost.  Be prepared for emergencies. Keep a list of emergency phone numbers and addresses close by.  Bracelets may be worn that track location and identify the person as having memory problems. This should be worn at all times for safety. Tips for the future  Discuss financial and legal planning early. People with this disease have trouble managing their money as the disease gets worse. Get help from a professional.  Talk about advance directives, safety, and daily care. Take these steps: ? Create a living will and choose a power of attorney. This is someone who can make decisions for the person with Alzheimer disease when he or she can no longer do so. ? Discuss driving safety and when to stop driving. The person's doctor can help with this. ? If the person lives alone, make sure he or she is safe. Some people need extra help at home. Other people need more care at a nursing home or care center. Where to find support You can find support by joining a support group near you. Some benefits of joining a support group include:  Learning ways to manage stress.  Sharing experiences with others.  Getting emotional comfort and support.  Learning about caregiving as the disease progresses.  Knowing what community resources are available and making use of them. Where to find more information  Alzheimer's Association: [place] Contact a doctor if:  The person has a fever.  The person has a sudden behavior change that does not get better with calming strategies.  The person is not able to take care of himself or herself at home.  The person threatens you or anyone else, including himself or herself.  You are no longer able to care for the person. Summary  Alzheimer disease causes a person to forget things and to be confused.  A person who has this condition may not be able to take care of himself or herself.  Take steps to keep the  person from getting hurt. Plan for future care.  You can find support by joining a support group near you. This information is not intended to replace advice given to you by your health care provider. Make sure you discuss any questions you have with your health care provider. Document Revised: 01/17/2019 Document Reviewed: 09/23/2017 Elsevier Patient Education  2020 Elsevier Inc.  

## 2020-03-22 ENCOUNTER — Ambulatory Visit
Admission: RE | Admit: 2020-03-22 | Discharge: 2020-03-22 | Disposition: A | Discharge status: Home / Self Care | Payer: Medicare Other | Source: Ambulatory Visit | Attending: Internal Medicine | Admitting: Internal Medicine

## 2020-03-22 DIAGNOSIS — R413 Other amnesia: Secondary | ICD-10-CM | POA: Diagnosis not present

## 2020-03-22 NOTE — Progress Notes (Signed)
No Hyponatremia, hypokalemia . Excellent CMET

## 2020-03-25 ENCOUNTER — Telehealth: Payer: Self-pay

## 2020-03-25 DIAGNOSIS — I10 Essential (primary) hypertension: Secondary | ICD-10-CM | POA: Diagnosis not present

## 2020-03-25 DIAGNOSIS — F411 Generalized anxiety disorder: Secondary | ICD-10-CM | POA: Diagnosis not present

## 2020-03-25 NOTE — Telephone Encounter (Signed)
Pt's family friend Richmond Campbell has been notified of result. Advised we would call once the other test came back.

## 2020-03-25 NOTE — Telephone Encounter (Signed)
-----   Message from Larey Seat, MD sent at 03/22/2020 11:20 AM EDT ----- Normal metabolic panel, pending apolipo

## 2020-03-26 LAB — COMPREHENSIVE METABOLIC PANEL
ALT: 13 IU/L (ref 0–32)
AST: 23 IU/L (ref 0–40)
Albumin/Globulin Ratio: 1.6 (ref 1.2–2.2)
Albumin: 4.4 g/dL (ref 3.6–4.6)
Alkaline Phosphatase: 80 IU/L (ref 48–121)
BUN/Creatinine Ratio: 28 (ref 12–28)
BUN: 25 mg/dL (ref 8–27)
Bilirubin Total: 0.2 mg/dL (ref 0.0–1.2)
CO2: 24 mmol/L (ref 20–29)
Calcium: 9.1 mg/dL (ref 8.7–10.3)
Chloride: 103 mmol/L (ref 96–106)
Creatinine, Ser: 0.89 mg/dL (ref 0.57–1.00)
GFR calc Af Amer: 67 mL/min/{1.73_m2} (ref >59)
GFR calc non Af Amer: 58 mL/min/{1.73_m2} — ABNORMAL LOW (ref >59)
Globulin, Total: 2.7 g/dL (ref 1.5–4.5)
Glucose: 90 mg/dL (ref 65–99)
Potassium: 4.3 mmol/L (ref 3.5–5.2)
Sodium: 139 mmol/L (ref 134–144)
Total Protein: 7.1 g/dL (ref 6.0–8.5)

## 2020-03-26 LAB — APOE ALZHEIMER'S RISK

## 2020-03-26 LAB — TSH: TSH: 1.37 u[IU]/mL (ref 0.450–4.500)

## 2020-03-27 NOTE — Progress Notes (Signed)
[name] [name]'s apo-lipoprotein panel is not suggestive of a genetic disposition to late onset Alzheimer's Disease.  Excellent metabolic panel - leaves diagnosis of dementia of other origin and non-familial Alzheimer's disease .

## 2020-03-29 ENCOUNTER — Telehealth: Payer: Self-pay | Admitting: Neurology

## 2020-03-29 NOTE — Telephone Encounter (Signed)
Called the caretaker on the [facility]. There was no answer. LVM informing that the lab work looked good. Advised nothing concerning and the genetic testing that was completed was negative in indicating that this was genetic. Advised to call when office is open with any questions they may have.

## 2020-03-29 NOTE — Telephone Encounter (Signed)
-----   Message from Larey Seat, MD sent at 03/27/2020 12:59 PM EDT ----- Cyra's apo-lipoprotein panel is not suggestive of a genetic disposition to late onset Alzheimer's Disease.  Excellent metabolic panel - leaves diagnosis of dementia of other origin and non-familial Alzheimer's disease .

## 2020-04-02 ENCOUNTER — Telehealth: Payer: Self-pay | Admitting: Neurology

## 2020-04-02 MED ORDER — DONEPEZIL HCL 5 MG PO TABS: 5.0000 mg | ORAL_TABLET | Freq: Every day | ORAL | 0 refills | Status: DC

## 2020-04-02 NOTE — Telephone Encounter (Signed)
Pt's caretaker Jacqlyn Larsen on [place] called wanting to know if the pt can be given medications to slow down the dementia. Please advise.

## 2020-04-02 NOTE — Telephone Encounter (Signed)
Called the caretaker Richmond Campbell (not Jacqlyn Larsen) advised that Dr Brett Fairy would start the Aricept 5 mg dose. Advised that she could have SE such as diarrhea. Advised to call our office and leave a message with phone staff if pt is tolerating the medication. If the patient is tolerating the medication we will send a refill in for the patient and increase to 10 mg at that time. Pt's caretaker verbalized understanding.

## 2020-04-09 ENCOUNTER — Ambulatory Visit: Payer: Medicare Other | Attending: Critical Care Medicine

## 2020-04-09 DIAGNOSIS — Z23 Encounter for immunization: Secondary | ICD-10-CM

## 2020-04-09 NOTE — Progress Notes (Signed)
   Covid-19 Vaccination Clinic  Name:  NEAVEH BELANGER    MRN: [id number] DOB: 08/19/1930  04/09/2020  Ms. Crosley was observed post Covid-19 immunization for 15 minutes without incident. She was provided with Vaccine Information Sheet and instruction to access the V-Safe system.   Ms. Holsclaw was instructed to call 911 with any severe reactions post vaccine: Marland Kitchen Difficulty breathing  . Swelling of face and throat  . A fast heartbeat  . A bad rash all over body  . Dizziness and weakness   Immunizations Administered    Name Date Dose VIS Date Route   Moderna COVID-19 Vaccine 04/09/2020 12:15 PM 0.5 mL 09/2019 Intramuscular   Manufacturer: Moderna   Lot: [id number]   [place]: [id number]

## 2020-04-23 ENCOUNTER — Ambulatory Visit: Payer: Medicare Other | Admitting: Sports Medicine

## 2020-06-13 ENCOUNTER — Other Ambulatory Visit: Payer: Self-pay | Admitting: Neurology

## 2020-07-23 ENCOUNTER — Ambulatory Visit: Payer: Medicare Other | Admitting: Adult Health

## 2020-08-13 ENCOUNTER — Ambulatory Visit: Payer: Medicare Other

## 2020-10-08 ENCOUNTER — Ambulatory Visit: Payer: Medicare Other

## 2020-10-22 ENCOUNTER — Ambulatory Visit: Payer: Medicare Other | Attending: Critical Care Medicine

## 2020-10-22 DIAGNOSIS — Z23 Encounter for immunization: Secondary | ICD-10-CM

## 2020-10-22 NOTE — Progress Notes (Signed)
   Covid-19 Vaccination Clinic  Name:  [name] [name]    MRN: [id number] DOB: 08-29-1930  10/22/2020  [name] was observed post Covid-19 immunization for 15 minutes without incident. She was provided with Vaccine Information Sheet and instruction to access the V-Safe system.   [name] was instructed to call 911 with any severe reactions post vaccine: Marland Kitchen Difficulty breathing  . Swelling of face and throat  . A fast heartbeat  . A bad rash all over body  . Dizziness and weakness   Immunizations Administered    Name Date Dose VIS Date Route   Moderna Covid-19 Booster Vaccine 10/22/2020 10:15 AM 0.25 mL 07/31/2020 Intramuscular   Manufacturer: Moderna   Lot: [id number]   [place]: [id number]

## 2021-01-12 ENCOUNTER — Emergency Department (HOSPITAL_COMMUNITY): Payer: Medicare Other

## 2021-01-12 ENCOUNTER — Emergency Department (HOSPITAL_BASED_OUTPATIENT_CLINIC_OR_DEPARTMENT_OTHER): Payer: Medicare Other

## 2021-01-12 ENCOUNTER — Observation Stay (HOSPITAL_COMMUNITY)
Admission: EM | Admit: 2021-01-12 | Discharge: 2021-01-16 | Disposition: A | Discharge status: Home / Self Care | Payer: Medicare Other | Attending: Family Medicine | Admitting: Family Medicine

## 2021-01-12 ENCOUNTER — Encounter (HOSPITAL_COMMUNITY): Payer: Self-pay

## 2021-01-12 ENCOUNTER — Other Ambulatory Visit: Payer: Self-pay

## 2021-01-12 DIAGNOSIS — R45851 Suicidal ideations: Secondary | ICD-10-CM | POA: Insufficient documentation

## 2021-01-12 DIAGNOSIS — I82452 Acute embolism and thrombosis of left peroneal vein: Secondary | ICD-10-CM | POA: Diagnosis not present

## 2021-01-12 DIAGNOSIS — E876 Hypokalemia: Secondary | ICD-10-CM | POA: Diagnosis not present

## 2021-01-12 DIAGNOSIS — Z85038 Personal history of other malignant neoplasm of large intestine: Secondary | ICD-10-CM | POA: Diagnosis not present

## 2021-01-12 DIAGNOSIS — L0390 Cellulitis, unspecified: Secondary | ICD-10-CM

## 2021-01-12 DIAGNOSIS — D529 Folate deficiency anemia, unspecified: Secondary | ICD-10-CM | POA: Insufficient documentation

## 2021-01-12 DIAGNOSIS — Z23 Encounter for immunization: Secondary | ICD-10-CM | POA: Insufficient documentation

## 2021-01-12 DIAGNOSIS — F419 Anxiety disorder, unspecified: Secondary | ICD-10-CM | POA: Insufficient documentation

## 2021-01-12 DIAGNOSIS — R262 Difficulty in walking, not elsewhere classified: Secondary | ICD-10-CM | POA: Diagnosis not present

## 2021-01-12 DIAGNOSIS — R5381 Other malaise: Secondary | ICD-10-CM | POA: Insufficient documentation

## 2021-01-12 DIAGNOSIS — R7989 Other specified abnormal findings of blood chemistry: Secondary | ICD-10-CM | POA: Insufficient documentation

## 2021-01-12 DIAGNOSIS — E7800 Pure hypercholesterolemia, unspecified: Secondary | ICD-10-CM | POA: Diagnosis not present

## 2021-01-12 DIAGNOSIS — F0391 Unspecified dementia with behavioral disturbance: Secondary | ICD-10-CM | POA: Diagnosis not present

## 2021-01-12 DIAGNOSIS — M7989 Other specified soft tissue disorders: Secondary | ICD-10-CM | POA: Diagnosis not present

## 2021-01-12 DIAGNOSIS — Z20822 Contact with and (suspected) exposure to covid-19: Secondary | ICD-10-CM | POA: Diagnosis not present

## 2021-01-12 DIAGNOSIS — I10 Essential (primary) hypertension: Secondary | ICD-10-CM | POA: Insufficient documentation

## 2021-01-12 DIAGNOSIS — K645 Perianal venous thrombosis: Secondary | ICD-10-CM | POA: Diagnosis not present

## 2021-01-12 DIAGNOSIS — Z885 Allergy status to narcotic agent status: Secondary | ICD-10-CM | POA: Diagnosis not present

## 2021-01-12 DIAGNOSIS — Z8249 Family history of ischemic heart disease and other diseases of the circulatory system: Secondary | ICD-10-CM | POA: Diagnosis not present

## 2021-01-12 DIAGNOSIS — I824Y2 Acute embolism and thrombosis of unspecified deep veins of left proximal lower extremity: Secondary | ICD-10-CM | POA: Diagnosis not present

## 2021-01-12 DIAGNOSIS — E871 Hypo-osmolality and hyponatremia: Secondary | ICD-10-CM | POA: Diagnosis not present

## 2021-01-12 DIAGNOSIS — I480 Paroxysmal atrial fibrillation: Secondary | ICD-10-CM | POA: Insufficient documentation

## 2021-01-12 DIAGNOSIS — F32A Depression, unspecified: Secondary | ICD-10-CM | POA: Insufficient documentation

## 2021-01-12 DIAGNOSIS — M79606 Pain in leg, unspecified: Secondary | ICD-10-CM | POA: Diagnosis not present

## 2021-01-12 DIAGNOSIS — Z79899 Other long term (current) drug therapy: Secondary | ICD-10-CM | POA: Insufficient documentation

## 2021-01-12 DIAGNOSIS — F0390 Unspecified dementia without behavioral disturbance: Secondary | ICD-10-CM

## 2021-01-12 DIAGNOSIS — L538 Other specified erythematous conditions: Secondary | ICD-10-CM

## 2021-01-12 DIAGNOSIS — L03116 Cellulitis of left lower limb: Secondary | ICD-10-CM | POA: Diagnosis not present

## 2021-01-12 DIAGNOSIS — I959 Hypotension, unspecified: Secondary | ICD-10-CM | POA: Diagnosis not present

## 2021-01-12 DIAGNOSIS — L03115 Cellulitis of right lower limb: Secondary | ICD-10-CM

## 2021-01-12 DIAGNOSIS — Q845 Enlarged and hypertrophic nails: Secondary | ICD-10-CM | POA: Insufficient documentation

## 2021-01-12 DIAGNOSIS — I82409 Acute embolism and thrombosis of unspecified deep veins of unspecified lower extremity: Secondary | ICD-10-CM | POA: Diagnosis present

## 2021-01-12 DIAGNOSIS — I700 Atherosclerosis of aorta: Secondary | ICD-10-CM | POA: Diagnosis not present

## 2021-01-12 DIAGNOSIS — R609 Edema, unspecified: Secondary | ICD-10-CM | POA: Diagnosis not present

## 2021-01-12 DIAGNOSIS — R918 Other nonspecific abnormal finding of lung field: Secondary | ICD-10-CM | POA: Diagnosis not present

## 2021-01-12 LAB — CBC WITH DIFFERENTIAL/PLATELET
Abs Immature Granulocytes: 0.03 10*3/uL (ref 0.00–0.07)
Basophils Absolute: 0 10*3/uL (ref 0.0–0.1)
Basophils Relative: 0 %
Eosinophils Absolute: 0 10*3/uL (ref 0.0–0.5)
Eosinophils Relative: 0 %
HCT: 34.4 % — ABNORMAL LOW (ref 36.0–46.0)
Hemoglobin: 11.2 g/dL — ABNORMAL LOW (ref 12.0–15.0)
Immature Granulocytes: 0 %
Lymphocytes Relative: 14 %
Lymphs Abs: 1.3 10*3/uL (ref 0.7–4.0)
MCH: 28.7 pg (ref 26.0–34.0)
MCHC: 32.6 g/dL (ref 30.0–36.0)
MCV: 88.2 fL (ref 80.0–100.0)
Monocytes Absolute: 0.9 10*3/uL (ref 0.1–1.0)
Monocytes Relative: 10 %
Neutro Abs: 6.9 10*3/uL (ref 1.7–7.7)
Neutrophils Relative %: 76 %
Platelets: 208 10*3/uL (ref 150–400)
RBC: 3.9 MIL/uL (ref 3.87–5.11)
RDW: 15.9 % — ABNORMAL HIGH (ref 11.5–15.5)
WBC: 9.1 10*3/uL (ref 4.0–10.5)
nRBC: 0 % (ref 0.0–0.2)

## 2021-01-12 LAB — BASIC METABOLIC PANEL
Anion gap: 8 (ref 5–15)
BUN: 21 mg/dL (ref 8–23)
CO2: 25 mmol/L (ref 22–32)
Calcium: 8 mg/dL — ABNORMAL LOW (ref 8.9–10.3)
Chloride: 101 mmol/L (ref 98–111)
Creatinine, Ser: 0.7 mg/dL (ref 0.44–1.00)
GFR, Estimated: >60 mL/min (ref >60)
Glucose, Bld: 87 mg/dL (ref 70–99)
Potassium: 4 mmol/L (ref 3.5–5.1)
Sodium: 134 mmol/L — ABNORMAL LOW (ref 135–145)

## 2021-01-12 LAB — BRAIN NATRIURETIC PEPTIDE: B Natriuretic Peptide: 288.2 pg/mL — ABNORMAL HIGH (ref 0.0–100.0)

## 2021-01-12 MED ORDER — HALOPERIDOL LACTATE 5 MG/ML IJ SOLN
INTRAMUSCULAR | Status: AC
  Administered 2021-01-12: 5 mg via INTRAMUSCULAR
  Filled 2021-01-12: qty 1

## 2021-01-12 MED ORDER — APIXABAN 5 MG PO TABS
10.0000 mg | ORAL_TABLET | Freq: Two times a day (BID) | ORAL | Status: DC
  Administered 2021-01-12 – 2021-01-16 (×8): 10 mg via ORAL
  Filled 2021-01-12 (×8): qty 2

## 2021-01-12 MED ORDER — DOXYCYCLINE HYCLATE 100 MG PO TABS
100.0000 mg | ORAL_TABLET | Freq: Once | ORAL | Status: AC
  Administered 2021-01-12: 100 mg via ORAL
  Filled 2021-01-12: qty 1

## 2021-01-12 MED ORDER — RIVAROXABAN 20 MG PO TABS: 20.0000 mg | ORAL_TABLET | Freq: Every day | ORAL | Status: DC

## 2021-01-12 MED ORDER — ACETAMINOPHEN 650 MG RE SUPP: 650.0000 mg | Freq: Four times a day (QID) | RECTAL | Status: DC | PRN

## 2021-01-12 MED ORDER — RIVAROXABAN 15 MG PO TABS: 15.0000 mg | ORAL_TABLET | Freq: Two times a day (BID) | ORAL | Status: DC

## 2021-01-12 MED ORDER — DOXYCYCLINE HYCLATE 100 MG PO TABS
100.0000 mg | ORAL_TABLET | Freq: Two times a day (BID) | ORAL | Status: DC
  Administered 2021-01-13 – 2021-01-16 (×7): 100 mg via ORAL
  Filled 2021-01-12 (×8): qty 1

## 2021-01-12 MED ORDER — HALOPERIDOL LACTATE 5 MG/ML IJ SOLN: 5.0000 mg | Freq: Once | INTRAMUSCULAR | Status: DC

## 2021-01-12 MED ORDER — TETANUS-DIPHTH-ACELL PERTUSSIS 5-2.5-18.5 LF-MCG/0.5 IM SUSY
0.5000 mL | PREFILLED_SYRINGE | Freq: Once | INTRAMUSCULAR | Status: AC
  Administered 2021-01-12: 0.5 mL via INTRAMUSCULAR
  Filled 2021-01-12: qty 0.5

## 2021-01-12 MED ORDER — FUROSEMIDE 10 MG/ML IJ SOLN
20.0000 mg | Freq: Every day | INTRAMUSCULAR | Status: DC
  Administered 2021-01-12 – 2021-01-14 (×3): 20 mg via INTRAVENOUS
  Filled 2021-01-12: qty 2
  Filled 2021-01-12: qty 4
  Filled 2021-01-12: qty 2

## 2021-01-12 MED ORDER — ACETAMINOPHEN 325 MG PO TABS
650.0000 mg | ORAL_TABLET | Freq: Four times a day (QID) | ORAL | Status: DC | PRN
  Administered 2021-01-16: 650 mg via ORAL
  Filled 2021-01-12: qty 2

## 2021-01-12 MED ORDER — APIXABAN 5 MG PO TABS: 5.0000 mg | ORAL_TABLET | Freq: Two times a day (BID) | ORAL | Status: DC

## 2021-01-12 NOTE — H&P (Signed)
History and Physical    [name] [name] DXA:[id number] DOB: 1929-11-23 DOA: 01/12/2021  PCP: Deland Pretty, MD Patient coming from: Home  Chief Complaint: Bilateral lower extremity redness and swelling  HPI: [name] [name] is a 87 y.o. female with medical history significant of dementia, anemia, thoracic aortic aneurysm, aortic insufficiency status post AVR and aortoplasty in 2013, history of colon cancer status post surgery in 1973, depression, anxiety, hypertension, hyperlipidemia, paroxysmal A. Fib presented to the ED via EMS for evaluation of bilateral lower extremity redness and swelling.  EMS reported that patient has dementia and lives alone with cats.  Neighbor noticed redness on her legs and reported that she was scratched by her cat and they were worried that she may have an infection.  In the ED, vital signs stable.  Labs showing WBC 9.1, hemoglobin 11.2 (stable), platelet count 208K.  Sodium 134, potassium 4.0, chloride 101, bicarb 25, BUN 21, creatinine 0.7, glucose 87, calcium 8.0.  BNP 288.  Chest x-ray showing mild left basilar linear scarring and/or atelectasis; no pulmonary edema. Bilateral lower extremity Dopplers were done and revealed acute DVT involving one of the paired veins of the left posterior tibial veins and left peroneal veins. Patient was given doxycycline.  Screening Covid test pending.  Patient is oriented to person and place only.  She is not sure why she is at the hospital.  Endorsing slight discomfort in both of her bilateral lower extremities.  She has no other complaints.  Denies fevers, cough, shortness of breath, chest pain.  History provided mostly by her neighbor Mr. Mikki Santee Feldis who was present at bedside.  Neighbor is concerned about the patient's living conditions.  States she has dementia and lives by herself with several feral cats.  States she used to previously have a dog who died from starvation.  He is concerned that she has put locks all around her  house which makes it very difficult for people to enter her house to check on her.  States police has been called multiple times and social services has also been called to her house.  Neighbor is concerned that her house is very dirty and there is cat feces everywhere.  States she does not have any children, spouse, or close family members except a friend who lives in [place] and checks on her periodically.  This friend is married to her cousin.  Neighbor states today patient's friend from [place] called him and when he went to check on her he became concerned about infection in her legs.  Neighbor is also requesting to keep her on suicide watch as she has threatened to kill herself multiple times by stating she will shoot herself or use a blade to cut herself.  States she lives all by herself but thinks that there are other people in the house such as her mother.  Review of Systems:  All systems reviewed and apart from history of presenting illness, are negative.  Past Medical History:  Diagnosis Date  . Acute blood loss anemia    Postoperative  . Anemia   . Aortic aneurysm, thoracic ([place])   . Aortic insufficiency   . Cancer ([place])    colon  . Depression with anxiety   . Hypercholesterolemia   . Hypertension   . Paroxysmal atrial fibrillation (HCC)   . S/P AVR (aortic valve replacement) and aortoplasty 09/30/2012  . Thrombocytopenia ([place])    Postoperative thrombocytopenia    Past Surgical History:  Procedure Laterality Date  . AORTIC  VALVE REPLACEMENT  03/20/2009   mild aortic stenosis & mod to severe A1, lt verticular wall thickness was normal & lt ventricular function was estimated to be 55 to 60%,no mitral valve regugitation or stenosis  . COLON SURGERY  1973   for colon cancer  . COLONOSCOPY  12/29/2011   Procedure: COLONOSCOPY;  Surgeon: Cleotis Nipper, MD;  Location: [facility] ENDOSCOPY;  Service: Endoscopy;  Laterality: N/A;  . COLONOSCOPY  08/30/2012   Procedure: COLONOSCOPY;   Surgeon: Cleotis Nipper, MD;  Location: [facility] ENDOSCOPY;  Service: Endoscopy;  Laterality: N/A;  . HOT HEMOSTASIS  12/29/2011   Procedure: HOT HEMOSTASIS (ARGON PLASMA COAGULATION/BICAP);  Surgeon: Cleotis Nipper, MD;  Location: [facility] ENDOSCOPY;  Service: Endoscopy;  Laterality: N/A;  . HOT HEMOSTASIS  08/30/2012   Procedure: HOT HEMOSTASIS (ARGON PLASMA COAGULATION/BICAP);  Surgeon: Cleotis Nipper, MD;  Location: [facility] ENDOSCOPY;  Service: Endoscopy;  Laterality: N/A;  . ROTATOR CUFF REPAIR     Right Shoulder  . TIBIA IM NAIL INSERTION Left 12/03/2012   Procedure: INTRAMEDULLARY (IM) NAIL TIBIAL;  Surgeon: Mauri Pole, MD;  Location: [facility] ORS;  Service: Orthopedics;  Laterality: Left;     reports that she has never smoked. She has never used smokeless tobacco. She reports that she does not drink alcohol and does not use drugs.  Allergies  Allergen Reactions  . Codeine Nausea And Vomiting    Family History  Problem Relation Age of Onset  . Parkinsonism Mother   . Hypertension Father     Prior to Admission medications   Medication Sig Start Date End Date Taking? Authorizing Provider  acetaminophen (TYLENOL) 500 MG tablet Take 500 mg by mouth every 6 (six) hours as needed for mild pain, moderate pain, fever or headache.    [provider]  amLODipine (NORVASC) 5 MG tablet Take 1 tablet by mouth daily. 11/12/15   [provider]  donepezil (ARICEPT) 10 MG tablet Take 1 tablet (10 mg total) by mouth at bedtime. 06/13/20   Dohmeier, Asencion Partridge, MD  escitalopram (LEXAPRO) 10 MG tablet escitalopram 10 mg tablet  TAKE 1 & 1 2 (ONE & ONE HALF) TABLETS BY MOUTH ONCE DAILY FOR 90 DAYS    [provider]  irbesartan (AVAPRO) 300 MG tablet Take 1 tablet by mouth daily. Patient not taking: Reported on 03/21/2020 11/12/15   [provider]  traMADol (ULTRAM) 50 MG tablet Take by mouth every 6 (six) hours as needed.  10/19/16   [provider]    Physical  Exam: Vitals:   01/12/21 1645 01/12/21 1700 01/12/21 1900 01/12/21 1930  BP: (!) 148/73 139/73 (!) 151/69 (!) 152/84  Pulse: 71 68 64 64  Resp: 14 18 19  (!) 4  Temp:      TempSrc:      SpO2: 96% 97% 97% 97%    Physical Exam Constitutional:      General: She is not in acute distress. HENT:     Head: Normocephalic and atraumatic.  Eyes:     Extraocular Movements: Extraocular movements intact.     Conjunctiva/sclera: Conjunctivae normal.  Cardiovascular:     Rate and Rhythm: Normal rate and regular rhythm.     Pulses: Normal pulses.  Pulmonary:     Effort: Pulmonary effort is normal. No respiratory distress.     Breath sounds: Normal breath sounds. No wheezing or rales.  Abdominal:     General: Bowel sounds are normal. There is no distension.     Palpations:  Abdomen is soft.     Tenderness: There is no abdominal tenderness.  Musculoskeletal:     Cervical back: Normal range of motion and neck supple.     Right lower leg: Edema present.     Left lower leg: Edema present.     Comments: +4 pitting edema bilateral lower legs  Skin:    General: Skin is warm and dry.     Comments: Erythema and warmth to touch of bilateral lower legs Elongated toenails with signs of onychomycosis  Neurological:     Mental Status: She is alert.     Comments: Oriented to person and place only     Labs on Admission: I have personally reviewed following labs and imaging studies  CBC: Recent Labs  Lab 01/12/21 1727  WBC 9.1  NEUTROABS 6.9  HGB 11.2*  HCT 34.4*  MCV 88.2  PLT 458   Basic Metabolic Panel: Recent Labs  Lab 01/12/21 1727  NA 134*  K 4.0  CL 101  CO2 25  GLUCOSE 87  BUN 21  CREATININE 0.70  CALCIUM 8.0*   GFR: CrCl cannot be calculated (Unknown ideal weight.). Liver Function Tests: No results for input(s): AST, ALT, ALKPHOS, BILITOT, PROT, ALBUMIN in the last 168 hours. No results for input(s): LIPASE, AMYLASE in the last 168 hours. No results for input(s):  AMMONIA in the last 168 hours. Coagulation Profile: No results for input(s): INR, PROTIME in the last 168 hours. Cardiac Enzymes: No results for input(s): CKTOTAL, CKMB, CKMBINDEX, TROPONINI in the last 168 hours. BNP (last 3 results) No results for input(s): PROBNP in the last 8760 hours. HbA1C: No results for input(s): HGBA1C in the last 72 hours. CBG: No results for input(s): GLUCAP in the last 168 hours. Lipid Profile: No results for input(s): CHOL, HDL, LDLCALC, TRIG, CHOLHDL, LDLDIRECT in the last 72 hours. Thyroid Function Tests: No results for input(s): TSH, T4TOTAL, FREET4, T3FREE, THYROIDAB in the last 72 hours. Anemia Panel: No results for input(s): VITAMINB12, FOLATE, FERRITIN, TIBC, IRON, RETICCTPCT in the last 72 hours. Urine analysis:    Component Value Date/Time   COLORURINE STRAW (A) 01/22/2018 1150   APPEARANCEUR CLEAR 01/22/2018 1150   LABSPEC <1.005 (L) 01/22/2018 1150   PHURINE 7.0 01/22/2018 1150   GLUCOSEU NEGATIVE 01/22/2018 1150   HGBUR NEGATIVE 01/22/2018 1150   BILIRUBINUR NEGATIVE 01/22/2018 1150   BILIRUBINUR neg 11/19/2012 0910   KETONESUR NEGATIVE 01/22/2018 1150   PROTEINUR NEGATIVE 01/22/2018 1150   UROBILINOGEN 0.2 12/02/2012 1715   NITRITE NEGATIVE 01/22/2018 1150   LEUKOCYTESUR NEGATIVE 01/22/2018 1150    Radiological Exams on Admission: DG Chest Portable 1 View  Result Date: 01/12/2021 CLINICAL DATA:  Bilateral lower extremity redness and swelling. EXAM: PORTABLE CHEST 1 VIEW COMPARISON:  August 12, 2017 FINDINGS: The lungs are hyperinflated. Multiple sternal wires are seen. Chronic appearing increased lung markings are noted. Mild linear scarring and/or atelectasis is seen within the left lung base. There is no evidence of a pleural effusion or pneumothorax. The heart size and mediastinal contours are within normal limits. There is moderate severity calcification of the thoracic aorta. The visualized skeletal structures are unremarkable.  IMPRESSION: 1. Evidence of prior median sternotomy/CABG. 2. Mild left basilar linear scarring and/or atelectasis. Electronically Signed   By: Virgina Norfolk M.D.   On: 01/12/2021 18:12   VAS [place] LOWER EXTREMITY VENOUS (DVT) ([facility] 7a-7p)  Result Date: 01/12/2021  Lower Venous DVT Study Indications: Pain, Swelling, and Erythema.  Comparison Study: No prior  study on file Performing Technologist: Sharion Dove RVS  Examination Guidelines: A complete evaluation includes B-mode imaging, spectral Doppler, color Doppler, and power Doppler as needed of all accessible portions of each vessel. Bilateral testing is considered an integral part of a complete examination. Limited examinations for reoccurring indications may be performed as noted. The reflux portion of the exam is performed with the patient in reverse Trendelenburg.  +---------+---------------+---------+-----------+----------+--------------+ RIGHT    CompressibilityPhasicitySpontaneityPropertiesThrombus Aging +---------+---------------+---------+-----------+----------+--------------+ CFV      Full           Yes      Yes                                 +---------+---------------+---------+-----------+----------+--------------+ SFJ      Full                                                        +---------+---------------+---------+-----------+----------+--------------+ FV Prox  Full                                                        +---------+---------------+---------+-----------+----------+--------------+ FV Mid   Full                                                        +---------+---------------+---------+-----------+----------+--------------+ FV DistalFull                                                        +---------+---------------+---------+-----------+----------+--------------+ PFV      Full                                                         +---------+---------------+---------+-----------+----------+--------------+ POP      Full           Yes      Yes                                 +---------+---------------+---------+-----------+----------+--------------+ PTV      Full                                                        +---------+---------------+---------+-----------+----------+--------------+ PERO     Full                                                        +---------+---------------+---------+-----------+----------+--------------+   +---------+---------------+---------+-----------+----------+--------------+  LEFT     CompressibilityPhasicitySpontaneityPropertiesThrombus Aging +---------+---------------+---------+-----------+----------+--------------+ CFV      Full           Yes      Yes                                 +---------+---------------+---------+-----------+----------+--------------+ SFJ      Full                                                        +---------+---------------+---------+-----------+----------+--------------+ FV Prox  Full                                                        +---------+---------------+---------+-----------+----------+--------------+ FV Mid   Full                                                        +---------+---------------+---------+-----------+----------+--------------+ FV DistalFull                                                        +---------+---------------+---------+-----------+----------+--------------+ PFV      Full                                                        +---------+---------------+---------+-----------+----------+--------------+ POP      Full           Yes      Yes                                 +---------+---------------+---------+-----------+----------+--------------+ PTV      None                                         Acute           +---------+---------------+---------+-----------+----------+--------------+ PERO     None                                         Acute          +---------+---------------+---------+-----------+----------+--------------+     Summary: RIGHT: - There is no evidence of deep vein thrombosis in the lower extremity.  LEFT: - Findings consistent with acute deep vein thrombosis involving one of the paired veins of left posterior tibial veins, and left peroneal veins.  *See table(s) above for measurements and observations.    Preliminary  EKG: Independently reviewed.  Sinus rhythm, artifact.  Assessment/Plan Principal Problem:   Acute DVT (deep venous thrombosis) (HCC) Active Problems:   Hypertension   Hypercholesterolemia   Cellulitis   Dementia (HCC)   Acute left lower extremity DVT -Bilateral lower extremity Dopplers were done and revealed acute DVT involving one of the paired veins of the left posterior tibial veins and left peroneal veins.  -Start Eliquis -PE less likely given lack of hypoxia, tachycardia, or signs of respiratory distress.  Bilateral lower extremity cellulitis -Signs of bilateral lower extremity cellulitis on exam. -No fever, tachycardia, or leukocytosis to suggest sepsis. -Continue doxycycline as there is concern that she might have been scratched by her cat.  Dementia/poor living situation -Appears to have advanced dementia but lives on her own and is not able to take care of herself (see HPI).  Based on all the information provided by the neighbor, it is absolutely not safe for her to return to her house. Social work consulted to help with placement.  Suicidal ideation History of depression, anxiety -Neighbor is concerned that patient has threatened to kill herself multiple times by stating she will shoot herself or use a blade to cut herself.  -Suicide precautions; safety sitter at bedside -Psych consult  Hypertension: Stable. -Resume home antihypertensive  after pharmacy med rec is done.  ? Paroxysmal A. Fib -A. fib is listed in her chart.  Patient is followed by Dr. Marisa Sprinkles from cardiology and his note from 07/30/2017 mentions: "? Afib. I have reviewed Ecgs from 10/16 and today. I think these both show NSR. P wave are quite small but present. No further treatment warranted."  Abnormal lab value -Calcium slightly low on BMP. -Check albumin level to calculate corrected calcium level.  Elevated BNP Bilateral lower extremity edema -BNP mildly elevated and significant pitting edema of bilateral lower extremities: No signs of pulmonary edema on chest x-ray. -Start IV Lasix 20 mg daily.  Continue to monitor renal function closely.  Onychomycosis of toenails -Pharmacy is searching for a topical antifungal agent that is available on the formulary.  Pharmacy med rec pending.  DVT prophylaxis: Xarelto Code Status: Full code Family Communication: Neighbor at bedside. Disposition Plan: Status is: Observation  The patient remains OBS appropriate and will d/c before 2 midnights.  Dispo: The patient is from: Home              Anticipated d/c is to: SNF              Patient currently is not medically stable to d/c.   Difficult to place patient No  Level of care: Level of care: Med-Surg   The medical decision making on this patient was of high complexity and the patient is at high risk for clinical deterioration, therefore this is a level 3 visit.  Shela Leff MD [facility]  If 7PM-7AM, please contact night-coverage [email or web address]  01/12/2021, 8:49 PM

## 2021-01-12 NOTE — ED Notes (Signed)
Called Vascular Tech@16 :42pm.

## 2021-01-12 NOTE — Progress Notes (Signed)
ANTICOAGULATION CONSULT NOTE - Initial Consult  Pharmacy Consult for Eliquis  Indication: DVT  Allergies  Allergen Reactions  . Codeine Nausea And Vomiting    Patient Measurements:    Vital Signs: Temp: 98 F (36.7 C) (04/03 1542) Temp Source: Oral (04/03 1542) BP: 152/84 (04/03 1930) Pulse Rate: 64 (04/03 1930)  Labs: Recent Labs    01/12/21 1727  HGB 11.2*  HCT 34.4*  PLT 208  CREATININE 0.70    CrCl cannot be calculated (Unknown ideal weight.).   Medical History: Past Medical History:  Diagnosis Date  . Acute blood loss anemia    Postoperative  . Anemia   . Aortic aneurysm, thoracic ([place])   . Aortic insufficiency   . Cancer ([place])    colon  . Depression with anxiety   . Hypercholesterolemia   . Hypertension   . Paroxysmal atrial fibrillation (HCC)   . S/P AVR (aortic valve replacement) and aortoplasty 09/30/2012  . Thrombocytopenia (HCC)    Postoperative thrombocytopenia   Assessment: 87 y/o F admitted with acute DVT and cellulitis to start apixaban.   Plan:  Apixaban 10 mg bid x 7 days followed by 5 mg bid thereafter.   Ulice Dash D 01/12/2021,8:59 PM

## 2021-01-12 NOTE — Discharge Instructions (Signed)
Information on my medicine - ELIQUIS (apixaban)  This medication education was reviewed with me or my healthcare representative as part of my discharge preparation.  The pharmacist that spoke with me during my hospital stay was:    Why was Eliquis prescribed for you? Eliquis was prescribed to treat blood clots that may have been found in the veins of your legs (deep vein thrombosis) or in your lungs (pulmonary embolism) and to reduce the risk of them occurring again.  What do You need to know about Eliquis ? The starting dose is 10 mg (two 5 mg tablets) taken TWICE daily for the FIRST SEVEN (7) DAYS, then on  4/10  the dose is reduced to ONE 5 mg tablet taken TWICE daily.  Eliquis may be taken with or without food.   Try to take the dose about the same time in the morning and in the evening. If you have difficulty swallowing the tablet whole please discuss with your pharmacist how to take the medication safely.  Take Eliquis exactly as prescribed and DO NOT stop taking Eliquis without talking to the doctor who prescribed the medication.  Stopping may increase your risk of developing a new blood clot.  Refill your prescription before you run out.  After discharge, you should have regular check-up appointments with your healthcare provider that is prescribing your Eliquis.    What do you do if you miss a dose? If a dose of ELIQUIS is not taken at the scheduled time, take it as soon as possible on the same day and twice-daily administration should be resumed. The dose should not be doubled to make up for a missed dose.  Important Safety Information A possible side effect of Eliquis is bleeding. You should call your healthcare provider right away if you experience any of the following: ? Bleeding from an injury or your nose that does not stop. ? Unusual colored urine (red or dark brown) or unusual colored stools (red or black). ? Unusual bruising for unknown reasons. ? A serious fall  or if you hit your head (even if there is no bleeding).  Some medicines may interact with Eliquis and might increase your risk of bleeding or clotting while on Eliquis. To help avoid this, consult your healthcare provider or pharmacist prior to using any new prescription or non-prescription medications, including herbals, vitamins, non-steroidal anti-inflammatory drugs (NSAIDs) and supplements.  This website has more information on Eliquis (apixaban): http://www.eliquis.com/eliquis/home

## 2021-01-12 NOTE — ED Triage Notes (Signed)
Bilateral lower extremity redness and swelling. As per EMS pt has dementia, lives alone with cats who scratch her legs. Neighbors called EMS. Pt denies any pain.

## 2021-01-12 NOTE — ED Provider Notes (Signed)
[place] DEPT Provider Note   CSN: [id number] Arrival date & time: 01/12/21  1528     History Chief Complaint  Patient presents with  . Leg Pain    [name] [name] is a 87 y.o. female.  The history is provided by the patient. No language interpreter was used.  Leg Pain    87 year old female significant history of colon cancer, depression, anxiety, paroxysmal atrial fibrillation, hypertension, brought here via EMS.  Per EMS note, patient lives at home alone.  She has 5 cats.  Neighbor noted redness on her legs and report she was scratched by her cat and they voiced concern that she may have infection.  Patient is a poor historian.  She does not know why she is in the ED.  States that she is not having any complaints.  When asked about her leg she did mention noticing redness and swelling for unknown amount of time.  She is unsure if her cat scratched her.  She did not complain of anything for pain.  She denies fever chills chest pain shortness of breath headache abdominal pain nausea vomiting diarrhea.  Past Medical History:  Diagnosis Date  . Acute blood loss anemia    Postoperative  . Anemia   . Aortic aneurysm, thoracic ([place])   . Aortic insufficiency   . Cancer ([place])    colon  . Depression with anxiety   . Hypercholesterolemia   . Hypertension   . Paroxysmal atrial fibrillation (HCC)   . S/P AVR (aortic valve replacement) and aortoplasty 09/30/2012  . Thrombocytopenia ([place])    Postoperative thrombocytopenia    Patient Active Problem List   Diagnosis Date Noted  . DDD (degenerative disc disease), cervical 12/23/2018  . Chest pain 08/12/2017  . Dyspnea 08/12/2017  . Acute blood loss anemia 03/06/2013  . Anxiety state 03/06/2013  . Depression 03/06/2013  . Unspecified constipation 03/06/2013  . Fracture tibia/fibula 12/02/2012  . Hypokalemia 12/02/2012  . S/P AVR (aortic valve replacement) and aortoplasty 09/30/2012  . HTN  (hypertension) 09/30/2012  . Hypertension   . Hypercholesterolemia   . Paroxysmal atrial fibrillation (HCC)   . Aortic aneurysm, thoracic ([place])   . Aortic insufficiency     Past Surgical History:  Procedure Laterality Date  . AORTIC VALVE REPLACEMENT  03/20/2009   mild aortic stenosis & mod to severe A1, lt verticular wall thickness was normal & lt ventricular function was estimated to be 55 to 60%,no mitral valve regugitation or stenosis  . COLON SURGERY  1973   for colon cancer  . COLONOSCOPY  12/29/2011   Procedure: COLONOSCOPY;  Surgeon: Cleotis Nipper, MD;  Location: [facility] ENDOSCOPY;  Service: Endoscopy;  Laterality: N/A;  . COLONOSCOPY  08/30/2012   Procedure: COLONOSCOPY;  Surgeon: Cleotis Nipper, MD;  Location: [facility] ENDOSCOPY;  Service: Endoscopy;  Laterality: N/A;  . HOT HEMOSTASIS  12/29/2011   Procedure: HOT HEMOSTASIS (ARGON PLASMA COAGULATION/BICAP);  Surgeon: Cleotis Nipper, MD;  Location: [facility] ENDOSCOPY;  Service: Endoscopy;  Laterality: N/A;  . HOT HEMOSTASIS  08/30/2012   Procedure: HOT HEMOSTASIS (ARGON PLASMA COAGULATION/BICAP);  Surgeon: Cleotis Nipper, MD;  Location: [facility] ENDOSCOPY;  Service: Endoscopy;  Laterality: N/A;  . ROTATOR CUFF REPAIR     Right Shoulder  . TIBIA IM NAIL INSERTION Left 12/03/2012   Procedure: INTRAMEDULLARY (IM) NAIL TIBIAL;  Surgeon: Mauri Pole, MD;  Location: [facility] ORS;  Service: Orthopedics;  Laterality: Left;     OB History   No  obstetric history on file.     Family History  Problem Relation Age of Onset  . Parkinsonism Mother   . Hypertension Father     Social History   Tobacco Use  . Smoking status: Never Smoker  . Smokeless tobacco: Never Used  Substance Use Topics  . Alcohol use: No  . Drug use: No    Home Medications Prior to Admission medications   Medication Sig Start Date End Date Taking? Authorizing Provider  acetaminophen (TYLENOL) 500 MG tablet Take 500 mg by mouth every 6 (six) hours as needed for mild pain,  moderate pain, fever or headache.    [provider]  amLODipine (NORVASC) 5 MG tablet Take 1 tablet by mouth daily. 11/12/15   [provider]  donepezil (ARICEPT) 10 MG tablet Take 1 tablet (10 mg total) by mouth at bedtime. 06/13/20   Dohmeier, Asencion Partridge, MD  escitalopram (LEXAPRO) 10 MG tablet escitalopram 10 mg tablet  TAKE 1 & 1 2 (ONE & ONE HALF) TABLETS BY MOUTH ONCE DAILY FOR 90 DAYS    [provider]  irbesartan (AVAPRO) 300 MG tablet Take 1 tablet by mouth daily. Patient not taking: Reported on 03/21/2020 11/12/15   [provider]  traMADol (ULTRAM) 50 MG tablet Take by mouth every 6 (six) hours as needed.  10/19/16   [provider]    Allergies    Codeine  Review of Systems   Review of Systems  All other systems reviewed and are negative.   Physical Exam Updated Vital Signs BP (!) 152/84   Pulse 64   Temp 98 F (36.7 C) (Oral)   Resp (!) 4   LMP  (LMP Unknown)   SpO2 97%   Physical Exam Vitals and nursing note reviewed.  Constitutional:      General: She is not in acute distress.    Appearance: She is well-developed.     Comments: Elderly female well-appearing appears to be in no acute discomfort.  She is resting comfortably in bed  HENT:     Head: Atraumatic.  Eyes:     Conjunctiva/sclera: Conjunctivae normal.  Cardiovascular:     Rate and Rhythm: Normal rate and regular rhythm.     Pulses: Normal pulses.     Heart sounds: Normal heart sounds.  Pulmonary:     Effort: Pulmonary effort is normal.     Breath sounds: Normal breath sounds.  Abdominal:     Palpations: Abdomen is soft.  Musculoskeletal:     Cervical back: Neck supple.     Right lower leg: Edema (1+ edema to her right lower extremity with surrounding erythema and warmth.  No obvious signs of skin injury.  Intact dorsalis pedis leg compartment soft) present.     Comments: Left lower extremity: Several small skin disruption noted to distal lateral aspect of  the tib-fib oozing serosanguineous fluid. It is mildly erythematous  Skin:    Findings: No rash.  Neurological:     Mental Status: She is alert.     ED Results / Procedures / Treatments   Labs (all labs ordered are listed, but only abnormal results are displayed) Labs Reviewed  CBC WITH DIFFERENTIAL/PLATELET - Abnormal; Notable for the following components:      Result Value   Hemoglobin 11.2 (*)    HCT 34.4 (*)    RDW 15.9 (*)    All other components within normal limits  BASIC METABOLIC PANEL - Abnormal; Notable for the following components:   Sodium  134 (*)    Calcium 8.0 (*)    All other components within normal limits  BRAIN NATRIURETIC PEPTIDE - Abnormal; Notable for the following components:   B Natriuretic Peptide 288.2 (*)    All other components within normal limits  SARS CORONAVIRUS 2 (TAT 6-24 HRS)    EKG EKG Interpretation  Date/Time:  Sunday January 12 2021 17:06:46 EDT Ventricular Rate:  72 PR Interval:    QRS Duration: 86 QT Interval:  429 QTC Calculation: 470 R Axis:   83 Text Interpretation: probable sinus rhythm with PACs Borderline right axis deviation Borderline low voltage, extremity leads Confirmed by Malvin Johns [phone]) on 01/12/2021 5:33:52 PM   Radiology DG Chest Portable 1 View  Result Date: 01/12/2021 CLINICAL DATA:  Bilateral lower extremity redness and swelling. EXAM: PORTABLE CHEST 1 VIEW COMPARISON:  August 12, 2017 FINDINGS: The lungs are hyperinflated. Multiple sternal wires are seen. Chronic appearing increased lung markings are noted. Mild linear scarring and/or atelectasis is seen within the left lung base. There is no evidence of a pleural effusion or pneumothorax. The heart size and mediastinal contours are within normal limits. There is moderate severity calcification of the thoracic aorta. The visualized skeletal structures are unremarkable. IMPRESSION: 1. Evidence of prior median sternotomy/CABG. 2. Mild left basilar linear scarring  and/or atelectasis. Electronically Signed   By: Virgina Norfolk M.D.   On: 01/12/2021 18:12   VAS [place] LOWER EXTREMITY VENOUS (DVT) ([facility] 7a-7p)  Result Date: 01/12/2021  Lower Venous DVT Study Indications: Pain, Swelling, and Erythema.  Comparison Study: No prior study on file Performing Technologist: Sharion Dove RVS  Examination Guidelines: A complete evaluation includes B-mode imaging, spectral Doppler, color Doppler, and power Doppler as needed of all accessible portions of each vessel. Bilateral testing is considered an integral part of a complete examination. Limited examinations for reoccurring indications may be performed as noted. The reflux portion of the exam is performed with the patient in reverse Trendelenburg.  +---------+---------------+---------+-----------+----------+--------------+ RIGHT    CompressibilityPhasicitySpontaneityPropertiesThrombus Aging +---------+---------------+---------+-----------+----------+--------------+ CFV      Full           Yes      Yes                                 +---------+---------------+---------+-----------+----------+--------------+ SFJ      Full                                                        +---------+---------------+---------+-----------+----------+--------------+ FV Prox  Full                                                        +---------+---------------+---------+-----------+----------+--------------+ FV Mid   Full                                                        +---------+---------------+---------+-----------+----------+--------------+ FV DistalFull                                                        +---------+---------------+---------+-----------+----------+--------------+  PFV      Full                                                        +---------+---------------+---------+-----------+----------+--------------+ POP      Full           Yes      Yes                                  +---------+---------------+---------+-----------+----------+--------------+ PTV      Full                                                        +---------+---------------+---------+-----------+----------+--------------+ PERO     Full                                                        +---------+---------------+---------+-----------+----------+--------------+   +---------+---------------+---------+-----------+----------+--------------+ LEFT     CompressibilityPhasicitySpontaneityPropertiesThrombus Aging +---------+---------------+---------+-----------+----------+--------------+ CFV      Full           Yes      Yes                                 +---------+---------------+---------+-----------+----------+--------------+ SFJ      Full                                                        +---------+---------------+---------+-----------+----------+--------------+ FV Prox  Full                                                        +---------+---------------+---------+-----------+----------+--------------+ FV Mid   Full                                                        +---------+---------------+---------+-----------+----------+--------------+ FV DistalFull                                                        +---------+---------------+---------+-----------+----------+--------------+ PFV      Full                                                        +---------+---------------+---------+-----------+----------+--------------+  POP      Full           Yes      Yes                                 +---------+---------------+---------+-----------+----------+--------------+ PTV      None                                         Acute          +---------+---------------+---------+-----------+----------+--------------+ PERO     None                                         Acute           +---------+---------------+---------+-----------+----------+--------------+     Summary: RIGHT: - There is no evidence of deep vein thrombosis in the lower extremity.  LEFT: - Findings consistent with acute deep vein thrombosis involving one of the paired veins of left posterior tibial veins, and left peroneal veins.  *See table(s) above for measurements and observations.    Preliminary     Procedures Procedures   Medications Ordered in ED Medications  haloperidol lactate (HALDOL) injection 5 mg (has no administration in time range)  Rivaroxaban (XARELTO) tablet 15 mg (has no administration in time range)    Followed by  rivaroxaban (XARELTO) tablet 20 mg (has no administration in time range)  Tdap (BOOSTRIX) injection 0.5 mL (0.5 mLs Intramuscular Given 01/12/21 1754)  doxycycline (VIBRA-TABS) tablet 100 mg (100 mg Oral Given 01/12/21 1756)    ED Course  I have reviewed the triage vital signs and the nursing notes.  Pertinent labs & imaging results that were available during my care of the patient were reviewed by me and considered in my medical decision making (see chart for details).    MDM Rules/Calculators/A&P                          BP (!) 152/84   Pulse 64   Temp 98 F (36.7 C) (Oral)   Resp (!) 4   LMP  (LMP Unknown)   SpO2 97%   Final Clinical Impression(s) / ED Diagnoses Final diagnoses:  Acute deep vein thrombosis (DVT) of left peroneal vein (HCC)  Cellulitis of right leg    Rx / DC Orders ED Discharge Orders    None     4:01 PM Patient is a poor historian, lives at home by herself.  Neighbor was worried that she may have leg infection from cat scratch when they noted that her leg was red.  Patient without any significant complaints she is overall well-appearing.  She does have some cellulitic skin changes to her right lower extremity as well as some skin irritation on her left lower extremity.  Plan to treat with abx to cover for infection. I did discussed care  with Dr. Tamera Punt.    Given that pt is a poor historian and we are unsure the chronicity of her leg swelling.  Will obtain labs, EKG, CXR, venous doppler study.   8:27 PM Venous Doppler study obtained demonstrate finding consistent with acute DVT involving the left posterior tibial vein and left peroneal veins.  I have  ordered Xarelto as treatment.  She has normal renal function.  I have also given doxycycline for cellulitis.  BNP is elevated at 288.  Patient lives at home by herself, she has history of dementia, unable to tell me the month or the year and states that her mom is still alive despite her close friend and neighbor who is at bedside states that she lives at home.  She does have family members in [place] and we did attempt to contact them.  There wishes to have patient admitted.  Patient however does not want to be admitted.  She is unsafe to go home on blood thinner medication after discussion with hospitalist, Dr. Marlowe Sax, who agrees to admit patient, will give Xarelto and will also provide a dose of Haldol to provide stability.   Domenic Moras, PA-C 01/12/21 2034    Malvin Johns, MD 01/12/21 2259

## 2021-01-12 NOTE — ED Notes (Signed)
Pt is ambulatory to restroom

## 2021-01-12 NOTE — Progress Notes (Signed)
VASCULAR LAB    Bilateral lower extremity venous duplex has been performed.  See CV proc for preliminary results.  Gave verbal report to Domenic Moras, PA-C  Ramla Hase, RVT 01/12/2021, 7:27 PM

## 2021-01-13 ENCOUNTER — Observation Stay (HOSPITAL_BASED_OUTPATIENT_CLINIC_OR_DEPARTMENT_OTHER): Payer: Medicare Other

## 2021-01-13 DIAGNOSIS — R5381 Other malaise: Secondary | ICD-10-CM

## 2021-01-13 DIAGNOSIS — F32A Depression, unspecified: Secondary | ICD-10-CM

## 2021-01-13 DIAGNOSIS — I824Y2 Acute embolism and thrombosis of unspecified deep veins of left proximal lower extremity: Secondary | ICD-10-CM | POA: Diagnosis not present

## 2021-01-13 DIAGNOSIS — F0280 Dementia in other diseases classified elsewhere without behavioral disturbance: Secondary | ICD-10-CM | POA: Diagnosis not present

## 2021-01-13 DIAGNOSIS — G301 Alzheimer's disease with late onset: Secondary | ICD-10-CM

## 2021-01-13 DIAGNOSIS — R600 Localized edema: Secondary | ICD-10-CM | POA: Diagnosis not present

## 2021-01-13 DIAGNOSIS — L03115 Cellulitis of right lower limb: Secondary | ICD-10-CM | POA: Diagnosis not present

## 2021-01-13 DIAGNOSIS — I10 Essential (primary) hypertension: Secondary | ICD-10-CM

## 2021-01-13 DIAGNOSIS — I82452 Acute embolism and thrombosis of left peroneal vein: Secondary | ICD-10-CM | POA: Diagnosis not present

## 2021-01-13 DIAGNOSIS — R7989 Other specified abnormal findings of blood chemistry: Secondary | ICD-10-CM

## 2021-01-13 DIAGNOSIS — F419 Anxiety disorder, unspecified: Secondary | ICD-10-CM

## 2021-01-13 DIAGNOSIS — Z591 Inadequate housing: Secondary | ICD-10-CM

## 2021-01-13 DIAGNOSIS — D649 Anemia, unspecified: Secondary | ICD-10-CM

## 2021-01-13 DIAGNOSIS — E876 Hypokalemia: Secondary | ICD-10-CM

## 2021-01-13 LAB — COMPREHENSIVE METABOLIC PANEL
ALT: 22 U/L (ref 0–44)
AST: 24 U/L (ref 15–41)
Albumin: 2.5 g/dL — ABNORMAL LOW (ref 3.5–5.0)
Alkaline Phosphatase: 71 U/L (ref 38–126)
Anion gap: 8 (ref 5–15)
BUN: 18 mg/dL (ref 8–23)
CO2: 28 mmol/L (ref 22–32)
Calcium: 7.8 mg/dL — ABNORMAL LOW (ref 8.9–10.3)
Chloride: 101 mmol/L (ref 98–111)
Creatinine, Ser: 0.72 mg/dL (ref 0.44–1.00)
GFR, Estimated: >60 mL/min (ref >60)
Glucose, Bld: 92 mg/dL (ref 70–99)
Potassium: 3.2 mmol/L — ABNORMAL LOW (ref 3.5–5.1)
Sodium: 137 mmol/L (ref 135–145)
Total Bilirubin: 0.6 mg/dL (ref 0.3–1.2)
Total Protein: 5.5 g/dL — ABNORMAL LOW (ref 6.5–8.1)

## 2021-01-13 LAB — ECHOCARDIOGRAM COMPLETE
AV Mean grad: 6.3 mmHg
AV Peak grad: 13 mmHg
Ao pk vel: 1.8 m/s
Area-P 1/2: 7.51 cm2
Height: 64 in
S' Lateral: 3 cm
Weight: 2031.76 oz

## 2021-01-13 LAB — CBC
HCT: 31.3 % — ABNORMAL LOW (ref 36.0–46.0)
Hemoglobin: 10.2 g/dL — ABNORMAL LOW (ref 12.0–15.0)
MCH: 28.4 pg (ref 26.0–34.0)
MCHC: 32.6 g/dL (ref 30.0–36.0)
MCV: 87.2 fL (ref 80.0–100.0)
Platelets: 216 10*3/uL (ref 150–400)
RBC: 3.59 MIL/uL — ABNORMAL LOW (ref 3.87–5.11)
RDW: 16 % — ABNORMAL HIGH (ref 11.5–15.5)
WBC: 8.7 10*3/uL (ref 4.0–10.5)
nRBC: 0 % (ref 0.0–0.2)

## 2021-01-13 LAB — MAGNESIUM: Magnesium: 1.8 mg/dL (ref 1.7–2.4)

## 2021-01-13 LAB — SARS CORONAVIRUS 2 (TAT 6-24 HRS): SARS Coronavirus 2: NEGATIVE

## 2021-01-13 MED ORDER — ESCITALOPRAM OXALATE 20 MG PO TABS
20.0000 mg | ORAL_TABLET | Freq: Every day | ORAL | Status: DC
  Administered 2021-01-13 – 2021-01-16 (×4): 20 mg via ORAL
  Filled 2021-01-13 (×4): qty 1

## 2021-01-13 MED ORDER — POTASSIUM CHLORIDE CRYS ER 20 MEQ PO TBCR
40.0000 meq | EXTENDED_RELEASE_TABLET | ORAL | Status: AC
  Administered 2021-01-13 (×2): 40 meq via ORAL
  Filled 2021-01-13 (×2): qty 2

## 2021-01-13 NOTE — Progress Notes (Signed)
  Echocardiogram 2D Echocardiogram has been performed.  Darlina Sicilian M 01/13/2021, 11:44 AM

## 2021-01-13 NOTE — Consult Note (Addendum)
[name] [name] is a 87 y.o. female with medical history significant of dementia, anemia, thoracic aortic aneurysm, aortic insufficiency status post AVR and aortoplasty in 2013, history of colon cancer status post surgery in 1973, depression, anxiety, hypertension, hyperlipidemia, paroxysmal A. Fib presented to the ED via EMS for evaluation of bilateral lower extremity redness and swelling.  EMS reported that patient has dementia and lives alone with cats.  Neighbor noticed redness on her legs and reported that she was scratched by her cat and they were worried that she may have an infection.  In the ED, vital signs stable.  Labs showing WBC 9.1, hemoglobin 11.2 (stable), platelet count 208K.  Sodium 134, potassium 4.0, chloride 101, bicarb 25, BUN 21, creatinine 0.7, glucose 87, calcium 8.0.  BNP 288.  Chest x-ray showing mild left basilar linear scarring and/or atelectasis; no pulmonary edema. Bilateral lower extremity Dopplers were done and revealed acute DVT involving one of the paired veins of the left posterior tibial veins and left peroneal veins. Patient was given doxycycline.  Patient is seen and assessed by this nurse practitioner. She is observed to be sitting upright in her bedside chair.  Psych consult placed for suicidal ideations. Patient is oriented to self only.  She tells me " I am 87 years old.  I am at home.  I have not been to the hospital in a while."  She also reports that she" lives at home with her mother, who is in her early 31s."  Patient denies any previous psychiatric history, although there appears to be some documented depression with anxiety and medical history.  Patient denies any previous suicide attempts, self-harm behaviors, and or suicidal ideations or thoughts.  When assessing for suicidality she states" God No.  I would never do that. God No.  Who would not want to do that to themselves?"  When assessing for previous suicide attempts and or self-harm behaviors patient  repeats "God No. I would never. "  Chart reviewed shows patient being prescribed Escitalopram 15 mg and a 20 mg.  Patient denies any current suicidal thoughts, homicidal thoughts, and or auditory or visual hallucinations. Patient likely has ongoing delirium which presents as fluctuating cognition.  At time of this assessment patient has full capacity.  Unclear at this time of patient's baseline, however her cognitive impairment does appear to be fluctuating.  It was noted yesterday that she was alert and oriented x2, today she is only alert and oriented x1.  Patient does have known diagnosis for dementia, and is at high risk for developing delirium.  Virtually any medical condition or physiologic stress can precipitate delirium in a susceptible individual, with risk increasing in those with: advanced age, sensory impairments, organic brain disease (stroke, dementia, Parkinsons), psychiatric illness, major chronic medical issues, prolonged hospitalizations, postoperative status, anemia, insomnia/disturbed sleep, and severe pain. Addressing the underlying medical condition and institution of preventative measures are recommended.  - Continue to monitor and treat underlying medical causes of delirium, including infection, electrolyte disturbances, etc. - Delirium precautions - Minimize/avoid deliriogenic meds including: anticholinergic, opiates, benzodiazepines           - Maintain hydration, oxygenation, nutrition           - Limit use of restraints and catheters           - Normalize sleep patterns by minimizing nighttime noise, light and interruptions by     -Ensure sleep apnea treatment is provided overnight.  clustering care, opening blinds during the day           - Reorient the patient frequently, provide easily visible clock and calendar           - Provide sensory aids like glasses, hearing aids           - Encourage ambulation, regular activities and visitors to maintain cognitive  stimulation   -Patient would benefit from having family members at bedside to reinforce his orientation.  -Will resume Escitalopram 20 mg p.o. daily for management of depression and anxiety.  Chart review indicates last filled at [facility] in March 2022. -Recommend discontinuing suicide sitter, as patient denies suicidal thoughts.  She is currently of low lethality for suicide completion at this time.  -Patient is at risk for developing delirium, if needed please place patient close to the nurses station and consider safety sitter.   -Patient does not meet inpatient criteria.  -Recommend APS referral, considering neighbors concerns about her living environment. -Psychiatry to sign off at this time.

## 2021-01-13 NOTE — Progress Notes (Signed)
PROGRESS NOTE  [name] [name] BHA:[id number] DOB: 01/02/1930   PCP: Deland Pretty, MD  Patient is from: Home.  Lives alone with 3 cats.  No family members close by.  DOA: 01/12/2021 LOS: 0  Chief complaints: Leg swelling and redness  Brief Narrative / Interim history: 87 year old F with PMH of dementia, thoracic aortic aneurysm, aortic insufficiency s/p AVR and aortoplasty in 2013, paroxysmal A. Fib not on AC, remote colon cancer in 1973, HTN, HLD, anxiety and depression brought to ED by EMS after neighbors called EMS out of concern for bilateral lower extremity swelling and redness, poor living situation and suicidality.  In ED, vitals, basic labs and CXR without acute significant finding.  BNP 288.  LE US showed DVT in left posterior tibial and left peroneal veins.  Patient was started on p.o. Eliquis for DVT, doxycycline for possible cellulitis from cat scratch and IV Lasix for leg swelling and possible acute CHF.  Psychiatry consulted.   Subjective: Seen and examined earlier this morning.  No major events overnight of this morning.  No complaints but not a reliable historian.  She is only oriented to self and place.  She says she lives with her mother.  She says her mother is in her 76s.  She denies chest pain, dyspnea, nausea, vomiting, abdominal pain or UTI symptoms.  She denies suicidal ideation.  Objective: Vitals:   01/12/21 2257 01/12/21 2334 01/13/21 0324 01/13/21 0730  BP: (!) 144/123 138/70 (!) 155/85 (!) 146/87  Pulse: 85 (!) 44 83 90  Resp: 16 16 16 17   Temp: 98.2 F (36.8 C) 97.8 F (36.6 C) 98.9 F (37.2 C) 98 F (36.7 C)  TempSrc: Oral Oral Oral Oral  SpO2: 100% 96% 95% 98%  Weight:      Height:        Intake/Output Summary (Last 24 hours) at 01/13/2021 1051 Last data filed at 01/13/2021 0648 Gross per 24 hour  Intake 100 ml  Output 2150 ml  Net -2050 ml   Filed Weights   01/12/21 2124  Weight: 57.6 kg    Examination:  GENERAL: No apparent distress.   Nontoxic. HEENT: MMM.  Vision and hearing grossly intact.  NECK: Supple.  No apparent JVD.  RESP: On RA.  No IWOB.  Fair aeration bilaterally. CVS:  RRR. Heart sounds normal.  ABD/GI/GU: BS+. Abd soft, NTND.  MSK/EXT:  Moves extremities.  +1 pedal edema in left leg.  Small skin laceration with surrounding skin erythema but no increased warmth to touch.  Good DP pulses bilaterally. SKIN: no apparent skin lesion or wound NEURO: Awake and alert.  Oriented to self and place only.  No apparent focal neuro deficit. PSYCH: Calm. Normal affect.   Procedures:  None  Microbiology summarized: COVID-19 PCR nonreactive.  Assessment & Plan: Acute left lower extremity DVT involving one of the paired veins of the left posterior tibial veins and left peroneal veins.  -On a starter pack Eliquis. -Low suspicion for large PE  Left lower extremity cellulitis in the setting of cat scratch: No systemic symptoms or leukocytosis to suggest sepsis. -Continue p.o. doxycycline  Dementia without behavioral disturbance/poor living situation-only oriented to self and place.  She says she lives with her mother.  She thinks her mother is in her 41s. -Reorientation and delirium and fall precautions -Continue home Aricept -She is not safe to be by herself -TOC consulted.  Elevated BNP: TTE in 2018 with LVEF of 60 to 65%, G1 DD but no other significant finding.  No cardiopulmonary symptoms but left lower extremity pedal edema.  BNP elevated to 288.  Personally reviewed chest x-ray without signs pulmonary vascular congestion.  Started on IV Lasix.  She had 2.2 L UOP overnight.  Renal function is stable. -Continue IV Lasix 20 mg daily for now -Updated TTE -Monitor fluid status and renal function  History of anxiety and depression: Stable.  She denies suicidal ideation.  Per H&P, concern came from patient's neighbor who called EMS.  Appreciate input by psychiatry. -Discontinue safety sitter -Continue home  Lexapro  Hypertension: Stable.  Amlodipine and Avapro on her medication list but does not look like she takes Avapro. -Hold home amlodipine in the setting of edema -Continue IV Lasix as above  Paroxysmal A. Fib?  She is in sinus rhythm.  Not on AC of medications at home. -Optimize K and Mg  Hyponatremia: Hypervolemic?  Resolved with IV diuretics. -Continue monitoring  Hypokalemia: K3.2. -Replenish and recheck -Check magnesium  Normocytic anemia Recent Labs    01/12/21 1727 01/13/21 0305  HGB 11.2* 10.2*  -Check anemia panel -Monitor H&H   Enlarged and hypertrophic toenails -Needs outpatient podiatry for trimming.  Debility/physical deconditioning: Patient lives alone with her 3 cats with no family close by. -PT/OT eval  Body mass index is 21.8 kg/m.         DVT prophylaxis:   apixaban (ELIQUIS) tablet 10 mg  apixaban (ELIQUIS) tablet 5 mg  Code Status: Full code Family Communication: Patient and/or RN. Available if any question.  Level of care: Med-Surg Status is: Observation  The patient will require care spanning > 2 midnights and should be moved to inpatient because: Persistent severe electrolyte disturbances, Ongoing diagnostic testing needed not appropriate for outpatient work up, Unsafe d/c plan, IV treatments appropriate due to intensity of illness or inability to take PO and Inpatient level of care appropriate due to severity of illness  Dispo: The patient is from: Home              Anticipated d/c is to: SNF              Patient currently is not medically stable to d/c.   Difficult to place patient No       Consultants:  Psychiatry   Sch Meds:  Scheduled Meds: . apixaban  10 mg Oral BID   Followed by  . [START ON 01/19/2021] apixaban  5 mg Oral BID  . doxycycline  100 mg Oral Q12H  . escitalopram  20 mg Oral Daily  . furosemide  20 mg Intravenous Daily  . potassium chloride  40 mEq Oral Q3H   Continuous Infusions: PRN  Meds:.acetaminophen **OR** acetaminophen  Antimicrobials: Anti-infectives (From admission, onward)   Start     Dose/Rate Route Frequency Ordered Stop   01/13/21 0430  doxycycline (VIBRA-TABS) tablet 100 mg        100 mg Oral Every 12 hours 01/12/21 2058     01/12/21 1630  doxycycline (VIBRA-TABS) tablet 100 mg        100 mg Oral  Once 01/12/21 1627 01/12/21 1756       I have personally reviewed the following labs and images: CBC: Recent Labs  Lab 01/12/21 1727 01/13/21 0305  WBC 9.1 8.7  NEUTROABS 6.9  --   HGB 11.2* 10.2*  HCT 34.4* 31.3*  MCV 88.2 87.2  PLT 208 216   BMP &GFR Recent Labs  Lab 01/12/21 1727 01/13/21 0305  NA 134* 137  K 4.0 3.2*  CL  101 101  CO2 25 28  GLUCOSE 87 92  BUN 21 18  CREATININE 0.70 0.72  CALCIUM 8.0* 7.8*   Estimated Creatinine Clearance: 42 mL/min (by C-G formula based on SCr of 0.72 mg/dL). Liver & Pancreas: Recent Labs  Lab 01/13/21 0305  AST 24  ALT 22  ALKPHOS 71  BILITOT 0.6  PROT 5.5*  ALBUMIN 2.5*   No results for input(s): LIPASE, AMYLASE in the last 168 hours. No results for input(s): AMMONIA in the last 168 hours. Diabetic: No results for input(s): HGBA1C in the last 72 hours. No results for input(s): GLUCAP in the last 168 hours. Cardiac Enzymes: No results for input(s): CKTOTAL, CKMB, CKMBINDEX, TROPONINI in the last 168 hours. No results for input(s): PROBNP in the last 8760 hours. Coagulation Profile: No results for input(s): INR, PROTIME in the last 168 hours. Thyroid Function Tests: No results for input(s): TSH, T4TOTAL, FREET4, T3FREE, THYROIDAB in the last 72 hours. Lipid Profile: No results for input(s): CHOL, HDL, LDLCALC, TRIG, CHOLHDL, LDLDIRECT in the last 72 hours. Anemia Panel: No results for input(s): VITAMINB12, FOLATE, FERRITIN, TIBC, IRON, RETICCTPCT in the last 72 hours. Urine analysis:    Component Value Date/Time   COLORURINE STRAW (A) 01/22/2018 1150   APPEARANCEUR CLEAR 01/22/2018  1150   LABSPEC <1.005 (L) 01/22/2018 1150   PHURINE 7.0 01/22/2018 1150   GLUCOSEU NEGATIVE 01/22/2018 1150   HGBUR NEGATIVE 01/22/2018 1150   BILIRUBINUR NEGATIVE 01/22/2018 1150   BILIRUBINUR neg 11/19/2012 0910   KETONESUR NEGATIVE 01/22/2018 1150   PROTEINUR NEGATIVE 01/22/2018 1150   UROBILINOGEN 0.2 12/02/2012 1715   NITRITE NEGATIVE 01/22/2018 [place] 01/22/2018 1150   Sepsis Labs: Invalid input(s): PROCALCITONIN, [place]  Microbiology: Recent Results (from the past 240 hour(s))  SARS CORONAVIRUS 2 (TAT 6-24 HRS) Nasopharyngeal Nasopharyngeal Swab     Status: None   Collection Time: 01/12/21  8:31 PM   Specimen: Nasopharyngeal Swab  Result Value Ref Range Status   SARS Coronavirus 2 NEGATIVE NEGATIVE Final    Comment: (NOTE) SARS-CoV-2 target nucleic acids are NOT DETECTED.  The SARS-CoV-2 RNA is generally detectable in upper and lower respiratory specimens during the acute phase of infection. Negative results do not preclude SARS-CoV-2 infection, do not rule out co-infections with other pathogens, and should not be used as the sole basis for treatment or other patient management decisions. Negative results must be combined with clinical observations, patient history, and epidemiological information. The expected result is Negative.  Fact Sheet for Patients: SugarRoll.be  Fact Sheet for Healthcare Providers: https://www.woods-mathews.com/  This test is not yet approved or cleared by the [place] FDA and  has been authorized for detection and/or diagnosis of SARS-CoV-2 by FDA under an Emergency Use Authorization (EUA). This EUA will remain  in effect (meaning this test can be used) for the duration of the COVID-19 declaration under Se ction 564(b)(1) of the Act, 21 U.S.C. section 360bbb-3(b)(1), unless the authorization is terminated or revoked sooner.  Performed at [facility],  [place] [address], [place], [place] [address]     Radiology Studies: DG Chest Portable 1 View  Result Date: 01/12/2021 CLINICAL DATA:  Bilateral lower extremity redness and swelling. EXAM: PORTABLE CHEST 1 VIEW COMPARISON:  August 12, 2017 FINDINGS: The lungs are hyperinflated. Multiple sternal wires are seen. Chronic appearing increased lung markings are noted. Mild linear scarring and/or atelectasis is seen within the left lung base. There is no evidence of a pleural effusion or pneumothorax.  The heart size and mediastinal contours are within normal limits. There is moderate severity calcification of the thoracic aorta. The visualized skeletal structures are unremarkable. IMPRESSION: 1. Evidence of prior median sternotomy/CABG. 2. Mild left basilar linear scarring and/or atelectasis. Electronically Signed   By: Virgina Norfolk M.D.   On: 01/12/2021 18:12   VAS [place] LOWER EXTREMITY VENOUS (DVT) (MC and WL 7a-7p)  Result Date: 01/12/2021  Lower Venous DVT Study Indications: Pain, Swelling, and Erythema.  Comparison Study: No prior study on file Performing Technologist: Sharion Dove RVS  Examination Guidelines: A complete evaluation includes B-mode imaging, spectral Doppler, color Doppler, and power Doppler as needed of all accessible portions of each vessel. Bilateral testing is considered an integral part of a complete examination. Limited examinations for reoccurring indications may be performed as noted. The reflux portion of the exam is performed with the patient in reverse Trendelenburg.  +---------+---------------+---------+-----------+----------+--------------+ RIGHT    CompressibilityPhasicitySpontaneityPropertiesThrombus Aging +---------+---------------+---------+-----------+----------+--------------+ CFV      Full           Yes      Yes                                 +---------+---------------+---------+-----------+----------+--------------+ SFJ      Full                                                         +---------+---------------+---------+-----------+----------+--------------+ FV Prox  Full                                                        +---------+---------------+---------+-----------+----------+--------------+ FV Mid   Full                                                        +---------+---------------+---------+-----------+----------+--------------+ FV DistalFull                                                        +---------+---------------+---------+-----------+----------+--------------+ PFV      Full                                                        +---------+---------------+---------+-----------+----------+--------------+ POP      Full           Yes      Yes                                 +---------+---------------+---------+-----------+----------+--------------+ PTV      Full                                                        +---------+---------------+---------+-----------+----------+--------------+  PERO     Full                                                        +---------+---------------+---------+-----------+----------+--------------+   +---------+---------------+---------+-----------+----------+--------------+ LEFT     CompressibilityPhasicitySpontaneityPropertiesThrombus Aging +---------+---------------+---------+-----------+----------+--------------+ CFV      Full           Yes      Yes                                 +---------+---------------+---------+-----------+----------+--------------+ SFJ      Full                                                        +---------+---------------+---------+-----------+----------+--------------+ FV Prox  Full                                                        +---------+---------------+---------+-----------+----------+--------------+ FV Mid   Full                                                         +---------+---------------+---------+-----------+----------+--------------+ FV DistalFull                                                        +---------+---------------+---------+-----------+----------+--------------+ PFV      Full                                                        +---------+---------------+---------+-----------+----------+--------------+ POP      Full           Yes      Yes                                 +---------+---------------+---------+-----------+----------+--------------+ PTV      None                                         Acute          +---------+---------------+---------+-----------+----------+--------------+ PERO     None                                         Acute          +---------+---------------+---------+-----------+----------+--------------+  Summary: RIGHT: - There is no evidence of deep vein thrombosis in the lower extremity.  LEFT: - Findings consistent with acute deep vein thrombosis involving one of the paired veins of left posterior tibial veins, and left peroneal veins.  *See table(s) above for measurements and observations.    Preliminary       Fermina Mishkin T. Atkinson  If 7PM-7AM, please contact night-coverage [email or web address] 01/13/2021, 10:51 AM

## 2021-01-13 NOTE — Evaluation (Signed)
Physical Therapy Evaluation Patient Details Name: [name] [name] MRN: [id number] DOB: Jul 30, 1930 Today's Date: 01/13/2021   History of Present Illness  [name] [name] is a 87 y.o. female with medical history significant of dementia, anemia, thoracic aortic aneurysm, aortic insufficiency status post AVR and aortoplasty in 2013, history of colon cancer status post surgery in 1973, depression, anxiety, hypertension, hyperlipidemia, paroxysmal A. Fib presented to the ED via EMS for evaluation of bilateral lower extremity redness and swelling after one of her feral cats scratched her leg per neighbor report. Bilateral lower extremity Dopplers were done and revealed acute DVT involving one of the paired veins of the left posterior tibial veins and left peroneal veins. Pt also dx with LE cellulitis. Pt's neighbor is also requesting to keep her on suicide watch as she has threatened to kill herself multiple times by stating she will shoot herself or use a blade to cut herself.  Clinical Impression  Patient is mobile  With 1 min guard and Rw. Patient ambulated x 120' using RW and min guard to supervision. Pt admitted with above diagnosis.  Pt currently with functional limitations due to the deficits listed below (see PT Problem List). Pt will benefit from skilled PT to increase their independence and safety with mobility to allow discharge to the venue listed below.        Follow Up Recommendations Needs 24 hour care givers/supervision    Equipment Recommendations  None recommended by PT    Recommendations for Other Services       Precautions / Restrictions Precautions Precautions: Fall Precaution Comments: Suicide precautions with 1:1 sitter Restrictions Weight Bearing Restrictions: No      Mobility  Bed Mobility Overal bed mobility: Modified Independent             General bed mobility comments: supine to sit from flat bed without use of rails. Needed increased effort and time.     Transfers Overall transfer level: Needs assistance Equipment used: Rolling walker (2 wheeled) Transfers: Sit to/from Stand Sit to Stand: Supervision         General transfer comment: Ambulated in hallway with RW and Min guard for safety.  Ambulation/Gait Ambulation/Gait assistance: Min guard Gait Distance (Feet): 150 Feet Assistive device: Rolling walker (2 wheeled) Gait Pattern/deviations: Step-through pattern        Stairs            Wheelchair Mobility    Modified Rankin (Stroke Patients Only)       Balance Overall balance assessment: History of Falls;Mild deficits observed, not formally tested                                           Pertinent Vitals/Pain Pain Assessment: No/denies pain Faces Pain Scale: No hurt    Home Living Family/patient expects to be discharged to:: Skilled nursing facility Living Arrangements: Alone Available Help at Discharge: Available PRN/intermittently;Friend(s) Type of Home: House Home Access: Stairs to enter Entrance Stairs-Rails: Psychiatric nurse of Steps: 3-4 Home Layout: One level Home Equipment: Walker - 2 wheels;Cane - single point;Grab bars - toilet Additional Comments: Previous admission a year ago pt reported having wheelchair and BSC, but pt denied any more DME than above.? if providing accurate info, not oriented to palce and time    Prior Function Level of Independence: Needs assistance   Gait / Transfers Assistance Needed: Pt rpeorts usually  ambulates without AD. Endorses 1 fall in past year.  ADL's / Homemaking Assistance Needed: Per neighbor report, pt unable to care for self or home adequately.  Currently pt does not drive and reports depending on neighbor to either bring her groceries or take her to store. Per chart reivew and neighbor report, pt had a dog who died of starvation, and has several feral cats in the house which is very poorly maintained.        Hand  Dominance        Extremity/Trunk Assessment   Upper Extremity Assessment Upper Extremity Assessment: Generalized weakness    Lower Extremity Assessment Lower Extremity Assessment: Generalized weakness    Cervical / Trunk Assessment Cervical / Trunk Assessment: Kyphotic  Communication   Communication: No difficulties  Cognition Arousal/Alertness: Awake/alert Behavior During Therapy: WFL for tasks assessed/performed Overall Cognitive Status: History of cognitive impairments - at baseline Area of Impairment: Memory;Safety/judgement                     Memory: Decreased short-term memory   Safety/Judgement: Decreased awareness of safety     General Comments: Pt is oriented to self only this morning.      General Comments      Exercises     Assessment/Plan    PT Assessment Patient needs continued PT services  PT Problem List Decreased activity tolerance;Decreased cognition;Decreased knowledge of precautions       PT Treatment Interventions DME instruction;Gait training;Functional mobility training;Therapeutic activities    PT Goals (Current goals can be found in the Care Plan section)  Acute Rehab PT Goals Patient Stated Goal: agreed to walk PT Goal Formulation: Patient unable to participate in goal setting Time For Goal Achievement: 01/27/21 Potential to Achieve Goals: Good    Frequency Min 2X/week   Barriers to discharge        Co-evaluation PT/OT/SLP Co-Evaluation/Treatment: Yes Reason for Co-Treatment: For patient/therapist safety PT goals addressed during session: Mobility/safety with mobility OT goals addressed during session: ADL's and self-care       AM-PAC PT "6 Clicks" Mobility  Outcome Measure Help needed turning from your back to your side while in a flat bed without using bedrails?: None Help needed moving from lying on your back to sitting on the side of a flat bed without using bedrails?: None Help needed moving to and from a  bed to a chair (including a wheelchair)?: A Little Help needed standing up from a chair using your arms (e.g., wheelchair or bedside chair)?: A Little Help needed to walk in hospital room?: A Little Help needed climbing 3-5 steps with a railing? : A Little 6 Click Score: 20    End of Session Equipment Utilized During Treatment: Gait belt Activity Tolerance: Patient tolerated treatment well Patient left: in chair;with call bell/phone within reach;with nursing/sitter in room Nurse Communication: Mobility status PT Visit Diagnosis: Difficulty in walking, not elsewhere classified (R26.2)    Time: 6384-5364 PT Time Calculation (min) (ACUTE ONLY): 17 min   Charges:   PT Evaluation $PT Eval Low Complexity: [facility] Acute Rehabilitation Services Pager [phone] Office [phone]   Claretha Cooper 01/13/2021, 12:37 PM

## 2021-01-13 NOTE — Evaluation (Signed)
Occupational Therapy Evaluation Patient Details Name: [name] [name] MRN: [id number] DOB: Sep 07, 1930 Today's Date: 01/13/2021    History of Present Illness [name] [name] is a 87 y.o. female with medical history significant of dementia, anemia, thoracic aortic aneurysm, aortic insufficiency status post AVR and aortoplasty in 2013, history of colon cancer status post surgery in 1973, depression, anxiety, hypertension, hyperlipidemia, paroxysmal A. Fib presented to the ED via EMS for evaluation of bilateral lower extremity redness and swelling after one of her feral cats scratched her leg per neighbor report. Bilateral lower extremity Dopplers were done and revealed acute DVT involving one of the paired veins of the left posterior tibial veins and left peroneal veins. Pt also dx with LE cellulitis. Pt's neighbor is also requesting to keep her on suicide watch as she has threatened to kill herself multiple times by stating she will shoot herself or use a blade to cut herself.   Clinical Impression   Patient is currently requiring assistance with ADLs including minimal assist with toileting, LE dressing, and with bathing, and min guard assist to supervision with feeding, grooming and UE dressing.  Patient's typical baseline is likely not independent with ADLs due to reports of pt being unkempt and home in poor condition prior to admission. During this evaluation, patient was limited by dementia/pleasantly confused, generalized weakness, and areas of self-neglect including overgrown toe nails which required assistance for dressing, all of which has the potential to impact patient's safety and independence during functional mobility, as well as performance for ADLs. Grant "6-clicks" Daily Activity Inpatient Short Form score of 18/24 indicates 46.65% ADL impairment this session. Patient lives alone with 1 neighbor, who provides some assistance with groceries and transportation needs.  Otherwise, pt has no help available locally.  Patient demonstrates fair rehab potential, and should benefit from continued skilled occupational therapy services while in acute care to maximize safety, independence and quality of life at home.  Continued occupational therapy services in a SNF setting prior to long term residential care is recommended as pt is unable to adequately care for herself.  ?    Follow Up Recommendations  SNF (SNF with bridge to long term residential care.)    Equipment Recommendations       Recommendations for Other Services       Precautions / Restrictions Precautions Precautions: Fall Precaution Comments: Suicide precautions with 1:1 sitter Restrictions Weight Bearing Restrictions: No      Mobility Bed Mobility Overal bed mobility: Modified Independent             General bed mobility comments: supine to sit from flat bed without use of rails. Needed increased effort and time. Patient Response: Cooperative  Transfers Overall transfer level: Needs assistance Equipment used: Rolling walker (2 wheeled) Transfers: Sit to/from Stand Sit to Stand: Supervision         General transfer comment: Ambulated in hallway with RW and Min guard for safety.    Balance Overall balance assessment: History of Falls;Mild deficits observed, not formally tested                                         ADL either performed or assessed with clinical judgement   ADL Overall ADL's : Needs assistance/impaired Eating/Feeding: Supervision/ safety   Grooming: Sitting;Set up;Supervision/safety   Upper Body Bathing: Min guard;Sitting   Lower Body Bathing: Minimal assistance;Sit to/from stand;Sitting/lateral  leans   Upper Body Dressing : Min guard;Sitting   Lower Body Dressing: Minimal assistance Lower Body Dressing Details (indicate cue type and reason): Overgorn toe nails with pt struggling to don socks over feet. Required Min As with backward  chaining to get socks started, then pt able to pull up. Toilet Transfer: Public affairs consultant and Hygiene: Minimal assistance;Sitting/lateral lean;Sit to/from stand   Scientist, research (medical): Minimal assistance   Functional mobility during ADLs: Min guard;Rolling walker       Vision   Vision Assessment?: No apparent visual deficits     Perception     Praxis      Pertinent Vitals/Pain Pain Assessment: No/denies pain Faces Pain Scale: No hurt     Hand Dominance     Extremity/Trunk Assessment Upper Extremity Assessment Upper Extremity Assessment: Generalized weakness   Lower Extremity Assessment Lower Extremity Assessment: Defer to PT evaluation   Cervical / Trunk Assessment Cervical / Trunk Assessment: Kyphotic   Communication Communication Communication: No difficulties   Cognition Arousal/Alertness: Awake/alert Behavior During Therapy: WFL for tasks assessed/performed Overall Cognitive Status: History of cognitive impairments - at baseline Area of Impairment: Memory;Safety/judgement                     Memory: Decreased short-term memory   Safety/Judgement: Decreased awareness of safety     General Comments: Pt is oriented to self only this morning.   General Comments       Exercises     Shoulder Instructions      Home Living Family/patient expects to be discharged to:: Skilled nursing facility Living Arrangements: Alone;Other (Comment) (Only support system is a friend in [place] and a neighbor.) Available Help at Discharge: Available PRN/intermittently;Friend(s) Type of Home: House Home Access: Stairs to enter (Pt reports ramp to home a year ago on previous admission. Pt is an unreliable historian due to dementia and confused with some questions.) Entrance Stairs-Number of Steps: 3-4 Entrance Stairs-Rails: Right;Left Home Layout: One level     Bathroom Shower/Tub: Teacher, early years/pre:  Standard     Home Equipment: Environmental consultant - 2 wheels;Cane - single point;Grab bars - toilet   Additional Comments: Previous admission a year ago pt reported having wheelchair and BSC, but pt denied any more DME than above.      Prior Functioning/Environment Level of Independence: Needs assistance  Gait / Transfers Assistance Needed: Pt rpeorts usually ambulates without AD. Endorses 1 fall in past year. ADL's / Homemaking Assistance Needed: Per neighbor report, pt unable to care for self or home adequately.  Currently pt does not drive and reports depending on neighbor to either bring her groceries or take her to store. Per chart reivew and neighbor report, pt had a dog who died of starvation, and has several feral cats in the house which is very poorly maintained.            OT Problem List: Decreased strength;Impaired balance (sitting and/or standing);Decreased cognition;Decreased safety awareness;Decreased activity tolerance;Decreased knowledge of use of DME or AE;Decreased knowledge of precautions      OT Treatment/Interventions: Self-care/ADL training;Therapeutic exercise;Therapeutic activities;Cognitive remediation/compensation;DME and/or AE instruction;Patient/family education;Balance training    OT Goals(Current goals can be found in the care plan section) Acute Rehab OT Goals OT Goal Formulation: Patient unable to participate in goal setting ADL Goals Pt Will Perform Grooming: standing;with supervision Pt Will Perform Lower Body Dressing: with supervision;sit to/from stand Pt Will Transfer to Toilet: with supervision;ambulating Pt  Will Perform Toileting - Clothing Manipulation and hygiene: with modified independence;sitting/lateral leans;sit to/from stand Pt/caregiver will Perform Home Exercise Program: Increased strength;Both right and left upper extremity;With Supervision Additional ADL Goal #1: Pt will engage in 5 min standing functional activities without loss of balance, in  order to demonstrate improved activity tolerance and balance needed to perform ADLs safely at home.  OT Frequency: Min 2X/week   Barriers to D/C: Decreased caregiver support  Home in poor shape per chart review.       Co-evaluation PT/OT/SLP Co-Evaluation/Treatment: Yes Reason for Co-Treatment: For patient/therapist safety   OT goals addressed during session: ADL's and self-care      AM-PAC OT "6 Clicks" Daily Activity     Outcome Measure Help from another person eating meals?: A Little Help from another person taking care of personal grooming?: A Little Help from another person toileting, which includes using toliet, bedpan, or urinal?: A Little Help from another person bathing (including washing, rinsing, drying)?: A Little Help from another person to put on and taking off regular upper body clothing?: A Little Help from another person to put on and taking off regular lower body clothing?: A Little 6 Click Score: 18   End of Session Equipment Utilized During Treatment: Gait belt;Rolling walker Nurse Communication:  (1:1 sitter observing.)  Activity Tolerance: Patient tolerated treatment well Patient left: in chair;with call bell/phone within reach;with chair alarm set;with nursing/sitter in room  OT Visit Diagnosis: Unsteadiness on feet (R26.81);Other symptoms and signs involving cognitive function;History of falling (Z91.81);Muscle weakness (generalized) (M62.81)                Time: 0962-8366 OT Time Calculation (min): 16 min Charges:  OT General Charges $OT Visit: 1 Visit OT Evaluation $OT Eval Low Complexity: [place], OT Acute Rehab Services Office: [phone] 01/13/2021  Julien Girt 01/13/2021, 10:24 AM

## 2021-01-13 NOTE — TOC Initial Note (Signed)
Transition of Care [facility]) - Initial/Assessment Note    Patient Details  Name: [name] [name] MRN: [id number] Date of Birth: 12-29-1929  Transition of Care [facility]) CM/SW Contact:    Trish Mage, LCSW Phone Number: 01/13/2021, 3:13 PM  Clinical Narrative:   Patient seen in follow up to MD consult re: need for placement.  Apparently, [name] [name] lives at home alone, has some degree of dementia, has no local relatives but does have a supportive neighbor.  It would appear that she is unable to care for herself, but is ambulatory without a skillable need. [name] [name] also has [employer] as a payor source. Bed search initiated for LTC ALF/memory care level.  TOC will continue to follow during the course of hospitalization.                  Expected Discharge Plan: Skilled Nursing Facility Barriers to Discharge: SNF Pending bed offer   Patient Goals and CMS Choice     Choice offered to / list presented to : Patient  Expected Discharge Plan and Services Expected Discharge Plan: [place]   Discharge Planning Services: CM Consult Post Acute Care Choice: [place] Living arrangements for the past 2 months: Single Family Home                                      Prior Living Arrangements/Services Living arrangements for the past 2 months: Single Family Home Lives with:: Self Patient language and need for interpreter reviewed:: Yes        Need for Family Participation in Patient Care: Yes (Comment) Care giver support system in place?: Yes (comment)   Criminal Activity/Legal Involvement Pertinent to Current Situation/Hospitalization: No - Comment as needed  Activities of Daily Living Home Assistive Devices/Equipment: None ADL Screening (condition at time of admission) Patient's cognitive ability adequate to safely complete daily activities?: No Is the patient deaf or have difficulty hearing?: Yes Does the patient have difficulty seeing, even when  wearing glasses/contacts?: No Does the patient have difficulty concentrating, remembering, or making decisions?: No Patient able to express need for assistance with ADLs?: Yes Does the patient have difficulty dressing or bathing?: Yes Independently performs ADLs?: No Communication: Independent Dressing (OT): Needs assistance Is this a change from baseline?: Pre-admission baseline Grooming: Needs assistance Is this a change from baseline?: Pre-admission baseline Feeding: Independent Bathing: Needs assistance Is this a change from baseline?: Pre-admission baseline Toileting: Needs assistance Is this a change from baseline?: Pre-admission baseline In/Out Bed: Needs assistance Is this a change from baseline?: Pre-admission baseline Walks in Home: Independent Does the patient have difficulty walking or climbing stairs?: Yes Weakness of Legs: Both Weakness of Arms/Hands: None  Permission Sought/Granted                  Emotional Assessment Appearance:: Appears stated age Attitude/Demeanor/Rapport: Engaged Affect (typically observed): Unable to Assess Orientation: : Oriented to Self Alcohol / Substance Use: Not Applicable Psych Involvement: No (comment)  Admission diagnosis:  Cellulitis of right leg [L03.115] Acute DVT (deep venous thrombosis) (HCC) [I82.409] Acute deep vein thrombosis (DVT) of left peroneal vein ([place]) [I82.452] Patient Active Problem List   Diagnosis Date Noted  . Acute DVT (deep venous thrombosis) ([place]) 01/12/2021  . Cellulitis 01/12/2021  . Dementia ([place]) 01/12/2021  . DDD (degenerative disc disease), cervical 12/23/2018  . Chest pain 08/12/2017  . Dyspnea 08/12/2017  .  Acute blood loss anemia 03/06/2013  . Anxiety state 03/06/2013  . Depression 03/06/2013  . Unspecified constipation 03/06/2013  . Fracture tibia/fibula 12/02/2012  . Hypokalemia 12/02/2012  . S/P AVR (aortic valve replacement) and aortoplasty 09/30/2012  . HTN (hypertension)  09/30/2012  . Hypertension   . Hypercholesterolemia   . Paroxysmal atrial fibrillation (HCC)   . Aortic aneurysm, thoracic ([place])   . Aortic insufficiency    PCP:  Deland Pretty, MD Pharmacy:   [facility] [address] [place] [address] Phone: [phone] Fax: [phone]     Social Determinants of Health (SDOH) Interventions    Readmission Risk Interventions No flowsheet data found.

## 2021-01-13 NOTE — NC FL2 (Signed)
[place] MEDICAID FL2 LEVEL OF CARE SCREENING TOOL     IDENTIFICATION  Patient Name: [name] [name] Birthdate: 1930/02/26 Sex: female Admission Date (Current Location): 01/12/2021  [facility] and [place] Number:  Herbalist and Address:  [facility],  [place] [address], [place]      Provider Number: [id number]  Attending Physician Name and Address:  Mercy Riding, MD  Relative Name and Phone Number:  Glenford Bayley) Mikki Santee (Other)   [phone]    Current Level of Care: Hospital Recommended Level of Care: [place] Prior Approval Number:    Date Approved/Denied:   PASRR Number: [id number]  Discharge Plan: SNF    Current Diagnoses: Patient Active Problem List   Diagnosis Date Noted  . Acute DVT (deep venous thrombosis) ([place]) 01/12/2021  . Cellulitis 01/12/2021  . Dementia ([place]) 01/12/2021  . DDD (degenerative disc disease), cervical 12/23/2018  . Chest pain 08/12/2017  . Dyspnea 08/12/2017  . Acute blood loss anemia 03/06/2013  . Anxiety state 03/06/2013  . Depression 03/06/2013  . Unspecified constipation 03/06/2013  . Fracture tibia/fibula 12/02/2012  . Hypokalemia 12/02/2012  . S/P AVR (aortic valve replacement) and aortoplasty 09/30/2012  . HTN (hypertension) 09/30/2012  . Hypertension   . Hypercholesterolemia   . Paroxysmal atrial fibrillation (HCC)   . Aortic aneurysm, thoracic ([place])   . Aortic insufficiency     Orientation RESPIRATION BLADDER Height & Weight     Self  Normal External catheter Weight: 57.6 kg Height:  5\' 4"  (162.6 cm)  BEHAVIORAL SYMPTOMS/MOOD NEUROLOGICAL BOWEL NUTRITION STATUS   (none)  (none) Incontinent Diet (see d/c summary)  AMBULATORY STATUS COMMUNICATION OF NEEDS Skin   Extensive Assist Verbally Normal                       Personal Care Assistance Level of Assistance  Bathing,Feeding,Dressing Bathing Assistance: Maximum assistance Feeding assistance:  Independent Dressing Assistance: Limited assistance     Functional Limitations Info  Sight,Hearing,Speech Sight Info: Adequate Hearing Info: Adequate Speech Info: Adequate    SPECIAL CARE FACTORS FREQUENCY  PT (By licensed PT),OT (By licensed OT)     PT Frequency: 5X/W OT Frequency: 5X/W            Contractures Contractures Info: Not present    Additional Factors Info  Code Status,Allergies Code Status Info: Full Allergies Info: Codeine           Current Medications (01/13/2021):  This is the current hospital active medication list Current Facility-Administered Medications  Medication Dose Route Frequency Provider Last Rate Last Admin  . acetaminophen (TYLENOL) tablet 650 mg  650 mg Oral Q6H PRN Shela Leff, MD       Or  . acetaminophen (TYLENOL) suppository 650 mg  650 mg Rectal Q6H PRN Shela Leff, MD      . apixaban (ELIQUIS) tablet 10 mg  10 mg Oral BID Shela Leff, MD   10 mg at 01/13/21 1107   Followed by  . [START ON 01/19/2021] apixaban (ELIQUIS) tablet 5 mg  5 mg Oral BID Shela Leff, MD      . doxycycline (VIBRA-TABS) tablet 100 mg  100 mg Oral Q12H Shela Leff, MD   100 mg at 01/13/21 0504  . escitalopram (LEXAPRO) tablet 20 mg  20 mg Oral Daily Suella Broad, FNP   20 mg at 01/13/21 1117  . furosemide (LASIX) injection 20 mg  20 mg Intravenous Daily Shela Leff, MD  20 mg at 01/13/21 1108  . potassium chloride SA (KLOR-CON) CR tablet 40 mEq  40 mEq Oral Q3H Mercy Riding, MD   40 mEq at 01/13/21 1107     Discharge Medications: Please see discharge summary for a list of discharge medications.  Relevant Imaging Results:  Relevant Lab Results:   Additional Information [place], [place]

## 2021-01-14 DIAGNOSIS — I82452 Acute embolism and thrombosis of left peroneal vein: Secondary | ICD-10-CM | POA: Diagnosis not present

## 2021-01-14 DIAGNOSIS — I10 Essential (primary) hypertension: Secondary | ICD-10-CM | POA: Diagnosis not present

## 2021-01-14 DIAGNOSIS — G301 Alzheimer's disease with late onset: Secondary | ICD-10-CM | POA: Diagnosis not present

## 2021-01-14 DIAGNOSIS — F0281 Dementia in other diseases classified elsewhere with behavioral disturbance: Secondary | ICD-10-CM

## 2021-01-14 DIAGNOSIS — I824Y2 Acute embolism and thrombosis of unspecified deep veins of left proximal lower extremity: Secondary | ICD-10-CM | POA: Diagnosis not present

## 2021-01-14 DIAGNOSIS — L03116 Cellulitis of left lower limb: Secondary | ICD-10-CM

## 2021-01-14 DIAGNOSIS — I5033 Acute on chronic diastolic (congestive) heart failure: Secondary | ICD-10-CM

## 2021-01-14 LAB — RENAL FUNCTION PANEL
Albumin: 2.5 g/dL — ABNORMAL LOW (ref 3.5–5.0)
Anion gap: 7 (ref 5–15)
BUN: 15 mg/dL (ref 8–23)
CO2: 26 mmol/L (ref 22–32)
Calcium: 8.2 mg/dL — ABNORMAL LOW (ref 8.9–10.3)
Chloride: 102 mmol/L (ref 98–111)
Creatinine, Ser: 0.76 mg/dL (ref 0.44–1.00)
GFR, Estimated: >60 mL/min (ref >60)
Glucose, Bld: 85 mg/dL (ref 70–99)
Phosphorus: 3 mg/dL (ref 2.5–4.6)
Potassium: 4.1 mmol/L (ref 3.5–5.1)
Sodium: 135 mmol/L (ref 135–145)

## 2021-01-14 LAB — CBC
HCT: 32.8 % — ABNORMAL LOW (ref 36.0–46.0)
Hemoglobin: 10.7 g/dL — ABNORMAL LOW (ref 12.0–15.0)
MCH: 28.8 pg (ref 26.0–34.0)
MCHC: 32.6 g/dL (ref 30.0–36.0)
MCV: 88.2 fL (ref 80.0–100.0)
Platelets: 231 10*3/uL (ref 150–400)
RBC: 3.72 MIL/uL — ABNORMAL LOW (ref 3.87–5.11)
RDW: 15.9 % — ABNORMAL HIGH (ref 11.5–15.5)
WBC: 9 10*3/uL (ref 4.0–10.5)
nRBC: 0 % (ref 0.0–0.2)

## 2021-01-14 LAB — MAGNESIUM: Magnesium: 2 mg/dL (ref 1.7–2.4)

## 2021-01-14 LAB — FERRITIN: Ferritin: 53 ng/mL (ref 11–307)

## 2021-01-14 LAB — RETICULOCYTES
Immature Retic Fract: 15 % (ref 2.3–15.9)
RBC.: 3.8 MIL/uL — ABNORMAL LOW (ref 3.87–5.11)
Retic Count, Absolute: 62.3 10*3/uL (ref 19.0–186.0)
Retic Ct Pct: 1.6 % (ref 0.4–3.1)

## 2021-01-14 LAB — TSH: TSH: 3.395 u[IU]/mL (ref 0.350–4.500)

## 2021-01-14 LAB — VITAMIN B12: Vitamin B-12: 208 pg/mL (ref 180–914)

## 2021-01-14 LAB — IRON AND TIBC
Iron: 73 ug/dL (ref 28–170)
Saturation Ratios: 45 % — ABNORMAL HIGH (ref 10.4–31.8)
TIBC: 164 ug/dL — ABNORMAL LOW (ref 250–450)
UIBC: 91 ug/dL

## 2021-01-14 LAB — FOLATE: Folate: 4.7 ng/mL — ABNORMAL LOW (ref >5.9)

## 2021-01-14 MED ORDER — FOLIC ACID 1 MG PO TABS
1.0000 mg | ORAL_TABLET | Freq: Every day | ORAL | Status: DC
  Administered 2021-01-14 – 2021-01-16 (×3): 1 mg via ORAL
  Filled 2021-01-14 (×3): qty 1

## 2021-01-14 MED ORDER — POLYVINYL ALCOHOL 1.4 % OP SOLN
1.0000 [drp] | OPHTHALMIC | Status: DC | PRN
  Administered 2021-01-14: 1 [drp] via OPHTHALMIC
  Filled 2021-01-14: qty 15

## 2021-01-14 MED ORDER — CYANOCOBALAMIN 1000 MCG/ML IJ SOLN
1000.0000 ug | Freq: Once | INTRAMUSCULAR | Status: AC
  Administered 2021-01-14: 1000 ug via INTRAMUSCULAR
  Filled 2021-01-14: qty 1

## 2021-01-14 MED ORDER — VITAMIN B-12 1000 MCG PO TABS: 1000.0000 ug | ORAL_TABLET | Freq: Every day | ORAL | Status: DC

## 2021-01-14 MED ORDER — FUROSEMIDE 20 MG PO TABS
20.0000 mg | ORAL_TABLET | Freq: Every day | ORAL | Status: DC
  Administered 2021-01-14 – 2021-01-16 (×3): 20 mg via ORAL
  Filled 2021-01-14 (×3): qty 1

## 2021-01-14 NOTE — TOC Progression Note (Signed)
Transition of Care [facility]) - Progression Note    Patient Details  Name: ZYRIAH MASK MRN: [id number] Date of Birth: 05-20-1930  Transition of Care [facility]) CM/SW Contact  Joaquin Courts, RN Phone Number: 01/14/2021, 9:08 AM  Clinical Narrative:    Cm reached out to neighbor Mikki Santee, who reports he checks on the patient especially when her cousin who lives in [place] is unable to reach her.  Mikki Santee reports patient's dementia has been getting worse, she lives alone and has no family in town, reports the home is very dirty with feral cats and feces.  Reports patient has been very locking doors making it difficult to check on her, reports she has been falling, believes she receives meals on wheels, states he has seen the containers in her trash can.  Mikki Santee was able to provide CM with contact number for [place], who is married to patient's cousin in [place] [phone]).  CM spoke with [place], who reports that she has been trying to help patient and has worked in the past with DSS and was able to get her medicaid, but feels patient has deteriorated to the point that she can no longer live safely alone.  [facility] reports they are unable to take patient in and care for her, reports no other family available (patient does have some step children, who are the children of her deceased spouse but they are not involved with patient in any way).  Per [place] patient has no assets, other than the home she owns which has a reverse mortgage and a Control and instrumentation engineer monthly for [phone].  [facility] does state that she would be willing to sign any paperwork necessary for patient to admit to facility.  CM discussed the placement process including need for insurance auth as medicare will cover only short term rehab stays, patient ambulated 120 feet with min guard assist, not recommended for rehab at this time.  Patient has no support system at home and is only oriented to self at this time.  Per [place] reports patient often  states other people are in her home such as her mother, who is deceased.   CM reached out to [facility] and [facility], both facilities accept medicaid patients and have open female beds.  Placement into ALF/memory care appears to be safest disposition option at this time.    Expected Discharge Plan: Skilled Nursing Facility Barriers to Discharge: SNF Pending bed offer  Expected Discharge Plan and Services Expected Discharge Plan: [place]   Discharge Planning Services: CM Consult Post Acute Care Choice: [place] Living arrangements for the past 2 months: Single Family Home                                       Social Determinants of Health (SDOH) Interventions    Readmission Risk Interventions No flowsheet data found.

## 2021-01-14 NOTE — Progress Notes (Signed)
TOC CSW received information from [facility]/DSS APS.  CSW gave [name] [name]'s contact information so he cld speak with him in regards to pts situation.  Deval Mroczka Tarpley-Carter, MSW, LCSW-A Pronouns:  She, Her, Hers                  [facility] ED Transitions of CareClinical Social Worker [email or web address] [phone]

## 2021-01-14 NOTE — [place] FL2 (Signed)
[place] MEDICAID FL2 LEVEL OF CARE SCREENING TOOL     IDENTIFICATION  Patient Name: [name] [name] Birthdate: September 12, 1930 Sex: female Admission Date (Current Location): 01/12/2021  [facility] and [place] Number:  Herbalist and Address:  [facility],  [place] [address], [place]      Provider Number: [id number]  Attending Physician Name and Address:  Mercy Riding, MD  Relative Name and Phone Number:  Glenford Bayley) Mikki Santee (Other)   [phone]    Current Level of Care: Hospital Recommended Level of Care: Assisted Living [facility] Prior Approval Number:    Date Approved/Denied:   PASRR Number: [id number]  Discharge Plan: Domiciliary (Rest home)    Current Diagnoses: Patient Active Problem List   Diagnosis Date Noted  . Acute DVT (deep venous thrombosis) ([place]) 01/12/2021  . Cellulitis 01/12/2021  . Dementia ([place]) 01/12/2021  . DDD (degenerative disc disease), cervical 12/23/2018  . Chest pain 08/12/2017  . Dyspnea 08/12/2017  . Acute blood loss anemia 03/06/2013  . Anxiety state 03/06/2013  . Depression 03/06/2013  . Unspecified constipation 03/06/2013  . Fracture tibia/fibula 12/02/2012  . Hypokalemia 12/02/2012  . S/P AVR (aortic valve replacement) and aortoplasty 09/30/2012  . HTN (hypertension) 09/30/2012  . Hypertension   . Hypercholesterolemia   . Paroxysmal atrial fibrillation (HCC)   . Aortic aneurysm, thoracic ([place])   . Aortic insufficiency     Orientation RESPIRATION BLADDER Height & Weight     Self  Normal Continent (has "accidents" at times) Weight: 57.6 kg Height:  5\' 4"  (162.6 cm)  BEHAVIORAL SYMPTOMS/MOOD NEUROLOGICAL BOWEL NUTRITION STATUS   (none)  (none) Continent (has "accidents" at times) Diet (see d/c summary)  AMBULATORY STATUS COMMUNICATION OF NEEDS Skin   Supervision Verbally Normal                       Personal Care Assistance Level of Assistance  Bathing,Feeding,Dressing  Bathing Assistance: Limited assistance Feeding assistance: Independent Dressing Assistance: Independent     Functional Limitations Info  Sight,Hearing,Speech Sight Info: Adequate Hearing Info: Adequate Speech Info: Adequate    SPECIAL CARE FACTORS FREQUENCY  PT (By licensed PT),OT (By licensed OT)     PT Frequency: 5X/W OT Frequency: 5X/W            Contractures Contractures Info: Not present    Additional Factors Info  Code Status,Allergies Code Status Info: Full Allergies Info: Codeine           Current Medications (01/14/2021):  This is the current hospital active medication list Current Facility-Administered Medications  Medication Dose Route Frequency Provider Last Rate Last Admin  . acetaminophen (TYLENOL) tablet 650 mg  650 mg Oral Q6H PRN Shela Leff, MD       Or  . acetaminophen (TYLENOL) suppository 650 mg  650 mg Rectal Q6H PRN Shela Leff, MD      . apixaban (ELIQUIS) tablet 10 mg  10 mg Oral BID Shela Leff, MD   10 mg at 01/14/21 0946   Followed by  . [START ON 01/19/2021] apixaban (ELIQUIS) tablet 5 mg  5 mg Oral BID Shela Leff, MD      . doxycycline (VIBRA-TABS) tablet 100 mg  100 mg Oral Q12H Shela Leff, MD   100 mg at 01/14/21 0946  . escitalopram (LEXAPRO) tablet 20 mg  20 mg Oral Daily Suella Broad, FNP   20 mg at 01/14/21 0946  . folic acid (FOLVITE) tablet 1 mg  1 mg Oral Daily Wendee Beavers T, MD   1 mg at 01/14/21 0946  . furosemide (LASIX) injection 20 mg  20 mg Intravenous Daily Shela Leff, MD   20 mg at 01/14/21 0946  . [START ON 01/15/2021] vitamin B-12 (CYANOCOBALAMIN) tablet 1,000 mcg  1,000 mcg Oral Daily Mercy Riding, MD         Discharge Medications: Please see discharge summary for a list of discharge medications.  Relevant Imaging Results:  Relevant Lab Results:   Additional Information [place], [place]

## 2021-01-14 NOTE — TOC Progression Note (Addendum)
Transition of Care [facility]) - Progression Note    Patient Details  Name: KRISTYNE WOODRING MRN: [id number] Date of Birth: 1929-12-21  Transition of Care [facility] CM/SW [place], [place] Phone Number: 01/14/2021, 3:03 PM  Clinical Narrative:   Patient's information was sent to [facility], [place] and [place].  Heard back from [place] at [place] who made bed offer pending signing of paperwork by cousin via email.  Messaged MD to order COVID test. TOC will continue to follow during the course of hospitalization.  AddendumMendel Ryder confirmed receiving paperwork back-they are ready for patient. We are awaiting the results of the test which had to be administered this AM as the one from yesterday was inconclusive or contaminated or somehow rendered unusable.    Expected Discharge Plan: Skilled Nursing Facility Barriers to Discharge: SNF Pending bed offer  Expected Discharge Plan and Services Expected Discharge Plan: [place]   Discharge Planning Services: CM Consult Post Acute Care Choice: Standard Living arrangements for the past 2 months: Single Family Home                                       Social Determinants of Health (SDOH) Interventions    Readmission Risk Interventions No flowsheet data found.

## 2021-01-14 NOTE — Progress Notes (Signed)
PROGRESS NOTE  NEZIAH VOGELGESANG WRU:[id number] DOB: May 03, 1930   PCP: Deland Pretty, MD  Patient is from: Home.  Lives alone with 3 cats.  No family members close by.  DOA: 01/12/2021 LOS: 0  Chief complaints: Leg swelling and redness  Brief Narrative / Interim history: 87 year old F with PMH of dementia, thoracic aortic aneurysm, aortic insufficiency s/p AVR and aortoplasty in 2013, paroxysmal A. Fib not on AC, remote colon cancer in 1973, HTN, HLD, anxiety and depression brought to ED by EMS after neighbors called EMS out of concern for bilateral lower extremity swelling and redness, poor living situation and suicidality.  In ED, vitals, basic labs and CXR without acute significant finding.  BNP 288.  LE US showed DVT in left posterior tibial and left peroneal veins.  Patient was started on p.o. Eliquis for DVT, doxycycline for possible cellulitis from cat scratch and IV Lasix for leg swelling and possible acute CHF.  Clinically improved.  Psychiatry consulted and cleared patient.  Therapy recommended SNF.  Waiting on SNF bed.  Subjective: Seen and examined earlier this morning.  No major events overnight or this morning.  She had some sundowning yesterday. She is awake but only oriented to self today.  She is asking me how I got into her room with the doors locked.  No complaints.  She responds no to pain, shortness of breath, GI or UTI symptoms.   Objective: Vitals:   01/13/21 1139 01/13/21 2049 01/14/21 0401 01/14/21 1318  BP: 139/82 (!) 146/83 (!) 155/74 116/68  Pulse: 91 86 84 89  Resp: 16 16 16 14   Temp: 99 F (37.2 C) 98.5 F (36.9 C) 98.2 F (36.8 C) 98 F (36.7 C)  TempSrc: Oral Oral Oral Oral  SpO2: 97% 99% 95% 95%  Weight:      Height:        Intake/Output Summary (Last 24 hours) at 01/14/2021 1514 Last data filed at 01/14/2021 1242 Gross per 24 hour  Intake 630 ml  Output --  Net 630 ml   Filed Weights   01/12/21 2124  Weight: 57.6 kg     Examination:  GENERAL: No apparent distress.  Nontoxic. HEENT: MMM.  Vision and hearing grossly intact.  NECK: Supple.  No apparent JVD.  RESP: On RA.  No IWOB.  Fair aeration bilaterally. CVS:  RRR. Heart sounds normal.  ABD/GI/GU: BS+. Abd soft, NTND.  MSK/EXT:  Moves extremities.Trace edema.  Significant muscle mass and subcu fat loss.  Enlarged toenails. SKIN: Small skin laceration from cat scratch her left leg.  Erythema improved. NEURO: Awake.  Oriented only to self.  Follows commands.  No apparent focal neuro deficit. PSYCH: Calm.  No distress or agitation.  Procedures:  None  Microbiology summarized: COVID-19 PCR nonreactive.  Assessment & Plan: Acute left lower extremity DVT involving one of the paired veins of the left posterior tibial veins and left peroneal veins.  -On a starter pack Eliquis. -Low suspicion for large PE  Left lower extremity cellulitis in the setting of cat scratch: No systemic symptoms or leukocytosis to suggest sepsis.  Improving. -Continue p.o. doxycycline  Dementia with behavioral disturbance/poor living situation-only oriented to self today.   -Reorientation and delirium and fall precautions -Continue home Aricept  Elevated BNP: TTE with LVEF of 55 to 60%, mild LVH and stable repaired aortic valve and graft.  BNP elevated to 288.  Personally reviewed chest x-ray without signs pulmonary vascular congestion.  Edema improved.  Appears euvolemic.  5 unmeasured voids  charted.  Creatinine is stable. -Change to p.o. Lasix 20 mg daily -Monitor fluid status and renal function  History of anxiety and depression: Stable.  She denies suicidal ideation.  Per H&P, concern came from patient's neighbor who called EMS.  Appreciate input by psychiatry. -Safety sitter discontinued on 4/4. -Continue home Lexapro  Hypertension: Stable.   -Hold home amlodipine in the setting of edema -Lasix as above  Paroxysmal A. Fib?  She is in sinus rhythm.  Not on  [facility] of medications at home. -Optimize K and Mg -Now on Eliquis primarily for DVT.  Hyponatremia: Hypervolemic?  Resolved.  Hypokalemia: Resolved.  Normocytic anemia: Stable.  Anemia panel with folic acid deficiency Recent Labs    01/12/21 1727 01/13/21 0305 01/14/21 0401  HGB 11.2* 02.5* 42.7*  -Folic acid and vitamin B12 supplementation  Enlarged and hypertrophic toenails -Needs outpatient podiatry for trimming.  Debility/physical deconditioning: Patient lives alone with her 3 cats with no family close by. -Therapy recommended SNF.  TOC working on this.  Body mass index is 21.8 kg/m.         DVT prophylaxis:   apixaban (ELIQUIS) tablet 10 mg  apixaban (ELIQUIS) tablet 5 mg  Code Status: Full code Family Communication: Patient and/or RN. Available if any question.  Level of care: Med-Surg Status is: Observation  The patient remains OBS appropriate and will d/c before 2 midnights.  Dispo: The patient is from: Home              Anticipated d/c is to: SNF              Patient currently is medically stable to d/c.   Difficult to place patient No           Consultants:  Psychiatry-signed off   Sch Meds:  Scheduled Meds: . apixaban  10 mg Oral BID   Followed by  . [START ON 01/19/2021] apixaban  5 mg Oral BID  . doxycycline  100 mg Oral Q12H  . escitalopram  20 mg Oral Daily  . folic acid  1 mg Oral Daily  . furosemide  20 mg Oral Daily  . [START ON 01/15/2021] vitamin B-12  1,000 mcg Oral Daily   Continuous Infusions: PRN Meds:.acetaminophen **OR** acetaminophen, polyvinyl alcohol  Antimicrobials: Anti-infectives (From admission, onward)   Start     Dose/Rate Route Frequency Ordered Stop   01/13/21 0430  doxycycline (VIBRA-TABS) tablet 100 mg        100 mg Oral Every 12 hours 01/12/21 2058     01/12/21 1630  doxycycline (VIBRA-TABS) tablet 100 mg        100 mg Oral  Once 01/12/21 1627 01/12/21 1756       I have personally reviewed the  following labs and images: CBC: Recent Labs  Lab 01/12/21 1727 01/13/21 0305 01/14/21 0401  WBC 9.1 8.7 9.0  NEUTROABS 6.9  --   --   HGB 11.2* 10.2* 10.7*  HCT 34.4* 31.3* 32.8*  MCV 88.2 87.2 88.2  PLT 208 216 231   BMP &GFR Recent Labs  Lab 01/12/21 1727 01/13/21 0305 01/14/21 0401  NA 134* 137 135  K 4.0 3.2* 4.1  CL 101 101 102  CO2 25 28 26   GLUCOSE 87 92 85  BUN 21 18 15   CREATININE 0.70 0.72 0.76  CALCIUM 8.0* 7.8* 8.2*  MG  --  1.8 2.0  PHOS  --   --  3.0   Estimated Creatinine Clearance: 42 mL/min (by C-G formula  based on SCr of 0.76 mg/dL). Liver & Pancreas: Recent Labs  Lab 01/13/21 0305 01/14/21 0401  AST 24  --   ALT 22  --   ALKPHOS 71  --   BILITOT 0.6  --   PROT 5.5*  --   ALBUMIN 2.5* 2.5*   No results for input(s): LIPASE, AMYLASE in the last 168 hours. No results for input(s): AMMONIA in the last 168 hours. Diabetic: No results for input(s): HGBA1C in the last 72 hours. No results for input(s): GLUCAP in the last 168 hours. Cardiac Enzymes: No results for input(s): CKTOTAL, CKMB, CKMBINDEX, TROPONINI in the last 168 hours. No results for input(s): PROBNP in the last 8760 hours. Coagulation Profile: No results for input(s): INR, PROTIME in the last 168 hours. Thyroid Function Tests: Recent Labs    01/14/21 0401  TSH 3.395   Lipid Profile: No results for input(s): CHOL, HDL, LDLCALC, TRIG, CHOLHDL, LDLDIRECT in the last 72 hours. Anemia Panel: Recent Labs    01/14/21 0401  VITAMINB12 208  FOLATE 4.7*  FERRITIN 53  TIBC 164*  IRON 73  RETICCTPCT 1.6   Urine analysis:    Component Value Date/Time   COLORURINE STRAW (A) 01/22/2018 1150   APPEARANCEUR CLEAR 01/22/2018 1150   LABSPEC <1.005 (L) 01/22/2018 1150   PHURINE 7.0 01/22/2018 1150   GLUCOSEU NEGATIVE 01/22/2018 1150   HGBUR NEGATIVE 01/22/2018 1150   BILIRUBINUR NEGATIVE 01/22/2018 1150   BILIRUBINUR neg 11/19/2012 0910   KETONESUR NEGATIVE 01/22/2018 1150    PROTEINUR NEGATIVE 01/22/2018 1150   UROBILINOGEN 0.2 12/02/2012 1715   NITRITE NEGATIVE 01/22/2018 1150   LEUKOCYTESUR NEGATIVE 01/22/2018 1150   Sepsis Labs: Invalid input(s): PROCALCITONIN, [place]  Microbiology: Recent Results (from the past 240 hour(s))  SARS CORONAVIRUS 2 (TAT 6-24 HRS) Nasopharyngeal Nasopharyngeal Swab     Status: None   Collection Time: 01/12/21  8:31 PM   Specimen: Nasopharyngeal Swab  Result Value Ref Range Status   SARS Coronavirus 2 NEGATIVE NEGATIVE Final    Comment: (NOTE) SARS-CoV-2 target nucleic acids are NOT DETECTED.  The SARS-CoV-2 RNA is generally detectable in upper and lower respiratory specimens during the acute phase of infection. Negative results do not preclude SARS-CoV-2 infection, do not rule out co-infections with other pathogens, and should not be used as the sole basis for treatment or other patient management decisions. Negative results must be combined with clinical observations, patient history, and epidemiological information. The expected result is Negative.  Fact Sheet for Patients: SugarRoll.be  Fact Sheet for Healthcare Providers: https://www.woods-mathews.com/  This test is not yet approved or cleared by the [place] FDA and  has been authorized for detection and/or diagnosis of SARS-CoV-2 by FDA under an Emergency Use Authorization (EUA). This EUA will remain  in effect (meaning this test can be used) for the duration of the COVID-19 declaration under Se ction 564(b)(1) of the Act, 21 U.S.C. section 360bbb-3(b)(1), unless the authorization is terminated or revoked sooner.  Performed at [facility], [place] [address] [place], [place] [address]     Radiology Studies: No results found.    Wilberth Damon T. [place]  If 7PM-7AM, please contact night-coverage [email or web address] 01/14/2021, 3:14 PM

## 2021-01-15 DIAGNOSIS — I82452 Acute embolism and thrombosis of left peroneal vein: Secondary | ICD-10-CM | POA: Diagnosis not present

## 2021-01-15 DIAGNOSIS — I824Y2 Acute embolism and thrombosis of unspecified deep veins of left proximal lower extremity: Secondary | ICD-10-CM | POA: Diagnosis not present

## 2021-01-15 LAB — SARS CORONAVIRUS 2 (TAT 6-24 HRS): SARS Coronavirus 2: NEGATIVE

## 2021-01-15 MED ORDER — FOLIC ACID 1 MG PO TABS: 1.0000 mg | ORAL_TABLET | Freq: Every day | ORAL | 0 refills | Status: AC

## 2021-01-15 MED ORDER — VITAMIN B-12 1000 MCG PO TABS
1000.0000 ug | ORAL_TABLET | Freq: Every day | ORAL | Status: DC
  Administered 2021-01-15 – 2021-01-16 (×2): 1000 ug via ORAL
  Filled 2021-01-15 (×2): qty 1

## 2021-01-15 MED ORDER — APIXABAN 5 MG PO TABS: 10.0000 mg | ORAL_TABLET | Freq: Two times a day (BID) | ORAL | 0 refills | Status: AC

## 2021-01-15 MED ORDER — DOXYCYCLINE HYCLATE 100 MG PO TABS: 100.0000 mg | ORAL_TABLET | Freq: Two times a day (BID) | ORAL | 0 refills | Status: AC

## 2021-01-15 MED ORDER — APIXABAN 5 MG PO TABS: 5.0000 mg | ORAL_TABLET | Freq: Two times a day (BID) | ORAL | 0 refills | Status: AC

## 2021-01-15 MED ORDER — FUROSEMIDE 20 MG PO TABS: 20.0000 mg | ORAL_TABLET | Freq: Every day | ORAL | 0 refills | Status: AC

## 2021-01-15 MED ORDER — FUROSEMIDE 20 MG PO TABS: 20.0000 mg | ORAL_TABLET | Freq: Every day | ORAL | 0 refills | Status: DC

## 2021-01-15 MED ORDER — CYANOCOBALAMIN 1000 MCG PO TABS: 1000.0000 ug | ORAL_TABLET | Freq: Every day | ORAL | 0 refills | Status: AC

## 2021-01-15 NOTE — [place] FL2 (Signed)
[place] MEDICAID FL2 LEVEL OF CARE SCREENING TOOL     IDENTIFICATION  Patient Name: [name] [name] Birthdate: 10/30/1929 Sex: female Admission Date (Current Location): 01/12/2021  [facility] and [place] Number:  Herbalist and Address:  [facility],  [place], [place]      Provider Number: [id number]  Attending Physician Name and Address:  Elodia Florence., *  Relative Name and Phone Number:  Glenford Bayley) Mikki Santee (Other)   [phone]    Current Level of Care: Hospital Recommended Level of Care: Assisted Living [facility] Care Prior Approval Number:    Date Approved/Denied:   PASRR Number: [id number]  Discharge Plan:  [facility])    Current Diagnoses: Patient Active Problem List   Diagnosis Date Noted  . Acute DVT (deep venous thrombosis) ([place]) 01/12/2021  . Cellulitis 01/12/2021  . Dementia ([place]) 01/12/2021  . DDD (degenerative disc disease), cervical 12/23/2018  . Chest pain 08/12/2017  . Dyspnea 08/12/2017  . Acute blood loss anemia 03/06/2013  . Anxiety state 03/06/2013  . Depression 03/06/2013  . Unspecified constipation 03/06/2013  . Fracture tibia/fibula 12/02/2012  . Hypokalemia 12/02/2012  . S/P AVR (aortic valve replacement) and aortoplasty 09/30/2012  . HTN (hypertension) 09/30/2012  . Hypertension   . Hypercholesterolemia   . Paroxysmal atrial fibrillation (HCC)   . Aortic aneurysm, thoracic ([place])   . Aortic insufficiency     Orientation RESPIRATION BLADDER Height & Weight     Self  Normal Continent (has "accidents" at times) Weight: 57.6 kg Height:  5\' 4"  (162.6 cm)  BEHAVIORAL SYMPTOMS/MOOD NEUROLOGICAL BOWEL NUTRITION STATUS   (none)  (none) Continent (has "accidents" at times) Diet (see d/c summary)  AMBULATORY STATUS COMMUNICATION OF NEEDS Skin   Supervision Verbally Normal                       Personal Care Assistance Level of Assistance   Bathing,Feeding,Dressing Bathing Assistance: Limited assistance Feeding assistance: Independent Dressing Assistance: Independent     Functional Limitations Info  Sight,Hearing,Speech Sight Info: Adequate Hearing Info: Adequate Speech Info: Adequate    SPECIAL CARE FACTORS FREQUENCY  PT (By licensed PT),OT (By licensed OT)     PT Frequency: 5X/W OT Frequency: 5X/W            Contractures Contractures Info: Not present    Additional Factors Info  Code Status,Allergies Code Status Info: Full Allergies Info: Codeine              Discharge Medications:  acetaminophen 500 MG tablet Commonly known as: TYLENOL Take 500 mg by mouth every 6 (six) hours as needed for mild pain, moderate pain, fever or headache.          Icon medications to start taking   * apixaban 5 MG Tabs tablet Commonly known as: ELIQUIS Take 2 tablets (10 mg total) by mouth 2 (two) times daily for 7 doses. (to complete on 4/9 PM - after this, take the 5 mg twice daily dosing starting on 4/10 am)         Icon medications to start taking   * apixaban 5 MG Tabs tablet Commonly known as: ELIQUIS Start taking on: January 19, 2021 Take 1 tablet (5 mg total) by mouth 2 (two) times daily.         Icon medications to start taking   cyanocobalamin 1000 MCG tablet Start taking on: January 16, 2021 Take 1 tablet (1,000 mcg total) by  mouth daily.          donepezil 10 MG tablet Commonly known as: ARICEPT Take 1 tablet (10 mg total) by mouth at bedtime.         Icon medications to start taking   doxycycline 100 MG tablet Commonly known as: VIBRA-TABS Take 1 tablet (100 mg total) by mouth every 12 (twelve) hours for 3 days.         Icon medications to change how you take   escitalopram 20 MG tablet Commonly known as: LEXAPRO Take 20 mg by mouth daily. What changed: Another medication with the same name was removed. Continue taking this medication, and follow the directions you see here.         Icon medications  to start taking   folic acid 1 MG tablet Commonly known as: FOLVITE Start taking on: January 16, 2021 Take 1 tablet (1 mg total) by mouth daily.         Icon medications to start taking   furosemide 20 MG tablet Commonly known as: LASIX Start taking on: January 16, 2021 Take 1 tablet (20 mg total) by mouth daily for 5 days.            Relevant Imaging Results:  Relevant Lab Results:   Additional Information [place], [place]

## 2021-01-15 NOTE — Progress Notes (Signed)
Physical Therapy Treatment Patient Details Name: [name] [name] MRN: [id number] DOB: 01/08/1930 Today's Date: 01/15/2021    History of Present Illness [name] [name] is a 87 y.o. female with medical history significant of dementia, anemia, thoracic aortic aneurysm, aortic insufficiency status post AVR and aortoplasty in 2013, history of colon cancer status post surgery in 1973, depression, anxiety, hypertension, hyperlipidemia, paroxysmal A. Fib presented to the ED via EMS for evaluation of bilateral lower extremity redness and swelling after one of her feral cats scratched her leg per neighbor report. Bilateral lower extremity Dopplers were done and revealed acute DVT involving one of the paired veins of the left posterior tibial veins and left peroneal veins. Pt also dx with LE cellulitis. Pt's neighbor is also requesting to keep her on suicide watch as she has threatened to kill herself multiple times by stating she will shoot herself or use a blade to cut herself.    PT Comments    Patient more limited this date due to c/o some abdominal discomfort however agreeable to mobilize short bout in hallway. Pt required min assist for transfers and gait with RW. Pt taking short steps and cues required for posture, pt required assist to maintain safe walker position and had tendency to leave Rt LE behind when turning. Visual cues improved pt's proximity to RW. Pt completed repeat sit<>stands and required bil UE use for power up and use of legs against chair to steady. HHA provided to facilitate slow eccentric lowering. Acute PT will continue to follow and progress pt as able.    Follow Up Recommendations  SNF (SNF with bridge to long term residential care.))     Equipment Recommendations  None recommended by PT    Recommendations for Other Services       Precautions / Restrictions Precautions Precautions: Fall Precaution Comments: Suicide precautions with 1:1 sitter Restrictions Weight  Bearing Restrictions: No    Mobility  Bed Mobility Overal bed mobility: Needs Assistance Bed Mobility: Supine to Sit     Supine to sit: Supervision;HOB elevated     General bed mobility comments: pt taking extra time and cues needed to initiate and scoot to EOB to get feet on floor    Transfers Overall transfer level: Needs assistance Equipment used: Rolling walker (2 wheeled) Transfers: Sit to/from Stand Sit to Stand: Min assist         General transfer comment: cues for safe hand placement on RW, bil UE required for power up from EOB.  Ambulation/Gait Ambulation/Gait assistance: Min assist;Min guard Gait Distance (Feet): 70 Feet Assistive device: Rolling walker (2 wheeled) Gait Pattern/deviations: Step-through pattern;Decreased stride length Gait velocity: decr   General Gait Details: Cues for safe walker position and min assist to manage, greater assist required during turns, pt's Rt LE remained behing while turning despite VC's to brign LE" together. visual cues improved pt's proximity to walker.   Stairs             Wheelchair Mobility    Modified Rankin (Stroke Patients Only)       Balance Overall balance assessment: Needs assistance;History of Falls Sitting-balance support: Feet supported Sitting balance-Leahy Scale: Good     Standing balance support: During functional activity;Bilateral upper extremity supported Standing balance-Leahy Scale: Poor                              Cognition Arousal/Alertness: Awake/alert Behavior During Therapy: WFL for tasks assessed/performed Overall Cognitive  Status: History of cognitive impairments - at baseline Area of Impairment: Memory;Safety/judgement                     Memory: Decreased short-term memory   Safety/Judgement: Decreased awareness of safety     General Comments: pt pleasant and oriented to self, not situation or place.      Exercises Other Exercises Other  Exercises: 1x5 reps Sit<>Stand: pt using bil UE for power up, cues for slow eccentric lowering with no use of UE's.    General Comments        Pertinent Vitals/Pain Pain Assessment: No/denies pain Faces Pain Scale: No hurt    Home Living                      Prior Function            PT Goals (current goals can now be found in the care plan section) Acute Rehab PT Goals PT Goal Formulation: Patient unable to participate in goal setting Time For Goal Achievement: 01/27/21 Potential to Achieve Goals: Good Progress towards PT goals: Progressing toward goals    Frequency    Min 2X/week      PT Plan Current plan remains appropriate    Co-evaluation              AM-PAC PT "6 Clicks" Mobility   Outcome Measure  Help needed turning from your back to your side while in a flat bed without using bedrails?: None Help needed moving from lying on your back to sitting on the side of a flat bed without using bedrails?: None Help needed moving to and from a bed to a chair (including a wheelchair)?: A Little Help needed standing up from a chair using your arms (e.g., wheelchair or bedside chair)?: A Little Help needed to walk in hospital room?: A Little Help needed climbing 3-5 steps with a railing? : A Lot 6 Click Score: 19    End of Session Equipment Utilized During Treatment: Gait belt Activity Tolerance: Patient tolerated treatment well Patient left: in chair;with call bell/phone within reach;with nursing/sitter in room Nurse Communication: Mobility status PT Visit Diagnosis: Difficulty in walking, not elsewhere classified (R26.2)     Time: 3212-2482 PT Time Calculation (min) (ACUTE ONLY): 19 min  Charges:  $Gait Training: 8-22 mins                     Verner Mould, DPT Acute Rehabilitation Services Office [phone] Pager [phone]     Jacques Navy 01/15/2021, 4:45 PM

## 2021-01-15 NOTE — [place] FL2 (Signed)
[place] MEDICAID FL2 LEVEL OF CARE SCREENING TOOL     IDENTIFICATION  Patient Name: [name] [name] Birthdate: September 27, 1930 Sex: female Admission Date (Current Location): 01/12/2021  [facility] and [place] Number:  Herbalist and Address:  [facility],  [place], [place]      Provider Number: [id number]  Attending Physician Name and Address:  Elodia Florence., *  Relative Name and Phone Number:  Glenford Bayley) Mikki Santee (Other)   [phone]    Current Level of Care: Hospital Recommended Level of Care: Assisted Living [facility] Care Prior Approval Number:    Date Approved/Denied:   PASRR Number: [id number]  Discharge Plan:  [facility])    Current Diagnoses: Patient Active Problem List   Diagnosis Date Noted  . Acute DVT (deep venous thrombosis) ([place]) 01/12/2021  . Cellulitis 01/12/2021  . Dementia ([place]) 01/12/2021  . DDD (degenerative disc disease), cervical 12/23/2018  . Chest pain 08/12/2017  . Dyspnea 08/12/2017  . Acute blood loss anemia 03/06/2013  . Anxiety state 03/06/2013  . Depression 03/06/2013  . Unspecified constipation 03/06/2013  . Fracture tibia/fibula 12/02/2012  . Hypokalemia 12/02/2012  . S/P AVR (aortic valve replacement) and aortoplasty 09/30/2012  . HTN (hypertension) 09/30/2012  . Hypertension   . Hypercholesterolemia   . Paroxysmal atrial fibrillation ([facility])   . Aortic aneurysm, thoracic ([place])   . Aortic insufficiency     Orientation RESPIRATION BLADDER Height & Weight     Self  Normal Continent (has "accidents" at times) Weight: 57.6 kg Height:  5\' 4"  (162.6 cm)  BEHAVIORAL SYMPTOMS/MOOD NEUROLOGICAL BOWEL NUTRITION STATUS   (none)  (none) Continent (has "accidents" at times) Diet (see d/c summary)  AMBULATORY STATUS COMMUNICATION OF NEEDS Skin   Supervision Verbally Normal                       Personal Care Assistance Level of Assistance   Bathing,Feeding,Dressing Bathing Assistance: Limited assistance Feeding assistance: Independent Dressing Assistance: Independent     Functional Limitations Info  Sight,Hearing,Speech Sight Info: Adequate Hearing Info: Adequate Speech Info: Adequate    SPECIAL CARE FACTORS FREQUENCY  PT (By licensed PT),OT (By licensed OT)     PT Frequency: 5X/W OT Frequency: 5X/W            Contractures Contractures Info: Not present    Additional Factors Info  Code Status,Allergies Code Status Info: Full Allergies Info: Codeine           Current Medications (01/15/2021):  This is the current hospital active medication list    Discharge Medications:  acetaminophen 500 MG tablet Commonly known as: TYLENOL Take 500 mg by mouth every 6 (six) hours as needed for mild pain, moderate pain, fever or headache.          Icon medications to start taking   * apixaban 5 MG Tabs tablet Commonly known as: ELIQUIS Take 2 tablets (10 mg total) by mouth 2 (two) times daily for 7 doses. (to complete on 4/9 PM - after this, take the 5 mg twice daily dosing starting on 4/10 am)         Icon medications to start taking   * apixaban 5 MG Tabs tablet Commonly known as: ELIQUIS Start taking on: January 19, 2021 Take 1 tablet (5 mg total) by mouth 2 (two) times daily.         Icon medications to start taking   cyanocobalamin 1000 MCG tablet Start  taking on: January 16, 2021 Take 1 tablet (1,000 mcg total) by mouth daily.         Icon medications to start taking   doxycycline 100 MG tablet Commonly known as: VIBRA-TABS Take 1 tablet (100 mg total) by mouth every 12 (twelve) hours for 3 days.         Icon medications to change how you take   escitalopram 20 MG tablet Commonly known as: LEXAPRO Take 20 mg by mouth daily. What changed: Another medication with the same name was removed. Continue taking this medication, and follow the directions you see here.         Icon medications to start taking   folic  acid 1 MG tablet Commonly known as: FOLVITE Start taking on: January 16, 2021 Take 1 tablet (1 mg total) by mouth daily.         Icon medications to start taking   furosemide 20 MG tablet Commonly known as: LASIX Start taking on: January 16, 2021 Take 1 tablet (20 mg total) by mouth daily for 5 days.            Relevant Imaging Results:  Relevant Lab Results:   Additional Information [place], [place]

## 2021-01-15 NOTE — Discharge Summary (Addendum)
Physician Discharge Summary  [name] [name] WEX:[id number] DOB: 01/12/1930 DOA: 01/12/2021  PCP: [name] Pretty, MD  Admit date: 01/12/2021 Discharge date: 01/15/2021  Time spent: 40 minutes  Recommendations for Outpatient Follow-up:  1. Follow outpatient CBC/CMP 2. Needs 3 months uninterrupted anticoagulation - would review risk factors with PCP outpatient to help determine long term anticoagulation needs - consider heme follow up  3. Follow cellulitis with completion of abx 4. Follow volume exam and LE swelling - discharged with lasix x5 days, can decide based on exam and labs whether this should be continued or given prn, etc 5. Follow with psych outpatient  6. Follow APS referral outpatient  7. Follow with cardiology outpatient 8. Follow pending methylmalonic acid 9. Follow folate deficiency outpatient - continue b12 supplementation pending MMA level 10. Follow with podiatry outpatient  11. Follow blood pressure meds outpatient - amlodipine, irbesartan being held on discharge    Discharge Diagnoses:  Principal Problem:   Acute DVT (deep venous thrombosis) (HCC) Active Problems:   Hypertension   Hypercholesterolemia   Cellulitis   Dementia ([name])   Discharge Condition: stable   Diet recommendation: heart healthy  Filed Weights   01/12/21 2124  Weight: 57.6 kg    History of present illness:  87 year old F with PMH of dementia, thoracic aortic aneurysm, aortic insufficiency s/p AVR and aortoplasty in 2013, paroxysmal [name]. Fib not on AC, remote colon cancer in 1973, HTN, HLD, anxiety and depression brought to ED by EMS after neighbors called EMS out of concern for bilateral lower extremity swelling and redness, poor living situation and suicidality.  In ED, vitals, basic labs and CXR without acute significant finding.  BNP 288.  LE US showed DVT in left posterior tibial and left peroneal veins.  Patient was started on p.o. Eliquis for DVT, doxycycline for possible cellulitis from  cat scratch and IV Lasix for leg swelling and possible acute CHF.  Clinically improved.  Psychiatry consulted and cleared patient.  Therapy recommended SNF.  Waiting on SNF bed.  Patient is medically ready for discharge on 4/6, see below for additional details.  Hospital Course: Acute left lower extremity DVT involving one of the paired veins of the left posterior tibial veins and left peroneal veins. -On [name] starter pack Eliquis.  Continue eliquis 10 mg twice daily until 4/9 PM and then transition to 5 mg twice daily on 4/10 am.   -Low suspicion for large PE - provoking factor unclear, needs at least 3 months anticoagulation - follow with PCP to discuss risk factors and long term duration of anticoagulation - consider heme follow up   Left lower extremity cellulitis in the setting of cat scratch: No systemic symptoms or leukocytosis to suggest sepsis.  Improving. -Continue p.o. doxycycline at discharge  Dementia with behavioral disturbance/poor living situation-only oriented to self today.   -Reorientation and delirium and fall precautions -Continue home Aricept  Elevated BNP  Concern for HFpEF: TTE with LVEF of 55 to 60%, mild LVH and stable repaired aortic valve and graft.  BNP elevated to 288.  Personally reviewed chest x-ray without signs pulmonary vascular congestion.  Edema improved.  Appears euvolemic.  5 unmeasured voids charted.  Creatinine is stable. -Change to p.o. Lasix 20 mg daily x 5 additional days, then reevaluate with PCP as outpatient continued need for diuresis  -Monitor fluid status and renal function  History of anxiety and depression: Stable.  She denies suicidal ideation.  Per H&P, concern came from patient's neighbor who called EMS.  Appreciate input by psychiatry (recommending lexapro, APS referral, did not meet inpatient criteria). -Safety sitter discontinued on 4/4. -Continue home Lexapro  Hypertension:Stable.   -Hold home amlodipine in the setting of edema   -Lasix as above  Paroxysmal [name]. Fib?  She is in sinus rhythm.  Not on [name] of medications at home. -Optimize K and Mg -Now on Eliquis primarily for DVT.  Hyponatremia: Hypervolemic?  Resolved.  Hypokalemia: Resolved.   Anemia  Folate Deficiency  Low Normal H08 -Folic acid and vitamin B12 supplementation -follow methylmalonic acid  Enlarged and hypertrophic toenails -Needs outpatient podiatry for trimming.  Debility/physical deconditioning: Patient lives alone with her 3 cats with no family close by. -Therapy recommended SNF.   Body mass index is 21.8 kg/m.  Procedures: Echo IMPRESSIONS    1. Left ventricular ejection fraction, by estimation, is 55 to 60%. The  left ventricle has normal function. The left ventricle has no regional  wall motion abnormalities. There is mild left ventricular hypertrophy.  Left ventricular diastolic parameters  were normal.  2. Right ventricular systolic function is normal. The right ventricular  size is normal. There is normal pulmonary artery systolic pressure.  3. The mitral valve is normal in structure. Mild mitral valve  regurgitation. No evidence of mitral stenosis.  4. 21 mm Medtronic Freestycle valved conduit with stable low gradients  and no PvL. The aortic valve has been repaired/replaced. Aortic valve  regurgitation is not visualized. No aortic stenosis is present. There is [name]  21 mm Medtronic freestyle graft. 24mm  Hemashield tube graft for the Ascending aorta repair. valve present in  the aortic position. Procedure Date: 03/20/2009.  5. The inferior vena cava is normal in size with greater than 50%  respiratory variability, suggesting right atrial pressure of 3 mmHg.   LE [place] Summary:  RIGHT:  - There is no evidence of deep vein thrombosis in the lower extremity.    LEFT:  - Findings consistent with acute deep vein thrombosis involving one of the  paired veins of left posterior tibial veins, and left peroneal  veins.   Consultations:  psychiatry  Discharge Exam: Vitals:   01/15/21 0619 01/15/21 1310  BP: (!) 152/80 (!) 122/58  Pulse: 79 83  Resp: 20 16  Temp: 98.9 F (37.2 C) 99.2 F (37.3 C)  SpO2: 96% 96%   Theresia Pree&Ox2 - knew April, didn't know location No new complaints Called Dolly, no answer  General: No acute distress.  Elderly woman in NAD, pleasant. Cardiovascular: RRR Lungs: unlabored Abdomen: Soft, nontender, nondistended Neurological: Alert and oriented 3. Moves all extremities 4. Cranial nerves II through XII grossly intact. Extremities: No clubbing or cyanosis. Trace edema.  LLE with healing abrasion with mild surrounding redness.  Curling thick long nails   Discharge Instructions   Discharge Instructions    Call MD for:  difficulty breathing, headache or visual disturbances   Complete by: As directed    Call MD for:  extreme fatigue   Complete by: As directed    Call MD for:  hives   Complete by: As directed    Call MD for:  persistant dizziness or light-headedness   Complete by: As directed    Call MD for:  persistant nausea and vomiting   Complete by: As directed    Call MD for:  redness, tenderness, or signs of infection (pain, swelling, redness, odor or green/yellow discharge around incision site)   Complete by: As directed    Call MD for:  severe uncontrolled  pain   Complete by: As directed    Call MD for:  temperature >100.4   Complete by: As directed    Diet - low sodium heart healthy   Complete by: As directed    Discharge instructions   Complete by: As directed    You were seen for Katelynd Blauvelt DVT of your left leg (blood clot).  We're treating you with eliquis (Latoria Dry blood thinner).  You'll need to continue eliquis for at least 3 months.  Please follow up with your PCP for further evaluation and discussion of risk factors.  They'll help determine how long you'll ultimately need to be on blood thinners.  We have you on doxycycline for Shalisa Mcquade skin infection after Clarion Mooneyhan cat  scratch.  Follow up with your PCP after completion.  We started you on lasix (water pill) for heart failure or volume overload.  Please follow up with your PCP for repeat labs to determine if this can be continued.  You were seen by psychiatry for suicidal ideation.  They did not recommend inpatient psych treatment, but did recommend continuing the escitalopram.  We started you on B12 and folate supplementation.  Follow with your PCP outpatient.  You need to see podiatry as an outpatient for your toenails.  Return for new, recurrent, or worsening symptoms.  Please ask your PCP to request records from this hospitalization so they know what was done and what the next steps will be.   Increase activity slowly   Complete by: As directed      Allergies as of 01/15/2021      Reactions   Codeine Nausea And Vomiting      Medication List    STOP taking these medications   amLODipine 5 MG tablet Commonly known as: NORVASC   irbesartan 300 MG tablet Commonly known as: AVAPRO   traMADol 50 MG tablet Commonly known as: ULTRAM     TAKE these medications   acetaminophen 500 MG tablet Commonly known as: TYLENOL Take 500 mg by mouth every 6 (six) hours as needed for mild pain, moderate pain, fever or headache.   apixaban 5 MG Tabs tablet Commonly known as: ELIQUIS Take 2 tablets (10 mg total) by mouth 2 (two) times daily for 7 doses. (to complete on 4/9 PM - after this, take the 5 mg twice daily dosing starting on 4/10 am)   apixaban 5 MG Tabs tablet Commonly known as: ELIQUIS Take 1 tablet (5 mg total) by mouth 2 (two) times daily. Start taking on: January 19, 2021   cyanocobalamin 1000 MCG tablet Take 1 tablet (1,000 mcg total) by mouth daily. Start taking on: January 16, 2021   donepezil 10 MG tablet Commonly known as: ARICEPT Take 1 tablet (10 mg total) by mouth at bedtime.   doxycycline 100 MG tablet Commonly known as: VIBRA-TABS Take 1 tablet (100 mg total) by mouth every 12  (twelve) hours for 3 days.   escitalopram 20 MG tablet Commonly known as: LEXAPRO Take 20 mg by mouth daily. What changed: Another medication with the same name was removed. Continue taking this medication, and follow the directions you see here.   folic acid 1 MG tablet Commonly known as: FOLVITE Take 1 tablet (1 mg total) by mouth daily. Start taking on: January 16, 2021   furosemide 20 MG tablet Commonly known as: LASIX Take 1 tablet (20 mg total) by mouth daily for 5 days. Start taking on: January 16, 2021      Allergies  Allergen  Reactions  . Codeine Nausea And Vomiting      The results of significant diagnostics from this hospitalization (including imaging, microbiology, ancillary and laboratory) are listed below for reference.    Significant Diagnostic Studies: DG Chest Portable 1 View  Result Date: 01/12/2021 CLINICAL DATA:  Bilateral lower extremity redness and swelling. EXAM: PORTABLE CHEST 1 VIEW COMPARISON:  August 12, 2017 FINDINGS: The lungs are hyperinflated. Multiple sternal wires are seen. Chronic appearing increased lung markings are noted. Mild linear scarring and/or atelectasis is seen within the left lung base. There is no evidence of Warnie Belair pleural effusion or pneumothorax. The heart size and mediastinal contours are within normal limits. There is moderate severity calcification of the thoracic aorta. The visualized skeletal structures are unremarkable. IMPRESSION: 1. Evidence of prior median sternotomy/CABG. 2. Mild left basilar linear scarring and/or atelectasis. Electronically Signed   By: Virgina Norfolk M.D.   On: 01/12/2021 18:12   ECHOCARDIOGRAM COMPLETE  Result Date: 01/13/2021    ECHOCARDIOGRAM REPORT   Patient Name:   [name] [name] Date of Exam: 01/13/2021 Medical Rec #:  [id number]        Height:       64.0 in Accession #:    [id number]       Weight:       127.0 lb Date of Birth:  01-27-1930       BSA:          1.613 m Patient Age:    62 years         BP:            146/87 mmHg Patient Gender: F                HR:           90 bpm. Exam Location:  Inpatient Procedure: 2D Echo, Cardiac Doppler and Color Doppler Indications:    Edema [782.3.ICD-9-CM]  History:        Patient has prior history of Echocardiogram examinations, most                 recent 08/19/2017. Arrythmias:Atrial Fibrillation; Risk                 Factors:Hypertension and Dyslipidemia. Anemia. History of                 cancer.                 Aortic Valve: 21 mm Medtronic freestyle graft. 78mm Hemashield                 tube graft for the Ascending aorta repair. valve is present in                 the aortic position. Procedure Date: 03/20/2009.  Sonographer:    Darlina Sicilian RDCS Referring Phys: [id number] Charlesetta Ivory GONFA IMPRESSIONS  1. Left ventricular ejection fraction, by estimation, is 55 to 60%. The left ventricle has normal function. The left ventricle has no regional wall motion abnormalities. There is mild left ventricular hypertrophy. Left ventricular diastolic parameters were normal.  2. Right ventricular systolic function is normal. The right ventricular size is normal. There is normal pulmonary artery systolic pressure.  3. The mitral valve is normal in structure. Mild mitral valve regurgitation. No evidence of mitral stenosis.  4. 21 mm Medtronic Freestycle valved conduit with stable low gradients and no PvL. The aortic valve has been repaired/replaced. Aortic valve regurgitation is not visualized. No aortic  stenosis is present. There is Muslima Toppins 21 mm Medtronic freestyle graft. 46mm  Hemashield tube graft for the Ascending aorta repair. valve present in the aortic position. Procedure Date: 03/20/2009.  5. The inferior vena cava is normal in size with greater than 50% respiratory variability, suggesting right atrial pressure of 3 mmHg. FINDINGS  Left Ventricle: Left ventricular ejection fraction, by estimation, is 55 to 60%. The left ventricle has normal function. The left ventricle has no regional  wall motion abnormalities. The left ventricular internal cavity size was normal in size. There is  mild left ventricular hypertrophy. Left ventricular diastolic parameters were normal. Right Ventricle: The right ventricular size is normal. No increase in right ventricular wall thickness. Right ventricular systolic function is normal. There is normal pulmonary artery systolic pressure. The tricuspid regurgitant velocity is 2.43 m/s, and  with an assumed right atrial pressure of 3 mmHg, the estimated right ventricular systolic pressure is 44.0 mmHg. Left Atrium: Left atrial size was normal in size. Right Atrium: Right atrial size was normal in size. Pericardium: There is no evidence of pericardial effusion. Mitral Valve: The mitral valve is normal in structure. There is mild thickening of the mitral valve leaflet(s). Mild mitral valve regurgitation. No evidence of mitral valve stenosis. Tricuspid Valve: The tricuspid valve is normal in structure. Tricuspid valve regurgitation is mild . No evidence of tricuspid stenosis. Aortic Valve: 21 mm Medtronic Freestycle valved conduit with stable low gradients and no PvL. The aortic valve has been repaired/replaced. Aortic valve regurgitation is not visualized. No aortic stenosis is present. Aortic valve mean gradient measures 6.3 mmHg. Aortic valve peak gradient measures 13.0 mmHg. There is Keiland Pickering 21 mm Medtronic freestyle graft. 69mm Hemashield tube graft for the Ascending aorta repair. valve present in the aortic position. Procedure Date: 03/20/2009. Pulmonic Valve: The pulmonic valve was normal in structure. Pulmonic valve regurgitation is mild. No evidence of pulmonic stenosis. Aorta: The aortic root is normal in size and structure. Venous: The inferior vena cava is normal in size with greater than 50% respiratory variability, suggesting right atrial pressure of 3 mmHg. IAS/Shunts: No atrial level shunt detected by color flow Doppler.  LEFT VENTRICLE PLAX 2D LVIDd:         4.10  cm Diastology LVIDs:         3.00 cm LV e' medial:    4.46 cm/s LV PW:         1.00 cm LV E/e' medial:  21.0 LV IVS:        1.20 cm LV e' lateral:   6.42 cm/s                        LV E/e' lateral: 14.6  RIGHT VENTRICLE RV S prime:     14.80 cm/s TAPSE (M-mode): 1.7 cm LEFT ATRIUM             Index       RIGHT ATRIUM           Index LA diam:        2.70 cm 1.67 cm/m  RA Area:     15.10 cm LA Vol (A2C):   44.3 ml 27.47 ml/m RA Volume:   34.50 ml  21.39 ml/m LA Vol (A4C):   32.4 ml 20.09 ml/m LA Biplane Vol: 41.8 ml 25.92 ml/m  AORTIC VALVE AV Vmax:           180.33 cm/s AV Vmean:  117.333 cm/s AV VTI:            0.350 m AV Peak Grad:      13.0 mmHg AV Mean Grad:      6.3 mmHg LVOT Vmax:         110.00 cm/s LVOT Vmean:        71.500 cm/s LVOT VTI:          0.217 m LVOT/AV VTI ratio: 0.62  AORTA Ao Root diam: 3.00 cm Ao Asc diam:  2.90 cm MITRAL VALVE                TRICUSPID VALVE MV Area (PHT): 7.51 cm     TR Peak grad:   23.6 mmHg MV Decel Time: 101 msec     TR Vmax:        243.00 cm/s MV E velocity: 93.85 cm/s MV Brodi Kari velocity: 109.00 cm/s  SHUNTS MV E/Zondra Lawlor ratio:  0.86         Systemic VTI: 0.22 m Jenkins Rouge MD Electronically signed by Jenkins Rouge MD Signature Date/Time: 01/13/2021/11:50:06 AM    Final    VAS [place] LOWER EXTREMITY VENOUS (DVT) (MC and WL 7a-7p)  Result Date: 01/13/2021  Lower Venous DVT Study Indications: Pain, Swelling, and Erythema.  Comparison Study: No prior study on file Performing Technologist: Sharion Dove RVS  Examination Guidelines: Chloey Ricard complete evaluation includes B-mode imaging, spectral Doppler, color Doppler, and power Doppler as needed of all accessible portions of each vessel. Bilateral testing is considered an integral part of Artemis Koller complete examination. Limited examinations for reoccurring indications may be performed as noted. The reflux portion of the exam is performed with the patient in reverse Trendelenburg.   +---------+---------------+---------+-----------+----------+--------------+ RIGHT    CompressibilityPhasicitySpontaneityPropertiesThrombus Aging +---------+---------------+---------+-----------+----------+--------------+ CFV      Full           Yes      Yes                                 +---------+---------------+---------+-----------+----------+--------------+ SFJ      Full                                                        +---------+---------------+---------+-----------+----------+--------------+ FV Prox  Full                                                        +---------+---------------+---------+-----------+----------+--------------+ FV Mid   Full                                                        +---------+---------------+---------+-----------+----------+--------------+ FV DistalFull                                                        +---------+---------------+---------+-----------+----------+--------------+  PFV      Full                                                        +---------+---------------+---------+-----------+----------+--------------+ POP      Full           Yes      Yes                                 +---------+---------------+---------+-----------+----------+--------------+ PTV      Full                                                        +---------+---------------+---------+-----------+----------+--------------+ PERO     Full                                                        +---------+---------------+---------+-----------+----------+--------------+   +---------+---------------+---------+-----------+----------+--------------+ LEFT     CompressibilityPhasicitySpontaneityPropertiesThrombus Aging +---------+---------------+---------+-----------+----------+--------------+ CFV      Full           Yes      Yes                                  +---------+---------------+---------+-----------+----------+--------------+ SFJ      Full                                                        +---------+---------------+---------+-----------+----------+--------------+ FV Prox  Full                                                        +---------+---------------+---------+-----------+----------+--------------+ FV Mid   Full                                                        +---------+---------------+---------+-----------+----------+--------------+ FV DistalFull                                                        +---------+---------------+---------+-----------+----------+--------------+ PFV      Full                                                        +---------+---------------+---------+-----------+----------+--------------+   POP      Full           Yes      Yes                                 +---------+---------------+---------+-----------+----------+--------------+ PTV      None                                         Acute          +---------+---------------+---------+-----------+----------+--------------+ PERO     None                                         Acute          +---------+---------------+---------+-----------+----------+--------------+     Summary: RIGHT: - There is no evidence of deep vein thrombosis in the lower extremity.  LEFT: - Findings consistent with acute deep vein thrombosis involving one of the paired veins of left posterior tibial veins, and left peroneal veins.  *See table(s) above for measurements and observations. Electronically signed by Ruta Hinds MD on 01/13/2021 at 3:03:36 PM.    Final     Microbiology: Recent Results (from the past 240 hour(s))  SARS CORONAVIRUS 2 (TAT 6-24 HRS) Nasopharyngeal Nasopharyngeal Swab     Status: None   Collection Time: 01/12/21  8:31 PM   Specimen: Nasopharyngeal Swab  Result Value Ref Range Status   SARS Coronavirus 2  NEGATIVE NEGATIVE Final    Comment: (NOTE) SARS-CoV-2 target nucleic acids are NOT DETECTED.  The SARS-CoV-2 RNA is generally detectable in upper and lower respiratory specimens during the acute phase of infection. Negative results do not preclude SARS-CoV-2 infection, do not rule out co-infections with other pathogens, and should not be used as the sole basis for treatment or other patient management decisions. Negative results must be combined with clinical observations, patient history, and epidemiological information. The expected result is Negative.  Fact Sheet for Patients: SugarRoll.be  Fact Sheet for Healthcare Providers: https://www.woods-mathews.com/  This test is not yet approved or cleared by the [place] FDA and  has been authorized for detection and/or diagnosis of SARS-CoV-2 by FDA under an Emergency Use Authorization (EUA). This EUA will remain  in effect (meaning this test can be used) for the duration of the COVID-19 declaration under Se ction 564(b)(1) of the Act, 21 U.S.C. section 360bbb-3(b)(1), unless the authorization is terminated or revoked sooner.  Performed at [facility], [place] [address], [place], [place] [address]   SARS CORONAVIRUS 2 (TAT 6-24 HRS) Nasopharyngeal Nasopharyngeal Swab     Status: None   Collection Time: 01/15/21  7:38 AM   Specimen: Nasopharyngeal Swab  Result Value Ref Range Status   SARS Coronavirus 2 NEGATIVE NEGATIVE Final    Comment: (NOTE) SARS-CoV-2 target nucleic acids are NOT DETECTED.  The SARS-CoV-2 RNA is generally detectable in upper and lower respiratory specimens during the acute phase of infection. Negative results do not preclude SARS-CoV-2 infection, do not rule out co-infections with other pathogens, and should not be used as the sole basis for treatment or other patient management decisions. Negative results must be combined with clinical  observations, patient history, and epidemiological information. The expected result is Negative.  Fact Sheet for Patients: SugarRoll.be  Fact Sheet for Healthcare Providers: https://www.woods-mathews.com/  This test is not yet approved or cleared by the [place] FDA and  has been authorized for detection and/or diagnosis of SARS-CoV-2 by FDA under an Emergency Use Authorization (EUA). This EUA will remain  in effect (meaning this test can be used) for the duration of the COVID-19 declaration under Se ction 564(b)(1) of the Act, 21 U.S.C. section 360bbb-3(b)(1), unless the authorization is terminated or revoked sooner.  Performed at [facility], [place] [address] [place], [place] [address]      Labs: Basic Metabolic Panel: Recent Labs  Lab 01/12/21 1727 01/13/21 0305 01/14/21 0401  NA 134* 137 135  K 4.0 3.2* 4.1  CL 101 101 102  CO2 25 28 26   GLUCOSE 87 92 85  BUN 21 18 15   CREATININE 0.70 0.72 0.76  CALCIUM 8.0* 7.8* 8.2*  MG  --  1.8 2.0  PHOS  --   --  3.0   Liver Function Tests: Recent Labs  Lab 01/13/21 0305 01/14/21 0401  AST 24  --   ALT 22  --   ALKPHOS 71  --   BILITOT 0.6  --   PROT 5.5*  --   ALBUMIN 2.5* 2.5*   No results for input(s): LIPASE, AMYLASE in the last 168 hours. No results for input(s): AMMONIA in the last 168 hours. CBC: Recent Labs  Lab 01/12/21 1727 01/13/21 0305 01/14/21 0401  WBC 9.1 8.7 9.0  NEUTROABS 6.9  --   --   HGB 11.2* 10.2* 10.7*  HCT 34.4* 31.3* 32.8*  MCV 88.2 87.2 88.2  PLT 208 216 231   Cardiac Enzymes: No results for input(s): CKTOTAL, CKMB, CKMBINDEX, TROPONINI in the last 168 hours. BNP: BNP (last 3 results) Recent Labs    01/12/21 1727  BNP 288.2*    ProBNP (last 3 results) No results for input(s): PROBNP in the last 8760 hours.  CBG: No results for input(s): GLUCAP in the last 168 hours.     Signed:  Fayrene Helper MD.  [facility] 01/15/2021, 2:42 PM

## 2021-01-16 DIAGNOSIS — I82452 Acute embolism and thrombosis of left peroneal vein: Secondary | ICD-10-CM | POA: Diagnosis not present

## 2021-01-16 NOTE — TOC Transition Note (Signed)
Transition of Care [facility]) - CM/SW Discharge Note   Patient Details  Name: [name] [name] MRN: [id number] Date of Birth: 06/30/1930  Transition of Care [facility]) CM/SW Contact:  Trish Mage, LCSW Phone Number: 01/16/2021, 10:16 AM   Clinical Narrative:   Patient who is stable for d/c will transition to [facility].  Family alerted.  PTAR arranged.  Nursing, please call report to [phone].  TOC sign off.    Final next level of care: Assisted Living Barriers to Discharge: Barriers Resolved   Patient Goals and CMS Choice     Choice offered to / list presented to : Patient  Discharge Placement                       Discharge Plan and Services   Discharge Planning Services: CM Consult Post Acute Care Choice: [place]                               Social Determinants of Health (SDOH) Interventions     Readmission Risk Interventions No flowsheet data found.

## 2021-01-16 NOTE — Progress Notes (Signed)
Seen and examined.   Stable for discharge.  Discharged yesterday, remains stable for discharge. No new complaints today, Eon Zunker&Ox2 (stable from 4/6).    NAD RRR, unlabored S/NT/ND No LEE Healing abrasion to LLE with scab, mild surrounding redness.  87 yo being treated for DVT and cellulitis as well as possible volume overload.   See d/c note from yesterday for additional details regarding Chord Takahashi&P. Remains stable for discharge

## 2021-01-21 DIAGNOSIS — I10 Essential (primary) hypertension: Secondary | ICD-10-CM | POA: Diagnosis not present

## 2021-01-21 DIAGNOSIS — F0390 Unspecified dementia without behavioral disturbance: Secondary | ICD-10-CM | POA: Diagnosis not present

## 2021-01-21 DIAGNOSIS — F32A Depression, unspecified: Secondary | ICD-10-CM | POA: Diagnosis not present

## 2021-01-21 DIAGNOSIS — F419 Anxiety disorder, unspecified: Secondary | ICD-10-CM | POA: Diagnosis not present

## 2021-01-21 LAB — METHYLMALONIC ACID, SERUM: Methylmalonic Acid, Quantitative: 749 nmol/L — ABNORMAL HIGH (ref 0–378)

## 2021-01-23 DIAGNOSIS — F4323 Adjustment disorder with mixed anxiety and depressed mood: Secondary | ICD-10-CM | POA: Diagnosis not present

## 2021-01-25 DIAGNOSIS — F0390 Unspecified dementia without behavioral disturbance: Secondary | ICD-10-CM | POA: Diagnosis not present

## 2021-02-05 DIAGNOSIS — G609 Hereditary and idiopathic neuropathy, unspecified: Secondary | ICD-10-CM | POA: Diagnosis not present

## 2021-02-05 DIAGNOSIS — F321 Major depressive disorder, single episode, moderate: Secondary | ICD-10-CM | POA: Diagnosis not present

## 2021-02-17 DIAGNOSIS — F0390 Unspecified dementia without behavioral disturbance: Secondary | ICD-10-CM | POA: Diagnosis not present

## 2021-02-17 DIAGNOSIS — M4322 Fusion of spine, cervical region: Secondary | ICD-10-CM | POA: Diagnosis not present

## 2021-02-17 DIAGNOSIS — M47812 Spondylosis without myelopathy or radiculopathy, cervical region: Secondary | ICD-10-CM | POA: Diagnosis not present

## 2021-02-17 DIAGNOSIS — W01198A Fall on same level from slipping, tripping and stumbling with subsequent striking against other object, initial encounter: Secondary | ICD-10-CM | POA: Diagnosis not present

## 2021-02-17 DIAGNOSIS — M2578 Osteophyte, vertebrae: Secondary | ICD-10-CM | POA: Diagnosis not present

## 2021-02-17 DIAGNOSIS — R609 Edema, unspecified: Secondary | ICD-10-CM | POA: Diagnosis not present

## 2021-02-17 DIAGNOSIS — R404 Transient alteration of awareness: Secondary | ICD-10-CM | POA: Diagnosis not present

## 2021-02-17 DIAGNOSIS — Y998 Other external cause status: Secondary | ICD-10-CM | POA: Diagnosis not present

## 2021-02-17 DIAGNOSIS — M4802 Spinal stenosis, cervical region: Secondary | ICD-10-CM | POA: Diagnosis not present

## 2021-02-17 DIAGNOSIS — Z7901 Long term (current) use of anticoagulants: Secondary | ICD-10-CM | POA: Diagnosis not present

## 2021-02-17 DIAGNOSIS — W19XXXA Unspecified fall, initial encounter: Secondary | ICD-10-CM | POA: Diagnosis not present

## 2021-02-17 DIAGNOSIS — S40811A Abrasion of right upper arm, initial encounter: Secondary | ICD-10-CM | POA: Diagnosis not present

## 2021-02-17 DIAGNOSIS — S0990XA Unspecified injury of head, initial encounter: Secondary | ICD-10-CM | POA: Diagnosis not present

## 2021-02-17 DIAGNOSIS — R410 Disorientation, unspecified: Secondary | ICD-10-CM | POA: Diagnosis not present

## 2021-02-17 DIAGNOSIS — Z043 Encounter for examination and observation following other accident: Secondary | ICD-10-CM | POA: Diagnosis not present

## 2021-02-18 DIAGNOSIS — R296 Repeated falls: Secondary | ICD-10-CM | POA: Diagnosis not present

## 2021-02-21 DIAGNOSIS — S0003XA Contusion of scalp, initial encounter: Secondary | ICD-10-CM | POA: Diagnosis not present

## 2021-02-21 DIAGNOSIS — W19XXXA Unspecified fall, initial encounter: Secondary | ICD-10-CM | POA: Diagnosis not present

## 2021-02-21 DIAGNOSIS — Z7401 Bed confinement status: Secondary | ICD-10-CM | POA: Diagnosis not present

## 2021-02-21 DIAGNOSIS — R0902 Hypoxemia: Secondary | ICD-10-CM | POA: Diagnosis not present

## 2021-02-21 DIAGNOSIS — I10 Essential (primary) hypertension: Secondary | ICD-10-CM | POA: Diagnosis not present

## 2021-02-21 DIAGNOSIS — Z23 Encounter for immunization: Secondary | ICD-10-CM | POA: Diagnosis not present

## 2021-02-21 DIAGNOSIS — S022XXA Fracture of nasal bones, initial encounter for closed fracture: Secondary | ICD-10-CM | POA: Diagnosis not present

## 2021-02-21 DIAGNOSIS — M2550 Pain in unspecified joint: Secondary | ICD-10-CM | POA: Diagnosis not present

## 2021-02-21 DIAGNOSIS — R404 Transient alteration of awareness: Secondary | ICD-10-CM | POA: Diagnosis not present

## 2021-02-21 DIAGNOSIS — R4182 Altered mental status, unspecified: Secondary | ICD-10-CM | POA: Diagnosis not present

## 2021-02-21 DIAGNOSIS — R040 Epistaxis: Secondary | ICD-10-CM | POA: Diagnosis not present

## 2021-02-25 DIAGNOSIS — R296 Repeated falls: Secondary | ICD-10-CM | POA: Diagnosis not present

## 2021-02-25 DIAGNOSIS — F0390 Unspecified dementia without behavioral disturbance: Secondary | ICD-10-CM | POA: Diagnosis not present

## 2021-03-04 DIAGNOSIS — S022XXA Fracture of nasal bones, initial encounter for closed fracture: Secondary | ICD-10-CM | POA: Diagnosis not present

## 2021-03-04 DIAGNOSIS — F0390 Unspecified dementia without behavioral disturbance: Secondary | ICD-10-CM | POA: Diagnosis not present

## 2021-03-04 DIAGNOSIS — Z7901 Long term (current) use of anticoagulants: Secondary | ICD-10-CM | POA: Diagnosis not present

## 2021-03-04 DIAGNOSIS — Z593 Problems related to living in residential institution: Secondary | ICD-10-CM | POA: Diagnosis not present

## 2021-03-07 DIAGNOSIS — F321 Major depressive disorder, single episode, moderate: Secondary | ICD-10-CM | POA: Diagnosis not present

## 2021-03-07 DIAGNOSIS — G609 Hereditary and idiopathic neuropathy, unspecified: Secondary | ICD-10-CM | POA: Diagnosis not present

## 2021-03-18 DIAGNOSIS — R011 Cardiac murmur, unspecified: Secondary | ICD-10-CM | POA: Diagnosis not present

## 2021-03-27 DIAGNOSIS — I10 Essential (primary) hypertension: Secondary | ICD-10-CM | POA: Diagnosis not present

## 2021-03-27 DIAGNOSIS — F32A Depression, unspecified: Secondary | ICD-10-CM | POA: Diagnosis not present

## 2021-03-27 DIAGNOSIS — F419 Anxiety disorder, unspecified: Secondary | ICD-10-CM | POA: Diagnosis not present

## 2021-03-27 DIAGNOSIS — F0390 Unspecified dementia without behavioral disturbance: Secondary | ICD-10-CM | POA: Diagnosis not present

## 2021-04-02 DIAGNOSIS — G609 Hereditary and idiopathic neuropathy, unspecified: Secondary | ICD-10-CM | POA: Diagnosis not present

## 2021-04-02 DIAGNOSIS — F321 Major depressive disorder, single episode, moderate: Secondary | ICD-10-CM | POA: Diagnosis not present

## 2021-04-12 DIAGNOSIS — I10 Essential (primary) hypertension: Secondary | ICD-10-CM | POA: Diagnosis not present

## 2021-04-19 IMAGING — MR MR HEAD W/O CM
9 series · 48 of 48 positions shown · non-contrast
Comparison: None.

CLINICAL DATA: Memory loss, lightheadedness, headaches, blurred
vision, and fatigue. Worsening symptoms for a few months. History of
colon cancer.

EXAM:
MRI HEAD WITHOUT CONTRAST
TECHNIQUE: Multiplanar, multiecho pulse sequences of the brain and surrounding
structures were obtained without intravenous contrast.

[Series 2: t1_se_sag · sagittal · 5.0mm · 0.45mm/px · 1 of 19 slices shown]
[im 1/19]
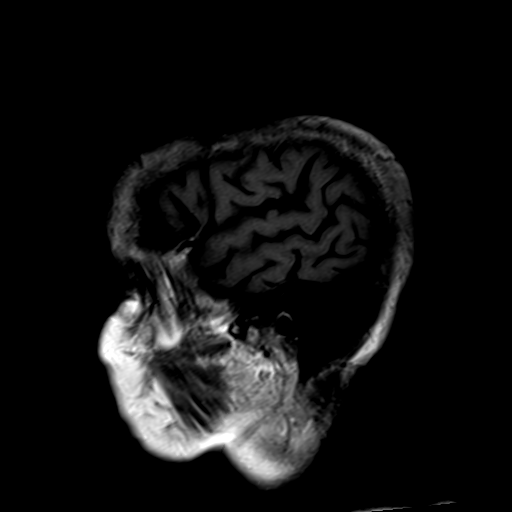

[Series 3: ep2d_diff_ · axial · 3.0mm · 1.80mm/px · z∈[-49,+86]mm · 9 of 92 slices shown]
[im 1/92]
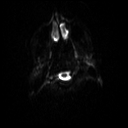
[im 12/92]
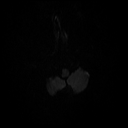
[im 23/92]
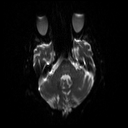
[im 35/92]
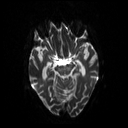
[im 46/92]
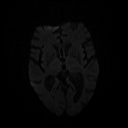
[im 57/92]
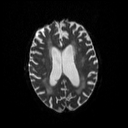
[im 69/92]
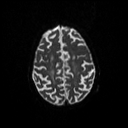
[im 80/92]
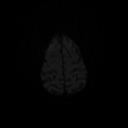
[im 92/92]
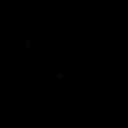

[Series 4: ep2d_diff__adc · axial · 3.0mm · 1.80mm/px · z∈[-49,+86]mm · 5 of 45 slices shown]
[im 1/45]
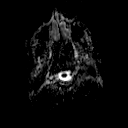
[im 12/45]
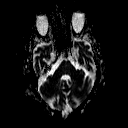
[im 23/45]
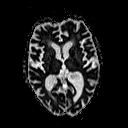
[im 34/45]
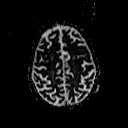
[im 45/45]
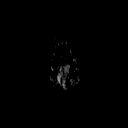

[Series 5: ep2d_diff_cor · coronal · 5.0mm · 1.77mm/px · 5 of 50 slices shown]
[im 1/50]
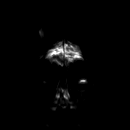
[im 13/50]
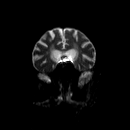
[im 25/50]
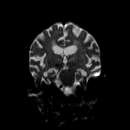
[im 37/50]
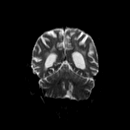
[im 50/50]
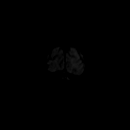

[Series 6: ep2d_diff_cor_adc · coronal · 5.0mm · 1.77mm/px · 3 of 25 slices shown]
[im 1/25]
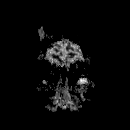
[im 13/25]
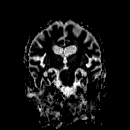
[im 25/25]
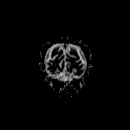

[Series 8: swi_images · axial · 4.0mm · 0.90mm/px · z∈[-51,+89]mm · 4 of 36 slices shown]
[im 1/36]
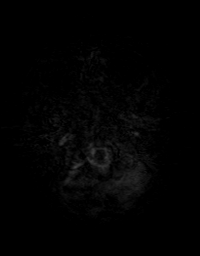
[im 12/36]
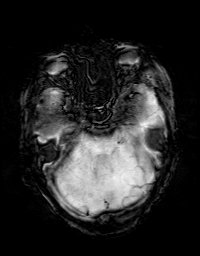
[im 24/36]
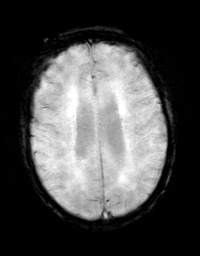
[im 36/36]
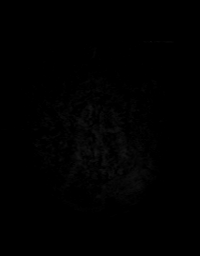

[Series 9: FLAIR · axial · 3.0mm · 0.43mm/px · z∈[-60,+96]mm · 3 of 27 slices shown]
[im 1/27]
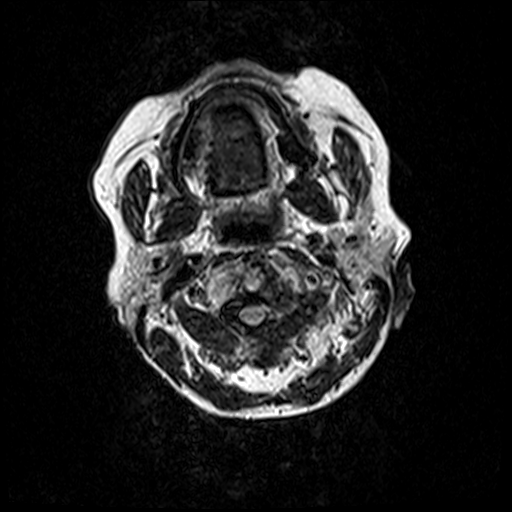
[im 14/27]
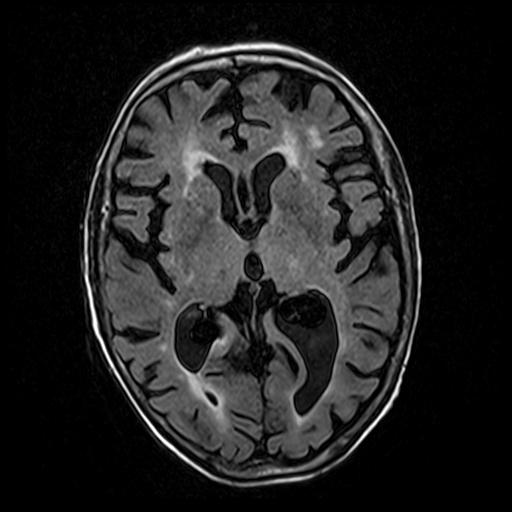
[im 27/27]
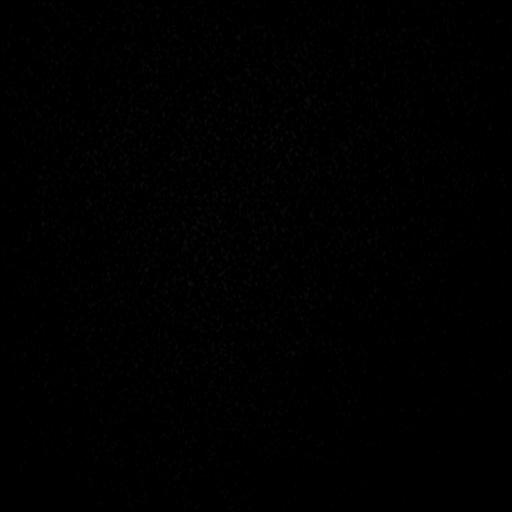

[Series 10: t1_mpr_tra · axial · 1.0mm · 0.72mm/px · z∈[-53,+90]mm · 15 of 144 slices shown]
[im 1/144]
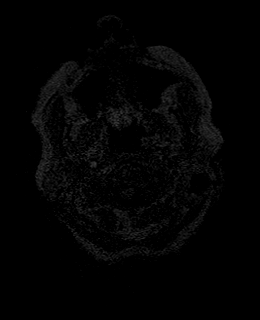
[im 11/144]
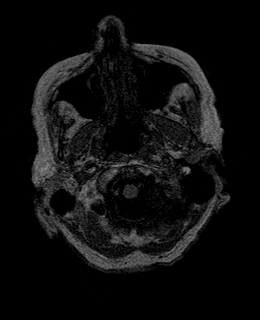
[im 21/144]
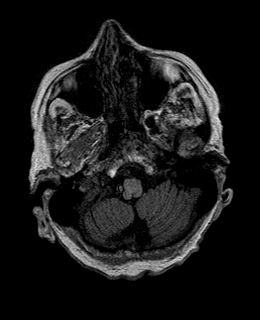
[im 31/144]
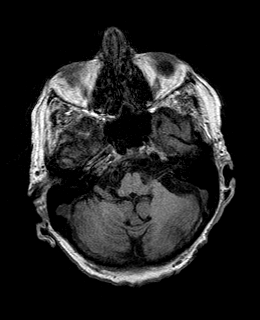
[im 41/144]
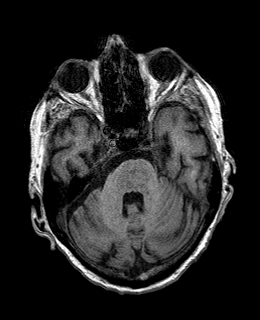
[im 52/144]
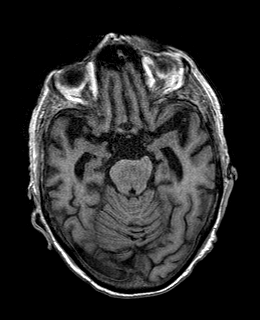
[im 62/144]
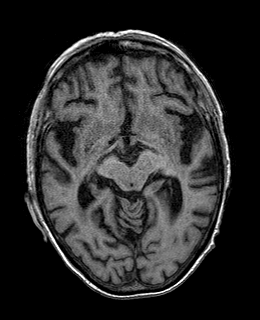
[im 72/144]
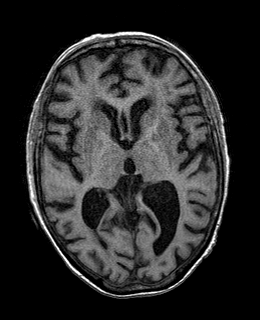
[im 82/144]
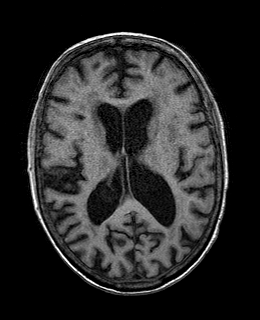
[im 92/144]
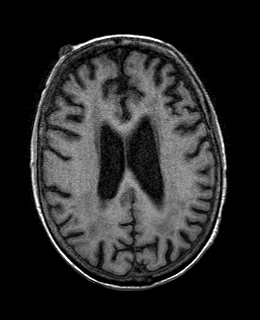
[im 103/144]
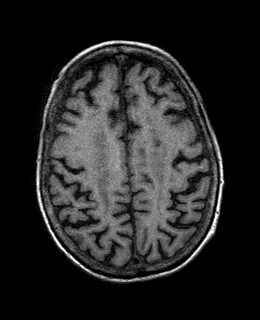
[im 113/144]
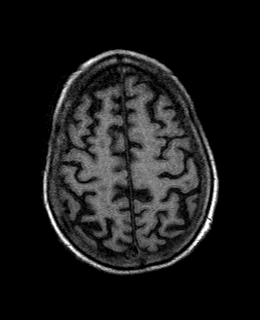
[im 123/144]
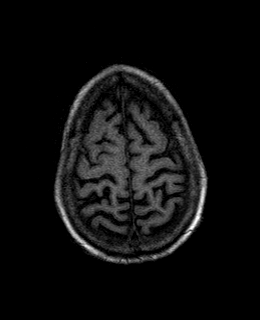
[im 133/144]
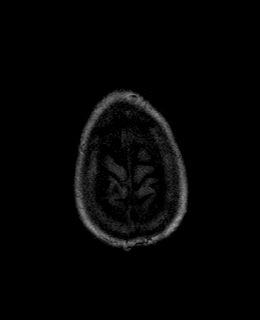
[im 144/144]
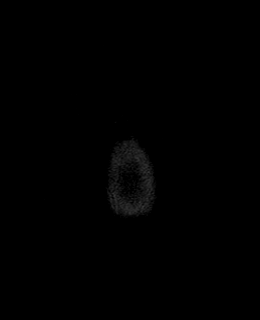

[Series 11: T2 · coronal · 5.0mm · 0.45mm/px · 3 of 26 slices shown]
[im 1/26]
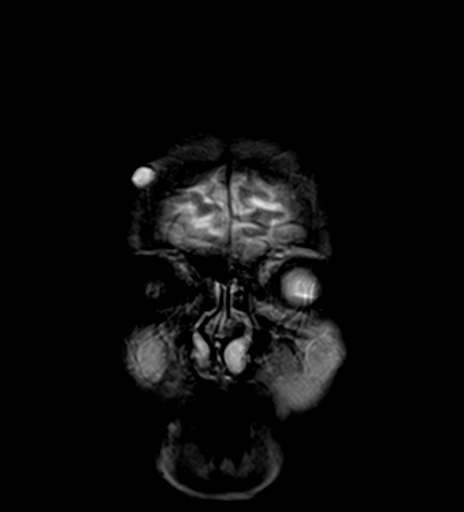
[im 13/26]
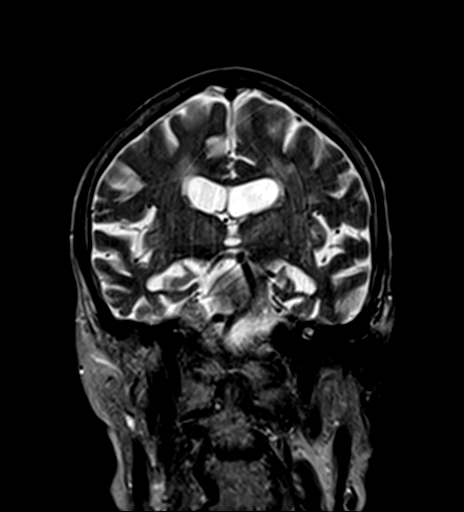
[im 26/26]
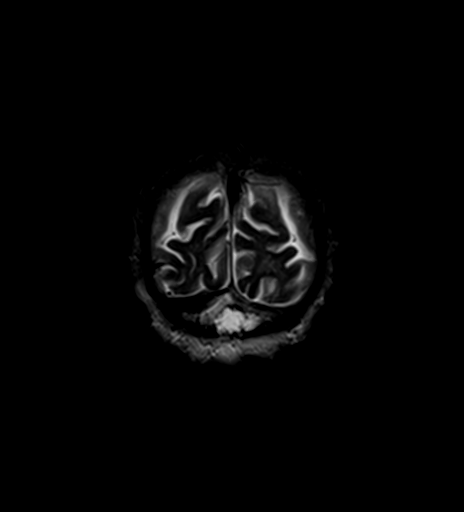

[48 of 48 positions shown; findings below may reference images not displayed]

FINDINGS: The study is intermittently mildly motion degraded. An axial T2
weighted sequence was not performed.

Brain: No acute infarct, mass, midline shift, or extra-axial fluid
collection is identified. Chronic microhemorrhages are noted in the
left centrum semiovale, posterior left temporal lobe, and
cerebellum. Patchy to confluent T2 hyperintensities in the cerebral
white matter and pons are nonspecific but compatible with moderately
severe chronic small vessel ischemic disease. There are small
chronic bilateral cerebellar infarcts. There is moderate generalized
cerebral atrophy.

Vascular: Major intracranial vascular flow voids are preserved.

Skull and upper cervical spine: No suspicious marrow lesion.
Interbody ankylosis at C3-4.

Sinuses/Orbits: No suspicious orbital abnormality. Paranasal sinuses
and mastoid air cells are clear.

Other: 1 cm T2 hyperintense lesion in the right frontal scalp,
possibly a sebaceous cyst or similar.
IMPRESSION: 1. No acute intracranial abnormality.
2. Moderately severe chronic small vessel ischemic disease.
3. Chronic cerebellar infarcts.

## 2021-04-24 DIAGNOSIS — M5030 Other cervical disc degeneration, unspecified cervical region: Secondary | ICD-10-CM | POA: Diagnosis not present

## 2021-04-24 DIAGNOSIS — F0390 Unspecified dementia without behavioral disturbance: Secondary | ICD-10-CM | POA: Diagnosis not present

## 2021-04-24 DIAGNOSIS — F419 Anxiety disorder, unspecified: Secondary | ICD-10-CM | POA: Diagnosis not present

## 2021-04-24 DIAGNOSIS — I10 Essential (primary) hypertension: Secondary | ICD-10-CM | POA: Diagnosis not present

## 2021-04-25 DIAGNOSIS — E7849 Other hyperlipidemia: Secondary | ICD-10-CM | POA: Diagnosis not present

## 2021-04-25 DIAGNOSIS — D519 Vitamin B12 deficiency anemia, unspecified: Secondary | ICD-10-CM | POA: Diagnosis not present

## 2021-04-25 DIAGNOSIS — D518 Other vitamin B12 deficiency anemias: Secondary | ICD-10-CM | POA: Diagnosis not present

## 2021-04-25 DIAGNOSIS — E559 Vitamin D deficiency, unspecified: Secondary | ICD-10-CM | POA: Diagnosis not present

## 2021-04-25 DIAGNOSIS — Z79899 Other long term (current) drug therapy: Secondary | ICD-10-CM | POA: Diagnosis not present

## 2021-04-25 DIAGNOSIS — E119 Type 2 diabetes mellitus without complications: Secondary | ICD-10-CM | POA: Diagnosis not present

## 2021-05-06 DIAGNOSIS — E559 Vitamin D deficiency, unspecified: Secondary | ICD-10-CM | POA: Diagnosis not present

## 2021-05-06 DIAGNOSIS — D649 Anemia, unspecified: Secondary | ICD-10-CM | POA: Diagnosis not present

## 2021-05-06 DIAGNOSIS — Z79899 Other long term (current) drug therapy: Secondary | ICD-10-CM | POA: Diagnosis not present

## 2021-05-07 DIAGNOSIS — R159 Full incontinence of feces: Secondary | ICD-10-CM | POA: Diagnosis not present

## 2021-05-07 DIAGNOSIS — R32 Unspecified urinary incontinence: Secondary | ICD-10-CM | POA: Diagnosis not present

## 2021-05-20 DIAGNOSIS — D6489 Other specified anemias: Secondary | ICD-10-CM | POA: Diagnosis not present

## 2021-05-20 DIAGNOSIS — Y998 Other external cause status: Secondary | ICD-10-CM | POA: Diagnosis not present

## 2021-05-20 DIAGNOSIS — E876 Hypokalemia: Secondary | ICD-10-CM | POA: Diagnosis not present

## 2021-05-20 DIAGNOSIS — X58XXXA Exposure to other specified factors, initial encounter: Secondary | ICD-10-CM | POA: Diagnosis not present

## 2021-05-20 DIAGNOSIS — R404 Transient alteration of awareness: Secondary | ICD-10-CM | POA: Diagnosis not present

## 2021-05-20 DIAGNOSIS — S7002XA Contusion of left hip, initial encounter: Secondary | ICD-10-CM | POA: Diagnosis not present

## 2021-05-20 DIAGNOSIS — M25552 Pain in left hip: Secondary | ICD-10-CM | POA: Diagnosis not present

## 2021-05-20 DIAGNOSIS — Z7901 Long term (current) use of anticoagulants: Secondary | ICD-10-CM | POA: Diagnosis not present

## 2021-05-22 DIAGNOSIS — E538 Deficiency of other specified B group vitamins: Secondary | ICD-10-CM | POA: Diagnosis not present

## 2021-05-22 DIAGNOSIS — F0390 Unspecified dementia without behavioral disturbance: Secondary | ICD-10-CM | POA: Diagnosis not present

## 2021-05-22 DIAGNOSIS — M5030 Other cervical disc degeneration, unspecified cervical region: Secondary | ICD-10-CM | POA: Diagnosis not present

## 2021-05-22 DIAGNOSIS — I10 Essential (primary) hypertension: Secondary | ICD-10-CM | POA: Diagnosis not present

## 2021-05-27 DIAGNOSIS — B86 Scabies: Secondary | ICD-10-CM | POA: Diagnosis not present

## 2021-05-27 DIAGNOSIS — S7002XA Contusion of left hip, initial encounter: Secondary | ICD-10-CM | POA: Diagnosis not present

## 2021-05-27 DIAGNOSIS — R296 Repeated falls: Secondary | ICD-10-CM | POA: Diagnosis not present

## 2021-06-04 DIAGNOSIS — R32 Unspecified urinary incontinence: Secondary | ICD-10-CM | POA: Diagnosis not present

## 2021-06-04 DIAGNOSIS — R159 Full incontinence of feces: Secondary | ICD-10-CM | POA: Diagnosis not present

## 2021-06-11 DIAGNOSIS — I10 Essential (primary) hypertension: Secondary | ICD-10-CM | POA: Diagnosis not present

## 2021-06-12 DIAGNOSIS — I10 Essential (primary) hypertension: Secondary | ICD-10-CM | POA: Diagnosis not present

## 2021-06-19 DIAGNOSIS — U071 COVID-19: Secondary | ICD-10-CM | POA: Diagnosis not present

## 2021-06-19 DIAGNOSIS — Z20828 Contact with and (suspected) exposure to other viral communicable diseases: Secondary | ICD-10-CM | POA: Diagnosis not present

## 2021-06-24 DIAGNOSIS — D649 Anemia, unspecified: Secondary | ICD-10-CM | POA: Diagnosis not present

## 2021-06-24 DIAGNOSIS — E559 Vitamin D deficiency, unspecified: Secondary | ICD-10-CM | POA: Diagnosis not present

## 2021-06-24 DIAGNOSIS — E538 Deficiency of other specified B group vitamins: Secondary | ICD-10-CM | POA: Diagnosis not present

## 2021-06-24 DIAGNOSIS — M5030 Other cervical disc degeneration, unspecified cervical region: Secondary | ICD-10-CM | POA: Diagnosis not present

## 2021-06-25 DIAGNOSIS — Z20828 Contact with and (suspected) exposure to other viral communicable diseases: Secondary | ICD-10-CM | POA: Diagnosis not present

## 2021-06-25 DIAGNOSIS — U071 COVID-19: Secondary | ICD-10-CM | POA: Diagnosis not present

## 2021-07-07 DIAGNOSIS — R32 Unspecified urinary incontinence: Secondary | ICD-10-CM | POA: Diagnosis not present

## 2021-07-07 DIAGNOSIS — R159 Full incontinence of feces: Secondary | ICD-10-CM | POA: Diagnosis not present

## 2021-07-08 DIAGNOSIS — Z20828 Contact with and (suspected) exposure to other viral communicable diseases: Secondary | ICD-10-CM | POA: Diagnosis not present

## 2021-07-08 DIAGNOSIS — U071 COVID-19: Secondary | ICD-10-CM | POA: Diagnosis not present

## 2021-07-23 DIAGNOSIS — E538 Deficiency of other specified B group vitamins: Secondary | ICD-10-CM | POA: Diagnosis not present

## 2021-07-23 DIAGNOSIS — M5030 Other cervical disc degeneration, unspecified cervical region: Secondary | ICD-10-CM | POA: Diagnosis not present

## 2021-07-23 DIAGNOSIS — E559 Vitamin D deficiency, unspecified: Secondary | ICD-10-CM | POA: Diagnosis not present

## 2021-07-23 DIAGNOSIS — D649 Anemia, unspecified: Secondary | ICD-10-CM | POA: Diagnosis not present

## 2021-08-04 DIAGNOSIS — R159 Full incontinence of feces: Secondary | ICD-10-CM | POA: Diagnosis not present

## 2021-08-04 DIAGNOSIS — R32 Unspecified urinary incontinence: Secondary | ICD-10-CM | POA: Diagnosis not present

## 2021-08-07 DIAGNOSIS — I10 Essential (primary) hypertension: Secondary | ICD-10-CM | POA: Diagnosis not present

## 2021-08-11 DIAGNOSIS — F331 Major depressive disorder, recurrent, moderate: Secondary | ICD-10-CM | POA: Diagnosis not present

## 2021-08-11 DIAGNOSIS — F411 Generalized anxiety disorder: Secondary | ICD-10-CM | POA: Diagnosis not present

## 2021-08-11 DIAGNOSIS — F03918 Unspecified dementia, unspecified severity, with other behavioral disturbance: Secondary | ICD-10-CM | POA: Diagnosis not present

## 2021-08-12 DIAGNOSIS — R0989 Other specified symptoms and signs involving the circulatory and respiratory systems: Secondary | ICD-10-CM | POA: Diagnosis not present

## 2021-08-12 DIAGNOSIS — I6523 Occlusion and stenosis of bilateral carotid arteries: Secondary | ICD-10-CM | POA: Diagnosis not present

## 2021-08-15 DIAGNOSIS — R590 Localized enlarged lymph nodes: Secondary | ICD-10-CM | POA: Diagnosis not present

## 2021-08-19 DIAGNOSIS — M79601 Pain in right arm: Secondary | ICD-10-CM | POA: Diagnosis not present

## 2021-08-19 DIAGNOSIS — R2231 Localized swelling, mass and lump, right upper limb: Secondary | ICD-10-CM | POA: Diagnosis not present

## 2021-08-20 DIAGNOSIS — H3530 Unspecified macular degeneration: Secondary | ICD-10-CM | POA: Diagnosis not present

## 2021-08-20 DIAGNOSIS — Z961 Presence of intraocular lens: Secondary | ICD-10-CM | POA: Diagnosis not present

## 2021-08-21 DIAGNOSIS — M5030 Other cervical disc degeneration, unspecified cervical region: Secondary | ICD-10-CM | POA: Diagnosis not present

## 2021-08-21 DIAGNOSIS — E538 Deficiency of other specified B group vitamins: Secondary | ICD-10-CM | POA: Diagnosis not present

## 2021-08-21 DIAGNOSIS — D649 Anemia, unspecified: Secondary | ICD-10-CM | POA: Diagnosis not present

## 2021-08-21 DIAGNOSIS — E559 Vitamin D deficiency, unspecified: Secondary | ICD-10-CM | POA: Diagnosis not present

## 2021-08-24 DIAGNOSIS — R222 Localized swelling, mass and lump, trunk: Secondary | ICD-10-CM | POA: Diagnosis not present

## 2021-08-24 DIAGNOSIS — D219 Benign neoplasm of connective and other soft tissue, unspecified: Secondary | ICD-10-CM | POA: Diagnosis not present

## 2021-08-26 DIAGNOSIS — I77811 Abdominal aortic ectasia: Secondary | ICD-10-CM | POA: Diagnosis not present

## 2021-08-28 DIAGNOSIS — Z79899 Other long term (current) drug therapy: Secondary | ICD-10-CM | POA: Diagnosis not present

## 2021-08-28 DIAGNOSIS — E559 Vitamin D deficiency, unspecified: Secondary | ICD-10-CM | POA: Diagnosis not present

## 2021-08-28 DIAGNOSIS — D519 Vitamin B12 deficiency anemia, unspecified: Secondary | ICD-10-CM | POA: Diagnosis not present

## 2021-08-28 DIAGNOSIS — E7849 Other hyperlipidemia: Secondary | ICD-10-CM | POA: Diagnosis not present

## 2021-08-28 DIAGNOSIS — E119 Type 2 diabetes mellitus without complications: Secondary | ICD-10-CM | POA: Diagnosis not present

## 2021-08-28 DIAGNOSIS — D518 Other vitamin B12 deficiency anemias: Secondary | ICD-10-CM | POA: Diagnosis not present

## 2021-08-29 DIAGNOSIS — I2584 Coronary atherosclerosis due to calcified coronary lesion: Secondary | ICD-10-CM | POA: Diagnosis not present

## 2021-08-29 DIAGNOSIS — J320 Chronic maxillary sinusitis: Secondary | ICD-10-CM | POA: Diagnosis not present

## 2021-08-29 DIAGNOSIS — Y998 Other external cause status: Secondary | ICD-10-CM | POA: Diagnosis not present

## 2021-08-29 DIAGNOSIS — R8290 Unspecified abnormal findings in urine: Secondary | ICD-10-CM | POA: Diagnosis not present

## 2021-08-29 DIAGNOSIS — D649 Anemia, unspecified: Secondary | ICD-10-CM | POA: Diagnosis not present

## 2021-08-29 DIAGNOSIS — R404 Transient alteration of awareness: Secondary | ICD-10-CM | POA: Diagnosis not present

## 2021-08-29 DIAGNOSIS — S0990XA Unspecified injury of head, initial encounter: Secondary | ICD-10-CM | POA: Diagnosis not present

## 2021-08-29 DIAGNOSIS — R221 Localized swelling, mass and lump, neck: Secondary | ICD-10-CM | POA: Diagnosis not present

## 2021-08-29 DIAGNOSIS — I700 Atherosclerosis of aorta: Secondary | ICD-10-CM | POA: Diagnosis not present

## 2021-08-29 DIAGNOSIS — R1310 Dysphagia, unspecified: Secondary | ICD-10-CM | POA: Diagnosis not present

## 2021-08-29 DIAGNOSIS — S022XXA Fracture of nasal bones, initial encounter for closed fracture: Secondary | ICD-10-CM | POA: Diagnosis not present

## 2021-08-29 DIAGNOSIS — K4090 Unilateral inguinal hernia, without obstruction or gangrene, not specified as recurrent: Secondary | ICD-10-CM | POA: Diagnosis not present

## 2021-08-29 DIAGNOSIS — J90 Pleural effusion, not elsewhere classified: Secondary | ICD-10-CM | POA: Diagnosis not present

## 2021-08-29 DIAGNOSIS — R0902 Hypoxemia: Secondary | ICD-10-CM | POA: Diagnosis not present

## 2021-08-29 DIAGNOSIS — I10 Essential (primary) hypertension: Secondary | ICD-10-CM | POA: Diagnosis not present

## 2021-08-29 DIAGNOSIS — I7123 Aneurysm of the descending thoracic aorta, without rupture: Secondary | ICD-10-CM | POA: Diagnosis not present

## 2021-08-29 DIAGNOSIS — Z043 Encounter for examination and observation following other accident: Secondary | ICD-10-CM | POA: Diagnosis not present

## 2021-08-29 DIAGNOSIS — W1839XA Other fall on same level, initial encounter: Secondary | ICD-10-CM | POA: Diagnosis not present

## 2021-08-29 DIAGNOSIS — S0003XA Contusion of scalp, initial encounter: Secondary | ICD-10-CM | POA: Diagnosis not present

## 2021-09-01 DIAGNOSIS — R32 Unspecified urinary incontinence: Secondary | ICD-10-CM | POA: Diagnosis not present

## 2021-09-01 DIAGNOSIS — R159 Full incontinence of feces: Secondary | ICD-10-CM | POA: Diagnosis not present

## 2021-09-03 DIAGNOSIS — F03918 Unspecified dementia, unspecified severity, with other behavioral disturbance: Secondary | ICD-10-CM | POA: Diagnosis not present

## 2021-09-03 DIAGNOSIS — F331 Major depressive disorder, recurrent, moderate: Secondary | ICD-10-CM | POA: Diagnosis not present

## 2021-09-03 DIAGNOSIS — F411 Generalized anxiety disorder: Secondary | ICD-10-CM | POA: Diagnosis not present

## 2021-09-08 DIAGNOSIS — S0083XA Contusion of other part of head, initial encounter: Secondary | ICD-10-CM | POA: Diagnosis not present

## 2021-09-08 DIAGNOSIS — R296 Repeated falls: Secondary | ICD-10-CM | POA: Diagnosis not present

## 2021-09-09 DIAGNOSIS — I10 Essential (primary) hypertension: Secondary | ICD-10-CM | POA: Diagnosis not present

## 2021-10-02 DIAGNOSIS — R159 Full incontinence of feces: Secondary | ICD-10-CM | POA: Diagnosis not present

## 2021-10-02 DIAGNOSIS — R32 Unspecified urinary incontinence: Secondary | ICD-10-CM | POA: Diagnosis not present

## 2021-10-08 DIAGNOSIS — I10 Essential (primary) hypertension: Secondary | ICD-10-CM | POA: Diagnosis not present

## 2021-10-09 DIAGNOSIS — M5030 Other cervical disc degeneration, unspecified cervical region: Secondary | ICD-10-CM | POA: Diagnosis not present

## 2021-10-09 DIAGNOSIS — F331 Major depressive disorder, recurrent, moderate: Secondary | ICD-10-CM | POA: Diagnosis not present

## 2021-10-09 DIAGNOSIS — D649 Anemia, unspecified: Secondary | ICD-10-CM | POA: Diagnosis not present

## 2021-10-09 DIAGNOSIS — F411 Generalized anxiety disorder: Secondary | ICD-10-CM | POA: Diagnosis not present

## 2021-10-09 DIAGNOSIS — E559 Vitamin D deficiency, unspecified: Secondary | ICD-10-CM | POA: Diagnosis not present

## 2021-10-09 DIAGNOSIS — F03918 Unspecified dementia, unspecified severity, with other behavioral disturbance: Secondary | ICD-10-CM | POA: Diagnosis not present

## 2021-10-09 DIAGNOSIS — E538 Deficiency of other specified B group vitamins: Secondary | ICD-10-CM | POA: Diagnosis not present

## 2021-11-03 DIAGNOSIS — R32 Unspecified urinary incontinence: Secondary | ICD-10-CM | POA: Diagnosis not present

## 2021-11-03 DIAGNOSIS — R159 Full incontinence of feces: Secondary | ICD-10-CM | POA: Diagnosis not present

## 2021-11-04 DIAGNOSIS — U071 COVID-19: Secondary | ICD-10-CM | POA: Diagnosis not present

## 2021-11-04 DIAGNOSIS — Z20828 Contact with and (suspected) exposure to other viral communicable diseases: Secondary | ICD-10-CM | POA: Diagnosis not present

## 2021-11-11 DIAGNOSIS — I10 Essential (primary) hypertension: Secondary | ICD-10-CM | POA: Diagnosis not present

## 2021-11-21 DIAGNOSIS — D518 Other vitamin B12 deficiency anemias: Secondary | ICD-10-CM | POA: Diagnosis not present

## 2021-11-21 DIAGNOSIS — E119 Type 2 diabetes mellitus without complications: Secondary | ICD-10-CM | POA: Diagnosis not present

## 2021-11-21 DIAGNOSIS — E7849 Other hyperlipidemia: Secondary | ICD-10-CM | POA: Diagnosis not present

## 2021-11-21 DIAGNOSIS — Z79899 Other long term (current) drug therapy: Secondary | ICD-10-CM | POA: Diagnosis not present

## 2021-12-10 DEATH — deceased
